# Patient Record
Sex: Female | Born: 1945 | Race: White | Hispanic: No | Marital: Married | Smoking: Former smoker
Health system: Southern US, Community
[De-identification: ages and names within clinical notes are randomized; demographics above are authoritative.]

## PROBLEM LIST (undated history)

## (undated) DIAGNOSIS — R413 Other amnesia: Secondary | ICD-10-CM

## (undated) DIAGNOSIS — F32A Depression, unspecified: Secondary | ICD-10-CM

## (undated) DIAGNOSIS — F329 Major depressive disorder, single episode, unspecified: Secondary | ICD-10-CM

## (undated) DIAGNOSIS — I10 Essential (primary) hypertension: Secondary | ICD-10-CM

## (undated) DIAGNOSIS — E785 Hyperlipidemia, unspecified: Secondary | ICD-10-CM

## (undated) DIAGNOSIS — N189 Chronic kidney disease, unspecified: Secondary | ICD-10-CM

## (undated) DIAGNOSIS — I639 Cerebral infarction, unspecified: Secondary | ICD-10-CM

## (undated) HISTORY — PX: CHOLECYSTECTOMY: SHX55

## (undated) HISTORY — PX: CATARACT EXTRACTION: SUR2

## (undated) HISTORY — PX: ANKLE ARTHODESIS W/ ARTHROSCOPY: SUR63

## (undated) HISTORY — PX: WRIST FRACTURE SURGERY: SHX121

## (undated) HISTORY — PX: ABDOMINAL HYSTERECTOMY: SHX81

---

## 1999-06-19 ENCOUNTER — Encounter: Admission: RE | Admit: 1999-06-19 | Discharge: 1999-09-17 | Payer: Self-pay | Admitting: Endocrinology

## 2000-11-17 ENCOUNTER — Other Ambulatory Visit: Admission: RE | Admit: 2000-11-17 | Discharge: 2000-11-17 | Payer: Self-pay | Admitting: Endocrinology

## 2003-06-24 ENCOUNTER — Ambulatory Visit (HOSPITAL_COMMUNITY): Admission: RE | Admit: 2003-06-24 | Discharge: 2003-06-24 | Payer: Self-pay | Admitting: Endocrinology

## 2003-09-12 ENCOUNTER — Encounter: Admission: RE | Admit: 2003-09-12 | Discharge: 2003-09-12 | Payer: Self-pay | Admitting: Endocrinology

## 2004-02-13 ENCOUNTER — Ambulatory Visit (HOSPITAL_COMMUNITY): Admission: RE | Admit: 2004-02-13 | Discharge: 2004-02-13 | Payer: Self-pay | Admitting: Orthopedic Surgery

## 2004-02-13 ENCOUNTER — Ambulatory Visit (HOSPITAL_BASED_OUTPATIENT_CLINIC_OR_DEPARTMENT_OTHER): Admission: RE | Admit: 2004-02-13 | Discharge: 2004-02-13 | Payer: Self-pay | Admitting: Orthopedic Surgery

## 2005-06-16 ENCOUNTER — Ambulatory Visit (HOSPITAL_COMMUNITY): Admission: RE | Admit: 2005-06-16 | Discharge: 2005-06-16 | Payer: Self-pay | Admitting: *Deleted

## 2005-06-16 ENCOUNTER — Encounter (INDEPENDENT_AMBULATORY_CARE_PROVIDER_SITE_OTHER): Payer: Self-pay | Admitting: Specialist

## 2005-09-22 ENCOUNTER — Encounter: Admission: RE | Admit: 2005-09-22 | Discharge: 2005-09-22 | Payer: Self-pay | Admitting: Endocrinology

## 2006-05-03 ENCOUNTER — Encounter: Admission: RE | Admit: 2006-05-03 | Discharge: 2006-08-01 | Payer: Self-pay | Admitting: Endocrinology

## 2006-09-13 ENCOUNTER — Encounter: Admission: RE | Admit: 2006-09-13 | Discharge: 2006-12-12 | Payer: Self-pay | Admitting: Endocrinology

## 2006-10-19 ENCOUNTER — Ambulatory Visit: Payer: Self-pay

## 2006-12-10 ENCOUNTER — Encounter: Admission: RE | Admit: 2006-12-10 | Discharge: 2006-12-10 | Payer: Self-pay | Admitting: Orthopedic Surgery

## 2008-05-07 ENCOUNTER — Encounter: Admission: RE | Admit: 2008-05-07 | Discharge: 2008-05-07 | Payer: Self-pay | Admitting: Endocrinology

## 2008-06-25 ENCOUNTER — Encounter (INDEPENDENT_AMBULATORY_CARE_PROVIDER_SITE_OTHER): Payer: Self-pay | Admitting: *Deleted

## 2008-06-25 ENCOUNTER — Ambulatory Visit (HOSPITAL_COMMUNITY): Admission: RE | Admit: 2008-06-25 | Discharge: 2008-06-25 | Payer: Self-pay | Admitting: *Deleted

## 2009-04-08 ENCOUNTER — Telehealth (INDEPENDENT_AMBULATORY_CARE_PROVIDER_SITE_OTHER): Payer: Self-pay | Admitting: *Deleted

## 2009-04-25 ENCOUNTER — Encounter: Admission: RE | Admit: 2009-04-25 | Discharge: 2009-04-25 | Payer: Self-pay | Admitting: Endocrinology

## 2009-05-13 ENCOUNTER — Other Ambulatory Visit: Admission: RE | Admit: 2009-05-13 | Discharge: 2009-05-13 | Payer: Self-pay | Admitting: Interventional Radiology

## 2009-05-13 ENCOUNTER — Encounter: Admission: RE | Admit: 2009-05-13 | Discharge: 2009-05-13 | Payer: Self-pay | Admitting: Endocrinology

## 2009-05-13 ENCOUNTER — Encounter (INDEPENDENT_AMBULATORY_CARE_PROVIDER_SITE_OTHER): Payer: Self-pay | Admitting: Interventional Radiology

## 2009-07-31 ENCOUNTER — Emergency Department (HOSPITAL_COMMUNITY): Admission: EM | Admit: 2009-07-31 | Discharge: 2009-07-31 | Payer: Self-pay | Admitting: Emergency Medicine

## 2009-08-06 ENCOUNTER — Ambulatory Visit (HOSPITAL_BASED_OUTPATIENT_CLINIC_OR_DEPARTMENT_OTHER): Admission: RE | Admit: 2009-08-06 | Discharge: 2009-08-06 | Payer: Self-pay | Admitting: Orthopedic Surgery

## 2009-08-14 ENCOUNTER — Inpatient Hospital Stay (HOSPITAL_COMMUNITY): Admission: EM | Admit: 2009-08-14 | Discharge: 2009-08-16 | Payer: Self-pay | Admitting: Emergency Medicine

## 2009-08-15 ENCOUNTER — Ambulatory Visit: Payer: Self-pay | Admitting: Vascular Surgery

## 2009-08-15 ENCOUNTER — Encounter (INDEPENDENT_AMBULATORY_CARE_PROVIDER_SITE_OTHER): Payer: Self-pay | Admitting: Internal Medicine

## 2010-03-24 ENCOUNTER — Encounter: Payer: Self-pay | Admitting: Cardiovascular Disease

## 2010-04-07 DIAGNOSIS — R079 Chest pain, unspecified: Secondary | ICD-10-CM

## 2010-04-07 DIAGNOSIS — E119 Type 2 diabetes mellitus without complications: Secondary | ICD-10-CM

## 2010-04-07 DIAGNOSIS — Z87898 Personal history of other specified conditions: Secondary | ICD-10-CM | POA: Insufficient documentation

## 2010-04-07 DIAGNOSIS — E785 Hyperlipidemia, unspecified: Secondary | ICD-10-CM | POA: Insufficient documentation

## 2010-04-07 DIAGNOSIS — M129 Arthropathy, unspecified: Secondary | ICD-10-CM | POA: Insufficient documentation

## 2010-04-07 DIAGNOSIS — G894 Chronic pain syndrome: Secondary | ICD-10-CM | POA: Insufficient documentation

## 2010-04-07 DIAGNOSIS — I10 Essential (primary) hypertension: Secondary | ICD-10-CM

## 2010-04-07 DIAGNOSIS — E669 Obesity, unspecified: Secondary | ICD-10-CM | POA: Insufficient documentation

## 2010-04-08 ENCOUNTER — Ambulatory Visit: Payer: Self-pay | Admitting: Cardiovascular Disease

## 2010-04-30 ENCOUNTER — Ambulatory Visit (HOSPITAL_BASED_OUTPATIENT_CLINIC_OR_DEPARTMENT_OTHER): Admission: RE | Admit: 2010-04-30 | Discharge: 2010-04-30 | Payer: Self-pay | Admitting: Orthopedic Surgery

## 2010-09-06 ENCOUNTER — Encounter: Payer: Self-pay | Admitting: Endocrinology

## 2010-09-06 ENCOUNTER — Encounter: Payer: Self-pay | Admitting: Orthopedic Surgery

## 2010-09-15 NOTE — Letter (Signed)
Summary: [facility] Progress Note  [facility] Progress Note   Imported By: Sallee Provencal 04/16/2010 14:19:55  _____________________________________________________________________  External Attachment:    Type:   Image     Comment:   External Document

## 2010-09-15 NOTE — Assessment & Plan Note (Signed)
Summary: NP6/SURGICAL CLEARANCE/JML   Visit Type:  Pre-op Evaluation/NP6 Primary Provider:  dr Wilson Singer  CC:  no cardiac complaints.  History of Present Illness: 65 yo WF with history of DM, HTN and hyperlipidemia here today for pre-operative risk assessment prior to planned left wrist surgery. She tells me that she broke her wrist in December of 2010 but had treatment with a splint. When leaving the surgeons office, she slipped on the ice and broke her left ankle. This led to an open reduction and internal fixation of the left ankle later in December. She was admitted to [facility] on August 14, 2010 with complaints of chest pain. She was seen by Dr. Doreatha Lew of [facility]. Cardiac enzymes were negative and EKG was without ischemic changes. Plan at time of discharge was for an outpatient stress test as the pain was felt to be atypical and likely non-cardiac. She cancelled the stress test appointment since she had no recurrent pain.   She has felt well since then without any chest pain, SOB, palpitations, near syncope or syncope. She has no prior cardiac problems. There is no family history of CAD. She has continued to have pain in her left wrist and has seen Dr. Percell Miller who is planning open surgery on her wrist.   Current Medications (verified): 1)  Crestor 10 Mg Tabs (Rosuvastatin Calcium) .Marland Kitchen.. 1 Tab Once Daily 2)  Diovan 40 Mg Tabs (Valsartan) .... Take One Tablet By Mouth Daily 3)  Topamax 25 Mg Tabs (Topiramate) .... 2 Tabs Qd 4)  Humulin 50/50 50-50 % Susp (Insulin Isophane & Regular) .... As Directed 5)  Aspirin 81 Mg Tbec (Aspirin) .... Take One Tablet By Mouth Daily  Allergies (verified): No Known Drug Allergies  Past History:  Past Medical History: Reviewed history from 04/07/2010 and no changes required. ARTHRITIS, CHRONIC (ICD-716.90) MIGRAINES, HX OF (ICD-V13.8) CHRONIC PAIN SYNDROME (ICD-338.4) OBESITY (ICD-278.00) HYPERLIPIDEMIA (ICD-272.4) HYPERTENSION  (ICD-401.9) DIABETES MELLITUS, TYPE II (ICD-250.00) CHEST PAIN (ICD-786.50)  Past Surgical History: Reviewed history from 04/07/2010 and no changes required. 1. Comminuted fracture of the fibula status post open reduction       internal fixation with screw.   2. Right knee surgery.   3. EGD and colonoscopy findings unknown.   4. Cholecystectomy.      Family History: Mother-deceased, lung cancer Father-deceased, ruptured esophagus NO CAD     Social History:  No tobacco No alcohol No illicit drugs Married, 2 children  Review of Systems  The patient denies fatigue, malaise, fever, weight gain/loss, vision loss, decreased hearing, hoarseness, chest pain, palpitations, shortness of breath, prolonged cough, wheezing, sleep apnea, coughing up blood, abdominal pain, blood in stool, nausea, vomiting, diarrhea, heartburn, incontinence, blood in urine, muscle weakness, joint pain, leg swelling, rash, skin lesions, headache, fainting, dizziness, depression, anxiety, enlarged lymph nodes, easy bruising or bleeding, and environmental allergies.    Vital Signs:  Patient profile:   65 year old female Height:      65 inches Weight:      213 pounds BMI:     35.57 Pulse rate:   78 / minute BP sitting:   150 / 80  (left arm) Cuff size:   large  Vitals Entered By: Lubertha Basque, CNA (April 08, 2010 11:27 AM)  Physical Exam  General:  General: Well developed, well nourished, NAD HEENT: OP clear, mucus membranes moist SKIN: warm, dry Neuro: No focal deficits Musculoskeletal: Muscle strength 5/5 all ext Psychiatric: Mood and affect normal Neck: No JVD, no  carotid bruits, no thyromegaly, no lymphadenopathy. Lungs:Clear bilaterally, no wheezes, rhonci, crackles CV: RRR no murmurs, gallops rubs Abdomen: soft, NT, ND, BS present Extremities: No edema, pulses 2+.    EKG  Procedure date:  04/08/2010  Findings:      Normal sinus rhythm, rate 78 bpm. LAFB.  Impression &  Recommendations:  Problem # 1:  PRE-OPERATIVE CARDIAC EXAM (ICD-V72.81) She has no complaints of chest pain or shortness of breath. She has several risk factors for CAD but no active symptoms. She has no prior history of CAD or tobacc abuse, no family history of CAD. Based on current guidelines, there is no indication for cardiac workup prior to the planned surgical procedure which is a low risk procedure. I would recommend proceeding with surgery without cardiac workup.   Her updated medication list for this problem includes:    Aspirin 81 Mg Tbec (Aspirin) .Marland Kitchen... Take one tablet by mouth daily  Patient Instructions: 1)  Your physician recommends that you schedule a follow-up appointment as needed 2)  Your physician recommends that you continue on your current medications as directed. Please refer to the Current Medication list given to you today.

## 2010-09-30 ENCOUNTER — Encounter: Payer: Self-pay | Admitting: Cardiovascular Disease

## 2010-10-07 NOTE — Miscellaneous (Signed)
Summary: Orders Update  Clinical Lists Changes  Orders: Added new Test order of Renal Artery Duplex (Renal Artery Duplex) - Signed 

## 2010-10-29 LAB — POCT HEMOGLOBIN-HEMACUE: Hemoglobin: 13.6 g/dL (ref 12.0–15.0)

## 2010-10-29 LAB — GLUCOSE, CAPILLARY
Glucose-Capillary: 102 mg/dL — ABNORMAL HIGH (ref 70–99)
Glucose-Capillary: 109 mg/dL — ABNORMAL HIGH (ref 70–99)

## 2010-11-01 LAB — CBC
HCT: 28.5 % — ABNORMAL LOW (ref 36.0–46.0)
Hemoglobin: 9.5 g/dL — ABNORMAL LOW (ref 12.0–15.0)
MCHC: 33.3 g/dL (ref 30.0–36.0)
MCV: 87.1 fL (ref 78.0–100.0)
Platelets: 202 10*3/uL (ref 150–400)
RBC: 3.27 MIL/uL — ABNORMAL LOW (ref 3.87–5.11)
RDW: 14.1 % (ref 11.5–15.5)
WBC: 5.6 10*3/uL (ref 4.0–10.5)

## 2010-11-01 LAB — GLUCOSE, CAPILLARY
Glucose-Capillary: 201 mg/dL — ABNORMAL HIGH (ref 70–99)
Glucose-Capillary: 253 mg/dL — ABNORMAL HIGH (ref 70–99)

## 2010-11-16 LAB — BASIC METABOLIC PANEL
BUN: 20 mg/dL (ref 6–23)
CO2: 24 mEq/L (ref 19–32)
Calcium: 8.8 mg/dL (ref 8.4–10.5)
Chloride: 96 mEq/L (ref 96–112)
Creatinine, Ser: 1.25 mg/dL — ABNORMAL HIGH (ref 0.4–1.2)
GFR calc Af Amer: 52 mL/min — ABNORMAL LOW (ref >60)
GFR calc non Af Amer: 43 mL/min — ABNORMAL LOW (ref >60)
Glucose, Bld: 519 mg/dL (ref 70–99)
Potassium: 4.6 mEq/L (ref 3.5–5.1)
Sodium: 132 mEq/L — ABNORMAL LOW (ref 135–145)

## 2010-11-16 LAB — DIFFERENTIAL
Eosinophils Absolute: 0.2 10*3/uL (ref 0.0–0.7)
Eosinophils Relative: 3 % (ref 0–5)
Lymphs Abs: 1.3 10*3/uL (ref 0.7–4.0)
Monocytes Absolute: 0.4 10*3/uL (ref 0.1–1.0)
Monocytes Relative: 6 % (ref 3–12)

## 2010-11-16 LAB — GLUCOSE, CAPILLARY
Glucose-Capillary: 181 mg/dL — ABNORMAL HIGH (ref 70–99)
Glucose-Capillary: 256 mg/dL — ABNORMAL HIGH (ref 70–99)
Glucose-Capillary: 265 mg/dL — ABNORMAL HIGH (ref 70–99)
Glucose-Capillary: 313 mg/dL — ABNORMAL HIGH (ref 70–99)
Glucose-Capillary: 331 mg/dL — ABNORMAL HIGH (ref 70–99)
Glucose-Capillary: 394 mg/dL — ABNORMAL HIGH (ref 70–99)
Glucose-Capillary: 400 mg/dL — ABNORMAL HIGH (ref 70–99)

## 2010-11-16 LAB — POCT I-STAT, CHEM 8
BUN: 28 mg/dL — ABNORMAL HIGH (ref 6–23)
Calcium, Ion: 1.15 mmol/L (ref 1.12–1.32)
Chloride: 103 mEq/L (ref 96–112)
Creatinine, Ser: 1.2 mg/dL (ref 0.4–1.2)
Glucose, Bld: 291 mg/dL — ABNORMAL HIGH (ref 70–99)
HCT: 36 % (ref 36.0–46.0)
Hemoglobin: 12.2 g/dL (ref 12.0–15.0)
Potassium: 4.6 mEq/L (ref 3.5–5.1)
Sodium: 133 mEq/L — ABNORMAL LOW (ref 135–145)
TCO2: 24 mmol/L (ref 0–100)

## 2010-11-16 LAB — URINALYSIS, ROUTINE W REFLEX MICROSCOPIC
Bilirubin Urine: NEGATIVE
Glucose, UA: >1000 mg/dL — AB
Ketones, ur: NEGATIVE mg/dL
Leukocytes, UA: NEGATIVE
Nitrite: NEGATIVE
Specific Gravity, Urine: >1.046 — ABNORMAL HIGH (ref 1.005–1.030)
pH: 5.5 (ref 5.0–8.0)

## 2010-11-16 LAB — POCT CARDIAC MARKERS
CKMB, poc: 1.2 ng/mL (ref 1.0–8.0)
CKMB, poc: 1.7 ng/mL (ref 1.0–8.0)
Myoglobin, poc: 214 ng/mL (ref 12–200)
Troponin i, poc: <0.05 ng/mL (ref 0.00–0.09)
Troponin i, poc: <0.05 ng/mL (ref 0.00–0.09)

## 2010-11-16 LAB — CBC
HCT: 32.6 % — ABNORMAL LOW (ref 36.0–46.0)
Hemoglobin: 11.1 g/dL — ABNORMAL LOW (ref 12.0–15.0)
MCHC: 34 g/dL (ref 30.0–36.0)
MCV: 86.7 fL (ref 78.0–100.0)
MCV: 86.7 fL (ref 78.0–100.0)
Platelets: 238 10*3/uL (ref 150–400)
Platelets: 256 10*3/uL (ref 150–400)
RBC: 3.61 MIL/uL — ABNORMAL LOW (ref 3.87–5.11)
RBC: 3.76 MIL/uL — ABNORMAL LOW (ref 3.87–5.11)
RDW: 14 % (ref 11.5–15.5)
WBC: 6.4 10*3/uL (ref 4.0–10.5)
WBC: 6.4 10*3/uL (ref 4.0–10.5)

## 2010-11-16 LAB — HEMOGLOBIN A1C
Hgb A1c MFr Bld: 10.8 % — ABNORMAL HIGH (ref 4.6–6.1)
Mean Plasma Glucose: 263 mg/dL

## 2010-11-16 LAB — URINE MICROSCOPIC-ADD ON

## 2010-11-16 LAB — URINE CULTURE: Colony Count: >=100000

## 2010-11-16 LAB — CARDIAC PANEL(CRET KIN+CKTOT+MB+TROPI)
CK, MB: 1.2 ng/mL (ref 0.3–4.0)
CK, MB: 1.2 ng/mL (ref 0.3–4.0)
Relative Index: INVALID (ref 0.0–2.5)
Total CK: 84 U/L (ref 7–177)
Troponin I: 0.01 ng/mL (ref 0.00–0.06)
Troponin I: 0.01 ng/mL (ref 0.00–0.06)

## 2010-12-29 NOTE — Op Note (Signed)
NAME:  Tamara Fry, POZO                    ACCOUNT NO.:  [id number]   MEDICAL RECORD NO.:  [id number]          PATIENT TYPE:  AMB   LOCATION:  ENDO                         FACILITY:  [facility]   PHYSICIAN:  Waverly Ferrari, M.D.    DATE OF BIRTH:  10-Jan-1946   DATE OF PROCEDURE:  06/25/2008  DATE OF DISCHARGE:                               OPERATIVE REPORT   PROCEDURE:  Colonoscopy.   INDICATIONS:  Hemoccult positivity.   ANESTHESIA:  Fentanyl 50 mcg, Versed 3 mg.   DESCRIPTION OF PROCEDURE:  With the patient mildly sedated in the left  lateral decubitus position the Pentax videoscopic colonoscope was  inserted into the rectum and passed under direct vision with pressure  applied to reach the cecum identified by ileocecal valve and appendiceal  orifice both of which were photographed.  From this point the  colonoscope was slowly withdrawn taking circumferential views of colonic  mucosa stopping in the rectum which appeared normal on direct and showed  hemorrhoids on retroflex view.  The endoscope was straightened and  withdrawn.  The patient's vital signs and pulse oximeter remained  stable.  The patient tolerated the procedure well without apparent  complications.   FINDINGS:  Internal hemorrhoids, otherwise unremarkable examination.   PLAN:  Consider repeat examination in 5-10 years.  See endoscopy note  for further details.           ______________________________  Waverly Ferrari, M.D.     GMO/MEDQ  D:  06/25/2008  T:  06/25/2008  Job:  [id number]

## 2010-12-29 NOTE — Op Note (Signed)
NAME:  Tamara Fry, Tamara Fry                    ACCOUNT NO.:  [id number]   MEDICAL RECORD NO.:  [id number]          PATIENT TYPE:  AMB   LOCATION:  ENDO                         FACILITY:  [facility]   PHYSICIAN:  Waverly Ferrari, M.D.    DATE OF BIRTH:  August 07, 1946   DATE OF PROCEDURE:  DATE OF DISCHARGE:                               OPERATIVE REPORT   PROCEDURE:  Upper endoscopy with biopsy.   INDICATIONS:  Hemoccult positivity.   ANESTHESIA:  Fentanyl 25 mcg, Versed 6 mg.   PROCEDURE:  With the patient mildly sedated in the left lateral  decubitus position, the Pentax videoscopic endoscope was inserted into  the mouth, passed under direct vision through the esophagus, which  appeared normal into the stomach.  The fundus appeared erythematous  diffusely, but body and antrum were spared.  Photographs were taken.  The duodenal bulb and second portion of duodenum were visualized and I  questioned scalloping of the second portion of duodenum, so I elected to  biopsy this.  From this point, the endoscope was slowly withdrawn,  taking circumferential views of duodenal mucosa until the endoscope had  been pulled back into stomach and placed in retroflexion to view the  stomach from below.  The endoscope was straightened and withdrawn,  taking biopsies of the fundus erythematous areas as we withdrew, taking  circumferential views of remaining gastric and esophageal mucosa.  The  patient's vital signs and pulse oximeter remained stable.  The patient  tolerated the procedure well without apparent complications.   FINDINGS:  Question of scalloping of the duodenum biopsied.  Erythematous changes of fundus of stomach biopsied.  Await biopsy  reports.  The patient will call me for results and follow up with me as  an outpatient.  Proceed to colonoscopy as planned.           ______________________________  Waverly Ferrari, M.D.     GMO/MEDQ  D:  06/25/2008  T:  06/25/2008  Job:  [id number]

## 2011-01-01 NOTE — Op Note (Signed)
NAME:  Tamara Fry, Tamara Fry                    ACCOUNT NO.:  [id number]   MEDICAL RECORD NO.:  [id number]          PATIENT TYPE:  AMB   LOCATION:  ENDO                         FACILITY[facility]   PHYSICIAN:  Waverly Ferrari, M.D.    DATE OF BIRTH:  12-19-1945   DATE OF PROCEDURE:  06/16/2005  DATE OF DISCHARGE:                                 OPERATIVE REPORT   PROCEDURE:  Upper endoscopy.   INDICATIONS:  Gastroesophageal reflux disease.   ANESTHESIA:  Demerol 60, Versed 6 mg.   DESCRIPTION OF PROCEDURE:  With the patient mildly sedated in the left  lateral decubitus position, the Olympus videoscopic endoscope was inserted  in the mouth and passed under direct vision through the esophagus which  appeared normal into the stomach. The fundus, body, antrum, duodenal bulb,  second portion of duodenum were visualized. From this point, the endoscope  was slowly withdrawn taking circumferential views of the duodenal mucosa  until the endoscope was then pulled back into the stomach placed in  retroflexion to view the stomach from below. The endoscope was straightened  and withdrawn taking circumferential views of the remaining gastric and  esophageal mucosa. The patient's vital signs and pulse oximeter remained  stable. The patient tolerated the procedure well without apparent  complications.   FINDINGS:  Unremarkable examination.   PLAN:  Proceed to colonoscopy.           ______________________________  Waverly Ferrari, M.D.     GMO/MEDQ  D:  06/16/2005  T:  06/16/2005  Job:  [id number]

## 2011-01-01 NOTE — Op Note (Signed)
NAME:  Tamara Fry, Tamara Fry                              ACCOUNT NO.:  [id number]   MEDICAL RECORD NO.:  [id number]                   PATIENT TYPE:  AMB   LOCATION:  DSC                                  FACILITY:  [facility]   PHYSICIAN:  Ninetta Lights, M.D.              DATE OF BIRTH:  [birth year]   DATE OF PROCEDURE:  02/13/2004  DATE OF DISCHARGE:                                 OPERATIVE REPORT   PREOPERATIVE DIAGNOSES:  Right knee lateral and medial meniscus tear with  chondromalacia of the patella.   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   PROCEDURE:  Right knee exam under anesthesia, arthroscopy, partial lateral  meniscectomy.  Removal of chondral fragments and osteochondral loose bodies.  Partial synovectomy, chondroplasty medial femoral condyle and patella.   SURGEON:  Ninetta Lights, M.D.   ASSISTANT:  Aaron Edelman D. Petrarca, P.A.-C.   ANESTHESIA:  General.   ESTIMATED BLOOD LOSS:  Minimal.   TOURNIQUET TIME:  Not employed.   SPECIMENS:  None.   CULTURES:  None.   COMPLICATIONS:  None.   DRESSING:  Sterile compressive.   DESCRIPTION OF PROCEDURE:  The patient was brought to the operating room,  placed on the operating table in supine position.  After adequate anesthesia  had been obtained, tourniquet and leg holder applied, leg prepped and draped  in the usual sterile fashion.  Three portals were created, one superolateral  and one each medial and lateral parapatellar.  Inflow catheter introduced,  knee distended, arthroscope introduced.  Marked proliferative reactive  synovitis throughout treated with synovectomy,  hemostasis cautery.  Grade 2  changes lateral patella and medial trochlea through the chondroplasty to a  stable surface. Some lateral tracking but did not tether significantly. The  cruciate ligament intact. The lateral  meniscus marked, complex displaced  tears at the anterior half. Taken out to the rim and tapered into the  remaining meniscus, hemostasis at the margin.  The lateral compartment had  minimal degenerative changes. Medially there were two osteochondral loose  bodies found posteromedially that were removed.  A focal 1 cm near full  thickness defect on the condyle very much on the margin __________ to a  stable surface.  The medial meniscus was thoroughly inspected and no tears  seen.  A little roughening on both the medial and tibial plateaus debrided.  At completion, all recess examined to be sure all loose bodies removed.  Instruments  and fluid removed.  Portals of the knee injected with Marcaine.  Portals  closed with 4-0 nylon. A sterile compressive dressing applied. Anesthesia  reversed.  Brought to the recovery room.  Tolerated surgery well with no  complications.  Ninetta Lights, M.D.    DFM/MEDQ  D:  02/13/2004  T:  02/13/2004  Job:  [id number]

## 2011-01-01 NOTE — Op Note (Signed)
NAME:  Tamara Fry, Tamara Fry                    ACCOUNT NO.:  [id number]   MEDICAL RECORD NO.:  [id number]          PATIENT TYPE:  AMB   LOCATION:  ENDO                         [facility]ACILITY:  [facility]   PHYSICIAN:  Waverly Ferrari, M.D.    DATE OF BIRTH:  [birth year]   DATE OF PROCEDURE:  06/16/2005  DATE OF DISCHARGE:                                 OPERATIVE REPORT   PROCEDURE:  Colonoscopy.   INDICATIONS:  Colon polyps.   ANESTHESIA:  Demerol 20, Versed 2 mg.   DESCRIPTION OF PROCEDURE:  With the patient mildly sedated in the left  lateral decubitus position, the Olympus videoscopic colonoscope was inserted  in the rectum and passed under direct vision to the cecum identified by the  ileocecal valve and appendiceal orifice both of which were photographed.  From this point, the colonoscope was slowly withdrawn taking circumferential  views of the colonic mucosa stopping in the rectum where a polyp was seen,  photographed and removed using hot biopsy forceps technique setting of  20/200 blended current. We next placed the endoscope in retroflexion to view  the anal canal from above. The endoscope was then straightened and  withdrawn. The patient's vital signs and pulse oximeter remained stable. The  patient tolerated the procedure well without apparent complications.   FINDINGS:  Internal hemorrhoids, polyp in the rectum. Await biopsy report.  The patient will call me for results and follow-up with me as an outpatient.           ______________________________  Waverly Ferrari, M.D.     GMO/MEDQ  D:  06/16/2005  T:  06/16/2005  Job:  [id number]

## 2011-03-15 ENCOUNTER — Emergency Department (HOSPITAL_BASED_OUTPATIENT_CLINIC_OR_DEPARTMENT_OTHER)
Admission: EM | Admit: 2011-03-15 | Discharge: 2011-03-15 | Disposition: A | Discharge status: Home / Self Care | Payer: Medicare Other | Attending: Emergency Medicine | Admitting: Emergency Medicine

## 2011-03-15 ENCOUNTER — Encounter: Payer: Self-pay | Admitting: *Deleted

## 2011-03-15 DIAGNOSIS — S90416A Abrasion, unspecified lesser toe(s), initial encounter: Secondary | ICD-10-CM

## 2011-03-15 DIAGNOSIS — E119 Type 2 diabetes mellitus without complications: Secondary | ICD-10-CM | POA: Insufficient documentation

## 2011-03-15 DIAGNOSIS — Y92009 Unspecified place in unspecified non-institutional (private) residence as the place of occurrence of the external cause: Secondary | ICD-10-CM | POA: Insufficient documentation

## 2011-03-15 DIAGNOSIS — IMO0002 Reserved for concepts with insufficient information to code with codable children: Secondary | ICD-10-CM | POA: Insufficient documentation

## 2011-03-15 DIAGNOSIS — I10 Essential (primary) hypertension: Secondary | ICD-10-CM | POA: Insufficient documentation

## 2011-03-15 DIAGNOSIS — W269XXA Contact with unspecified sharp object(s), initial encounter: Secondary | ICD-10-CM | POA: Insufficient documentation

## 2011-03-15 HISTORY — DX: Essential (primary) hypertension: I10

## 2011-03-15 MED ORDER — SULFAMETHOXAZOLE-TRIMETHOPRIM 800-160 MG PO TABS: 1.0000 | ORAL_TABLET | Freq: Two times a day (BID) | ORAL | Status: AC

## 2011-03-15 MED ORDER — HYDROCODONE-ACETAMINOPHEN 5-325 MG PO TABS: 2.0000 | ORAL_TABLET | ORAL | Status: AC | PRN

## 2011-03-15 NOTE — ED Provider Notes (Addendum)
History     Chief Complaint  Patient presents with  . Insect Bite   HPI Comments: Pt reports she was walking thru straw in a plant bed at [place] and she thinks something bite her.  Pt did not see a snake.  Pt is worried about her foot because she is diabetic,  Pt was wearing flip flops.  Patient is a 65 y.o. female presenting with toe pain. The history is provided by the patient.  Toe Pain This is a new problem. The current episode started today. The problem has been unchanged. Pertinent negatives include no joint swelling. The symptoms are aggravated by nothing. She has tried nothing for the symptoms. The treatment provided no relief.  Toe Pain This is a new problem. The current episode started today. The problem has been unchanged. The symptoms are aggravated by nothing. She has tried nothing for the symptoms. The treatment provided no relief.    Past Medical History  Diagnosis Date  . Diabetes mellitus   . Hypertension   . Migraine     Past Surgical History  Procedure Date  . Ankle arthodesis w/ arthroscopy   . Tonsillectomy   . Abdominal hysterectomy     History reviewed. No pertinent family history.  History  Substance Use Topics  . Smoking status: Never Smoker   . Smokeless tobacco: Not on file  . Alcohol Use: No    OB History    Grav Para Term Preterm Abortions TAB SAB Ect Mult Living                  Review of Systems  Musculoskeletal: Negative for joint swelling and gait problem.  Skin: Positive for wound.  All other systems reviewed and are negative.    Physical Exam  BP 156/76  Pulse 77  Temp(Src) 98.7 F (37.1 C) (Oral)  Resp 18  Wt 214 lb (97.07 kg)  SpO2 100%  Physical Exam  Constitutional: She appears well-developed and well-nourished.  HENT:  Head: Normocephalic.  Skin: Skin is warm. There is erythema.  Psychiatric: She has a normal mood and affect.    ED Course  Procedures  MDM Pt has 2 small abraided areas,  Probable superficial  abrasion,  I doubt bite,  No swelling (I doubt snake or insect)   Medical screening examination/treatment/procedure(s) were performed by non-physician practitioner and as supervising physician I was immediately available for consultation/collaboration. Katy Apo, M.D.    [place], [place] 03/15/11 [place] III, MD 03/16/11 [place], MD 04/23/11 [phone]

## 2011-03-15 NOTE — ED Notes (Signed)
Pt c/o insect bite right foot x 1 day.

## 2011-05-18 LAB — BASIC METABOLIC PANEL
BUN: 19
CO2: 22
Chloride: 100
Creatinine, Ser: 1.42 — ABNORMAL HIGH

## 2011-07-15 ENCOUNTER — Emergency Department (HOSPITAL_COMMUNITY): Payer: Medicare Other

## 2011-07-15 ENCOUNTER — Encounter (HOSPITAL_COMMUNITY): Payer: Self-pay | Admitting: Family Medicine

## 2011-07-15 ENCOUNTER — Emergency Department (HOSPITAL_COMMUNITY)
Admission: EM | Admit: 2011-07-15 | Discharge: 2011-07-15 | Disposition: A | Discharge status: Home / Self Care | Payer: Medicare Other | Attending: Emergency Medicine | Admitting: Emergency Medicine

## 2011-07-15 DIAGNOSIS — S8000XA Contusion of unspecified knee, initial encounter: Secondary | ICD-10-CM | POA: Insufficient documentation

## 2011-07-15 DIAGNOSIS — E860 Dehydration: Secondary | ICD-10-CM

## 2011-07-15 DIAGNOSIS — Z79899 Other long term (current) drug therapy: Secondary | ICD-10-CM | POA: Insufficient documentation

## 2011-07-15 DIAGNOSIS — R55 Syncope and collapse: Secondary | ICD-10-CM

## 2011-07-15 DIAGNOSIS — R5381 Other malaise: Secondary | ICD-10-CM | POA: Insufficient documentation

## 2011-07-15 DIAGNOSIS — S8001XA Contusion of right knee, initial encounter: Secondary | ICD-10-CM

## 2011-07-15 DIAGNOSIS — W19XXXA Unspecified fall, initial encounter: Secondary | ICD-10-CM | POA: Insufficient documentation

## 2011-07-15 DIAGNOSIS — R51 Headache: Secondary | ICD-10-CM | POA: Insufficient documentation

## 2011-07-15 DIAGNOSIS — E119 Type 2 diabetes mellitus without complications: Secondary | ICD-10-CM | POA: Insufficient documentation

## 2011-07-15 DIAGNOSIS — I10 Essential (primary) hypertension: Secondary | ICD-10-CM | POA: Insufficient documentation

## 2011-07-15 LAB — CBC
HCT: 38.7 % (ref 36.0–46.0)
MCH: 29.5 pg (ref 26.0–34.0)
MCHC: 32.6 g/dL (ref 30.0–36.0)
MCV: 90.6 fL (ref 78.0–100.0)
RDW: 13.8 % (ref 11.5–15.5)

## 2011-07-15 LAB — URINALYSIS, ROUTINE W REFLEX MICROSCOPIC
Glucose, UA: NEGATIVE mg/dL
Ketones, ur: NEGATIVE mg/dL
Leukocytes, UA: NEGATIVE
Protein, ur: NEGATIVE mg/dL

## 2011-07-15 LAB — DIFFERENTIAL
Basophils Absolute: 0 10*3/uL (ref 0.0–0.1)
Basophils Relative: 0 % (ref 0–1)
Eosinophils Absolute: 0 10*3/uL (ref 0.0–0.7)
Eosinophils Relative: 0 % (ref 0–5)
Monocytes Absolute: 0.6 10*3/uL (ref 0.1–1.0)

## 2011-07-15 LAB — BASIC METABOLIC PANEL
CO2: 27 mEq/L (ref 19–32)
Calcium: 9.4 mg/dL (ref 8.4–10.5)
Creatinine, Ser: 1.35 mg/dL — ABNORMAL HIGH (ref 0.50–1.10)
GFR calc non Af Amer: 40 mL/min — ABNORMAL LOW (ref >90)

## 2011-07-15 MED ORDER — SODIUM CHLORIDE 0.9 % IV SOLN
INTRAVENOUS | Status: DC
  Administered 2011-07-15: 500 mL via INTRAVENOUS

## 2011-07-15 MED ORDER — SODIUM CHLORIDE 0.9 % IV BOLUS (SEPSIS)
500.0000 mL | Freq: Once | INTRAVENOUS | Status: AC
  Administered 2011-07-15: 500 mL via INTRAVENOUS

## 2011-07-15 NOTE — ED Notes (Signed)
Pt returned from xray. NAD noted at this time.

## 2011-07-15 NOTE — ED Provider Notes (Addendum)
History     CSN: [id number] Arrival date & time: 07/15/2011  9:11 AM   First MD Initiated Contact with Patient 07/15/11 [phone]      Chief Complaint  Patient presents with  . Headache    (Consider location/radiation/quality/duration/timing/severity/associated sxs/prior treatment) Patient is a 65 y.o. female presenting with weakness.  Weakness The primary symptoms include headaches (She had a headache this morning when her blood pressure was elevated). Primary symptoms do not include loss of consciousness, altered mental status, seizures, dizziness, focal weakness, loss of sensation or fever. Episode onset: Weakness characterized as fatigue for 2 years. Episode duration: Weakness waxes and wanes. The symptoms are unchanged. The neurological symptoms are diffuse. Context: No known provocation.  The headache is associated with weakness. The headache is not associated with photophobia or loss of balance.  Additional symptoms include weakness. Additional symptoms do not include pain, lower back pain, loss of balance, photophobia, hallucinations, nystagmus, vertigo, anxiety, irritability or dysphoric mood. Medical issues do not include seizures, cerebral vascular accident or recent surgery. Associated medical issues comments: Today she felt near syncopal.. Procedure history comments:  She has been evaluated in the office by her PCP.   After the near-syncope today, she checked her blood pressure  and it was high, 200/100. Has been under stress due to to her husband's chronic illness. He is a total care patient, and lives in her home. Last week. She had syncope x2. This caused her to fall to the floor. She injured her left knee in the fall. Her doctor about the syncope, and he checked her blood sugar, but did not otherwise intervene.  Past Medical History  Diagnosis Date  . Diabetes mellitus   . Hypertension   . Migraine     Past Surgical History  Procedure Date  . Ankle arthodesis w/  arthroscopy   . Tonsillectomy   . Abdominal hysterectomy     History reviewed. No pertinent family history.  History  Substance Use Topics  . Smoking status: Never Smoker   . Smokeless tobacco: Not on file  . Alcohol Use: No    OB History    Grav Para Term Preterm Abortions TAB SAB Ect Mult Living                  Review of Systems  Constitutional: Negative for fever and irritability.  Eyes: Negative for photophobia.  Neurological: Positive for weakness and headaches (She had a headache this morning when her blood pressure was elevated). Negative for dizziness, vertigo, focal weakness, seizures, loss of consciousness and loss of balance.  Psychiatric/Behavioral: Negative for hallucinations, dysphoric mood and altered mental status.  All other systems reviewed and are negative.    Allergies  Review of patient's allergies indicates no known allergies.  Home Medications   Current Outpatient Rx  Name Route Sig Dispense Refill  . DESVENLAFAXINE SUCCINATE 50 MG PO TB24 Oral Take 50 mg by mouth every evening.      . FENOFIBRATE 48 MG PO TABS Oral Take 48 mg by mouth daily.      . TOPIRAMATE 25 MG PO TABS Oral Take 25 mg by mouth at bedtime.      . TRAMADOL HCL 50 MG PO TABS Oral Take 50 mg by mouth 4 (four) times daily as needed. For pain. Maximum dose= 8 tablets per day     . VALSARTAN 160 MG PO TABS Oral Take 160 mg by mouth daily.      . INSULIN GLARGINE 100 UNIT/ML [place]  SOLN Subcutaneous Inject 70 Units into the skin 2 (two) times daily. 70 units in the morning and 60 units at night      BP 132/57  Pulse 80  Temp(Src) 98.7 F (37.1 C) (Oral)  Resp 12  SpO2 100%  Physical Exam  Constitutional: She is oriented to person, place, and time. She appears well-developed and well-nourished.  HENT:  Head: Normocephalic and atraumatic.  Eyes: Conjunctivae are normal. Pupils are equal, round, and reactive to light.  Neck: Normal range of motion. Neck supple.  Cardiovascular:  Normal rate and regular rhythm.   Pulmonary/Chest: Effort normal and breath sounds normal.  Abdominal: Soft. Bowel sounds are normal.  Musculoskeletal: She exhibits tenderness (Left knee, tender, and swollen  with decreased motion due to pain). She exhibits no edema.  Neurological: She is alert and oriented to person, place, and time. No cranial nerve deficit. She exhibits normal muscle tone.  Skin: Skin is warm and dry.  Psychiatric: She has a normal mood and affect. Judgment and thought content normal.    ED Course  Procedures (including critical care time)  Date: 07/15/2011  Rate: 64  Rhythm: normal sinus rhythm  QRS Axis: normal  Intervals: normal  ST/T Wave abnormalities: normal  Conduction Disutrbances:none  Narrative Interpretation: LVH  Old EKG Reviewed: unchanged  Labs Reviewed  BASIC METABOLIC PANEL - Abnormal; Notable for the following:    Glucose, Bld 167 (*)    BUN 30 (*)    Creatinine, Ser 1.35 (*)    GFR calc non Af Amer 40 (*)    GFR calc Af Amer 46 (*)    All other components within normal limits  CBC  DIFFERENTIAL  URINALYSIS, ROUTINE W REFLEX MICROSCOPIC  URINE CULTURE   Dg Chest 2 View  07/15/2011  *RADIOLOGY REPORT*  Clinical Data: [place].  Weakness.  CHEST - 2 VIEW  Comparison: Chest x-Ramaswamy 07/05/2011.  Findings: The cardiac silhouette, mediastinal and hilar contours are within normal limits and stable.  The lungs are clear.  The bony thorax is intact.  IMPRESSION: No acute cardiopulmonary findings.  Original Report Authenticated By: P. Kalman Jewels, M.D.   Dg Knee Complete 4 Views Left  07/15/2011  *RADIOLOGY REPORT*  Clinical Data: [place].  Left knee pain.  LEFT KNEE - COMPLETE 4+ VIEW  Comparison: Left knee radiographs 07/05/2011.  Findings: There are mild stable degenerative changes.  No acute fracture or osteochondral abnormality.  No joint effusion.  IMPRESSION: No acute bony findings.  Original Report Authenticated By: P. Kalman Jewels, M.D.    Reevaluation: After treatment, IV fluids; patient feels better. She states she has only minimal headache at this time. Her daughter-in-law is with her. Knee sleeve ordered.   1. Syncope   2. Contusion of right knee   3. Dehydration       MDM  Reported syncope , without evident acute infection, metabolic disorder or acute coronary syndrome. She has previously had syncope, and been evaluated for it, most recently by her PCP last week.  Patient stable for discharge with outpatient management. Regarding the right knee injury in the fall several days ago, there is no fracture.    Richarda Blade, MD 07/15/11 [place], MD 07/15/11 [phone]

## 2011-07-15 NOTE — ED Notes (Signed)
Pt is A/O x4. Skin warm and dry. Respirations even and unlabored. NAD noted at this time.

## 2011-07-15 NOTE — ED Notes (Signed)
Per EMS: pt from home lives alone. Reports headache starting at 07:00 this morning and weakness for the past 6 months. No N/V or chest pain, no vision problems. NAD

## 2011-07-15 NOTE — ED Notes (Signed)
[id number] Expected date:07/15/11<BR> Expected time: 9:06 AM<BR> Means of arrival:Ambulance<BR> Comments:<BR>

## 2011-07-15 NOTE — ED Notes (Signed)
Jon, ortho tech notified of need for knee sleeve.

## 2011-07-16 LAB — URINE CULTURE
Colony Count: NO GROWTH
Culture: NO GROWTH

## 2011-07-31 ENCOUNTER — Other Ambulatory Visit: Payer: Self-pay

## 2011-07-31 ENCOUNTER — Emergency Department (HOSPITAL_COMMUNITY)
Admission: EM | Admit: 2011-07-31 | Discharge: 2011-07-31 | Disposition: A | Discharge status: Home / Self Care | Payer: Medicare Other | Attending: Emergency Medicine | Admitting: Emergency Medicine

## 2011-07-31 ENCOUNTER — Encounter (HOSPITAL_COMMUNITY): Payer: Self-pay | Admitting: Family Medicine

## 2011-07-31 ENCOUNTER — Emergency Department (HOSPITAL_COMMUNITY): Payer: Medicare Other

## 2011-07-31 DIAGNOSIS — Z794 Long term (current) use of insulin: Secondary | ICD-10-CM | POA: Insufficient documentation

## 2011-07-31 DIAGNOSIS — Z79899 Other long term (current) drug therapy: Secondary | ICD-10-CM | POA: Insufficient documentation

## 2011-07-31 DIAGNOSIS — E119 Type 2 diabetes mellitus without complications: Secondary | ICD-10-CM | POA: Insufficient documentation

## 2011-07-31 DIAGNOSIS — R079 Chest pain, unspecified: Secondary | ICD-10-CM | POA: Insufficient documentation

## 2011-07-31 DIAGNOSIS — R42 Dizziness and giddiness: Secondary | ICD-10-CM | POA: Insufficient documentation

## 2011-07-31 DIAGNOSIS — Z7982 Long term (current) use of aspirin: Secondary | ICD-10-CM | POA: Insufficient documentation

## 2011-07-31 DIAGNOSIS — I10 Essential (primary) hypertension: Secondary | ICD-10-CM | POA: Insufficient documentation

## 2011-07-31 LAB — COMPREHENSIVE METABOLIC PANEL
ALT: 15 U/L (ref 0–35)
AST: 21 U/L (ref 0–37)
CO2: 24 mEq/L (ref 19–32)
Chloride: 106 mEq/L (ref 96–112)
GFR calc non Af Amer: 44 mL/min — ABNORMAL LOW (ref >90)
Potassium: 3.8 mEq/L (ref 3.5–5.1)
Sodium: 141 mEq/L (ref 135–145)
Total Bilirubin: 0.4 mg/dL (ref 0.3–1.2)

## 2011-07-31 LAB — CBC
Hemoglobin: 12.4 g/dL (ref 12.0–15.0)
MCH: 29.7 pg (ref 26.0–34.0)
MCV: 89.9 fL (ref 78.0–100.0)
RBC: 4.17 MIL/uL (ref 3.87–5.11)

## 2011-07-31 LAB — CARDIAC PANEL(CRET KIN+CKTOT+MB+TROPI): Relative Index: 3.6 — ABNORMAL HIGH (ref 0.0–2.5)

## 2011-07-31 MED ORDER — FAMOTIDINE IN NACL 20-0.9 MG/50ML-% IV SOLN
20.0000 mg | Freq: Once | INTRAVENOUS | Status: AC
  Administered 2011-07-31: 20 mg via INTRAVENOUS
  Filled 2011-07-31: qty 50

## 2011-07-31 MED ORDER — SODIUM CHLORIDE 0.9 % IV SOLN
999.0000 mL | Freq: Once | INTRAVENOUS | Status: AC
  Administered 2011-07-31: 1000 mL via INTRAVENOUS

## 2011-07-31 MED ORDER — MORPHINE SULFATE 4 MG/ML IJ SOLN
4.0000 mg | Freq: Once | INTRAMUSCULAR | Status: AC
  Administered 2011-07-31: 4 mg via INTRAVENOUS
  Filled 2011-07-31: qty 1

## 2011-07-31 MED ORDER — ASPIRIN 81 MG PO CHEW
324.0000 mg | CHEWABLE_TABLET | Freq: Once | ORAL | Status: AC
  Administered 2011-07-31: 324 mg via ORAL
  Filled 2011-07-31: qty 4

## 2011-07-31 MED ORDER — FAMOTIDINE 20 MG PO TABS: 20.0000 mg | ORAL_TABLET | Freq: Two times a day (BID) | ORAL | Status: DC

## 2011-07-31 MED ORDER — GI COCKTAIL ~~LOC~~
30.0000 mL | Freq: Once | ORAL | Status: AC
  Administered 2011-07-31: 30 mL via ORAL
  Filled 2011-07-31: qty 30

## 2011-07-31 NOTE — ED Notes (Signed)
Pt was ordered a heart healthy/carb modified medium diet.

## 2011-07-31 NOTE — ED Notes (Signed)
Per pt sts intermittent chest pain that started last night. sts heaviness in chest. Denies SOB.

## 2011-07-31 NOTE — ED Provider Notes (Signed)
History     CSN: [id number] Arrival date & time: 07/31/2011 10:47 AM   First MD Initiated Contact with Patient 07/31/11 1052      Chief Complaint  Patient presents with  . Chest Pain    (Consider location/radiation/quality/duration/timing/severity/associated sxs/prior treatment) HPI The patient presents with chest pain, lightheadedness, generalized sense of discomfort. She notes that since a presentation 2 [facility] 1 week ago she has been generally uncomfortable. She notes no chest pain episodes until yesterday, when she suffered a half hour episode of spontaneously occurring and ceasing sternal discomfort described as pressure-like. Today these same symptoms recurred just prior to arrival, similarly without clear provoking or alleviating factors. The patient notes that over the past day or 2 she has been mildly lightheaded, no syncope, no nausea, no vomiting, no fevers, no chills, no cough. The patient does note that her pain began while she was prone, and after arising, and exerting herself the pain seemed better Past Medical History  Diagnosis Date  . Diabetes mellitus   . Hypertension   . Migraine     Past Surgical History  Procedure Date  . Ankle arthodesis w/ arthroscopy   . Tonsillectomy   . Abdominal hysterectomy     History reviewed. No pertinent family history.  History  Substance Use Topics  . Smoking status: Never Smoker   . Smokeless tobacco: Not on file  . Alcohol Use: No    OB History    Grav Para Term Preterm Abortions TAB SAB Ect Mult Living                  Review of Systems  Constitutional:       HPI  HENT:       HPI otherwise negative  Eyes: Negative.   Respiratory:       HPI, otherwise negative  Cardiovascular:       HPI, otherwise nmegative  Gastrointestinal: Negative for vomiting.  Genitourinary:       HPI, otherwise negative  Musculoskeletal:       HPI, otherwise negative  Skin: Negative.   Neurological: Negative for  syncope.    Allergies  Review of patient's allergies indicates no known allergies.  Home Medications   Current Outpatient Rx  Name Route Sig Dispense Refill  . ASPIRIN 325 MG PO TABS Oral Take 325 mg by mouth daily as needed. For heart problems     . DESVENLAFAXINE SUCCINATE ER 50 MG PO TB24 Oral Take 50 mg by mouth every evening.      . FENOFIBRATE 48 MG PO TABS Oral Take 48 mg by mouth daily.      . INSULIN GLARGINE 100 UNIT/ML [place] SOLN Subcutaneous Inject 70 Units into the skin 2 (two) times daily. 70 units in the morning and 60 units at night    . TRAMADOL HCL 50 MG PO TABS Oral Take 50 mg by mouth 4 (four) times daily as needed. For pain. Maximum dose= 8 tablets per day     . VALSARTAN 160 MG PO TABS Oral Take 160 mg by mouth daily.        BP 121/49  Pulse 89  Temp(Src) 97.9 F (36.6 C) (Oral)  Resp 20  SpO2 97%  Physical Exam  Nursing note and vitals reviewed. Constitutional: She is oriented to person, place, and time. She appears well-developed and well-nourished. No distress.  HENT:  Head: Normocephalic and atraumatic.  Eyes: Conjunctivae and EOM are normal.  Cardiovascular: Normal rate and regular rhythm.  Pulmonary/Chest: Effort normal. No stridor. She has no wheezes.  Abdominal: Soft. She exhibits no distension. There is no tenderness.  Musculoskeletal: She exhibits no edema.  Neurological: She is alert and oriented to person, place, and time. No cranial nerve deficit.  Skin: Skin is warm.  Psychiatric: She has a normal mood and affect.    ED Course  Procedures (including critical care time)  Labs Reviewed  CBC - Abnormal; Notable for the following:    Platelets 137 (*)    All other components within normal limits  CARDIAC PANEL(CRET KIN+CKTOT+MB+TROPI)  COMPREHENSIVE METABOLIC PANEL  MAGNESIUM   No results found.   No diagnosis found.  Pulse ox 99% on room air, normal Cardiac monitor 80s, sinus rhythm, normal  Date: 07/31/2011  Rate: 89  Rhythm:  normal sinus rhythm  QRS Axis: left  Intervals: normal  ST/T Wave abnormalities: normal  Conduction Disutrbances:left bundle branch block  Narrative Interpretation:   Old EKG Reviewed: unchanged   12:34 PM patient notes near-total resolution of her pain, and requests food. MDM  This obese 51 roll female now presents with episodic sternal pain. The patient's description of pain intermittently is suggestive of spasm versus esophageal versus GI etiology. The improvement of chest pain with exertion his reassuring, the absence of any respiratory complaints or other chest pain, is a similarly reassuring. On initial exam patient is in no distress unremarkable vital signs, and no notable physical exam findings beyond obesity.  The patient's x-Baxley and ECG were unremarkable, and the patient's labs were consistent with her normal values, most recently studied one week ago. The patient will receive magnesium supplement, and be discharged with ongoing Pepcid to follow up with her primary care physician.        Carmin Muskrat, MD 07/31/11 [phone]

## 2012-02-09 ENCOUNTER — Ambulatory Visit: Payer: Medicare Other | Admitting: Physician Assistant

## 2012-04-21 ENCOUNTER — Encounter (HOSPITAL_COMMUNITY): Payer: Self-pay

## 2012-04-21 ENCOUNTER — Emergency Department (INDEPENDENT_AMBULATORY_CARE_PROVIDER_SITE_OTHER)
Admission: EM | Admit: 2012-04-21 | Discharge: 2012-04-21 | Disposition: A | Discharge status: Home / Self Care | Payer: Medicare Other | Source: Home / Self Care | Attending: Emergency Medicine | Admitting: Emergency Medicine

## 2012-04-21 DIAGNOSIS — E119 Type 2 diabetes mellitus without complications: Secondary | ICD-10-CM

## 2012-04-21 DIAGNOSIS — R3989 Other symptoms and signs involving the genitourinary system: Secondary | ICD-10-CM

## 2012-04-21 DIAGNOSIS — R399 Unspecified symptoms and signs involving the genitourinary system: Secondary | ICD-10-CM

## 2012-04-21 LAB — POCT URINALYSIS DIP (DEVICE)
Hgb urine dipstick: NEGATIVE
Nitrite: NEGATIVE
Urobilinogen, UA: 0.2 mg/dL (ref 0.0–1.0)
pH: 5.5 (ref 5.0–8.0)

## 2012-04-21 MED ORDER — CEPHALEXIN 500 MG PO CAPS: 500.0000 mg | ORAL_CAPSULE | Freq: Three times a day (TID) | ORAL | Status: AC

## 2012-04-21 NOTE — ED Notes (Signed)
History of prior kidney infections, "feels as if I have another one"  C/o pain lower abdominal area, low back

## 2012-04-21 NOTE — ED Provider Notes (Addendum)
History     CSN: [id number]  Arrival date & time 04/21/12  1142   First MD Initiated Contact with Patient 04/21/12 1147      Chief Complaint  Patient presents with  . Urinary Tract Infection    (Consider location/radiation/quality/duration/timing/severity/associated sxs/prior treatment) HPI Comments: Patient presents urgent care complaining of lower abdominal pressure and discomfort and urinary frequently with some degree of pressure. Denies pain or burning. Patient denies any fever flank pain or vomiting. She thinks that perhaps yesterday she had a temperature but not certain. No vomiting, no vaginal discharge. She suspected that she might have a urinary tract infection as she has had many and this is usually how they start. Goes into describing how she takes care of her husband it is quadriplegic.  Patient is a 66 y.o. female presenting with urinary tract infection. The history is provided by the patient.  Urinary Tract Infection This is a new problem. The current episode started 2 days ago. The problem occurs constantly. Pertinent negatives include no chest pain, no abdominal pain, no headaches and no shortness of breath. Exacerbated by: urination. Nothing relieves the symptoms. She has tried nothing for the symptoms. The treatment provided no relief.    Past Medical History  Diagnosis Date  . Diabetes mellitus   . Hypertension   . Migraine     Past Surgical History  Procedure Date  . Ankle arthodesis w/ arthroscopy   . Tonsillectomy   . Abdominal hysterectomy     History reviewed. No pertinent family history.  History  Substance Use Topics  . Smoking status: Never Smoker   . Smokeless tobacco: Not on file  . Alcohol Use: No    OB History    Grav Para Term Preterm Abortions TAB SAB Ect Mult Living                  Review of Systems  Constitutional: Negative for fever, chills, activity change and appetite change.  Respiratory: Negative for shortness of breath.     Cardiovascular: Negative for chest pain.  Gastrointestinal: Negative for abdominal pain.  Genitourinary: Positive for dysuria. Negative for urgency, flank pain, vaginal discharge, vaginal pain and pelvic pain.  Skin: Negative for rash.  Neurological: Negative for headaches.    Allergies  Review of patient's allergies indicates no known allergies.  Home Medications   Current Outpatient Rx  Name Route Sig Dispense Refill  . ASPIRIN 325 MG PO TABS Oral Take 325 mg by mouth daily as needed. For heart problems     . TRAMADOL HCL 50 MG PO TABS Oral Take 50 mg by mouth 4 (four) times daily as needed. For pain. Maximum dose= 8 tablets per day     . VALSARTAN 160 MG PO TABS Oral Take 160 mg by mouth daily.      . CEPHALEXIN 500 MG PO CAPS Oral Take 1 capsule (500 mg total) by mouth 3 (three) times daily. 21 capsule 0  . DESVENLAFAXINE SUCCINATE ER 50 MG PO TB24 Oral Take 50 mg by mouth every evening.      Marland Kitchen FAMOTIDINE 20 MG PO TABS Oral Take 1 tablet (20 mg total) by mouth 2 (two) times daily. 30 tablet 0  . FENOFIBRATE 48 MG PO TABS Oral Take 48 mg by mouth daily.      . INSULIN GLARGINE 100 UNIT/ML [place] SOLN Subcutaneous Inject 70 Units into the skin 2 (two) times daily. 70 units in the morning and 60 units at night  BP 141/83  Pulse 82  Temp 98.7 F (37.1 C) (Oral)  Resp 22  SpO2 99%  Physical Exam  Nursing note and vitals reviewed. Constitutional: She appears well-developed and well-nourished.  Abdominal: Soft. She exhibits no distension and no mass. There is no hepatomegaly. There is tenderness. There is no rigidity, no rebound, no guarding, no tenderness at McBurney's point and negative Murphy's sign.  Musculoskeletal: She exhibits tenderness.  Skin: Skin is warm.    ED Course  Procedures (including critical care time)  Labs Reviewed  POCT URINALYSIS DIP (DEVICE) - Abnormal; Notable for the following:    Glucose, UA 250 (*)     All other components within normal limits   GLUCOSE, CAPILLARY - Abnormal; Notable for the following:    Glucose-Capillary 269 (*)     All other components within normal limits  URINE CULTURE   No results found.   1. Lower urinary tract symptoms       MDM  Patient with a history of recurrent urinary tract infections presented to urgent care today with lower abdominal discomfort and reproducible lower lumbar pain most consistent with muscular skeletal strain. Urinalysis was inconclusive so I have ordered for a urine culture. Have started patient on Keflex based on her symptoms and history.Rosana Hoes, MD 04/21/12 1353  Rosana Hoes, MD 04/21/12 [phone]

## 2012-04-22 LAB — URINE CULTURE
Colony Count: NO GROWTH
Culture: NO GROWTH
Special Requests: NORMAL

## 2012-05-05 ENCOUNTER — Encounter (HOSPITAL_COMMUNITY): Payer: Self-pay | Admitting: Emergency Medicine

## 2012-05-05 ENCOUNTER — Emergency Department (HOSPITAL_COMMUNITY)
Admission: EM | Admit: 2012-05-05 | Discharge: 2012-05-05 | Disposition: A | Discharge status: Home / Self Care | Payer: Medicare Other | Attending: Emergency Medicine | Admitting: Emergency Medicine

## 2012-05-05 DIAGNOSIS — N390 Urinary tract infection, site not specified: Secondary | ICD-10-CM | POA: Insufficient documentation

## 2012-05-05 DIAGNOSIS — I10 Essential (primary) hypertension: Secondary | ICD-10-CM | POA: Insufficient documentation

## 2012-05-05 DIAGNOSIS — E119 Type 2 diabetes mellitus without complications: Secondary | ICD-10-CM | POA: Insufficient documentation

## 2012-05-05 LAB — URINALYSIS, ROUTINE W REFLEX MICROSCOPIC
Bilirubin Urine: NEGATIVE
Nitrite: NEGATIVE
Protein, ur: NEGATIVE mg/dL
Specific Gravity, Urine: 1.023 (ref 1.005–1.030)
Urobilinogen, UA: 1 mg/dL (ref 0.0–1.0)

## 2012-05-05 LAB — URINE MICROSCOPIC-ADD ON

## 2012-05-05 MED ORDER — CIPROFLOXACIN HCL 500 MG PO TABS: 500.0000 mg | ORAL_TABLET | Freq: Two times a day (BID) | ORAL | Status: DC

## 2012-05-05 NOTE — ED Notes (Signed)
Pt presenting to ed with c/o burning with urination. Pt states she was seen at [facility] and placed on antibiotics pt states she's still having the same symptoms. Pt states she is also having back pain pt denies fever, pt denies hematuria pt states some nausea no vomiting

## 2012-05-05 NOTE — ED Provider Notes (Signed)
History     CSN: [id number]  Arrival date & time 05/05/12  51   First MD Initiated Contact with Patient 05/05/12 1647      Chief Complaint  Patient presents with  . Dysuria  . Back Pain    (Consider location/radiation/quality/duration/timing/severity/associated sxs/prior treatment) HPI.... dysuria, frequency, suprapubic discomfort for 2 days. Similar symptoms recently c Rx Keflex for urinary tract infection. No Fever, sweats, chills.  Nothing makes symptoms better or worse. Severity is mild.  Past Medical History  Diagnosis Date  . Diabetes mellitus   . Hypertension   . Migraine     Past Surgical History  Procedure Date  . Ankle arthodesis w/ arthroscopy   . Tonsillectomy   . Abdominal hysterectomy     No family history on file.  History  Substance Use Topics  . Smoking status: Never Smoker   . Smokeless tobacco: Not on file  . Alcohol Use: No    OB History    Grav Para Term Preterm Abortions TAB SAB Ect Mult Living                  Review of Systems  All other systems reviewed and are negative.    Allergies  Review of patient's allergies indicates no known allergies.  Home Medications   Current Outpatient Rx  Name Route Sig Dispense Refill  . ASPIRIN 325 MG PO TABS Oral Take 325 mg by mouth daily.     Marland Kitchen CALCIUM CARBONATE-VITAMIN D 500-200 MG-UNIT PO TABS Oral Take 1 tablet by mouth daily.    . DESVENLAFAXINE SUCCINATE ER 50 MG PO TB24 Oral Take 50 mg by mouth every evening.      Marland Kitchen FAMOTIDINE 20 MG PO TABS Oral Take 1 tablet (20 mg total) by mouth 2 (two) times daily. 30 tablet 0  . OMEGA-3 FATTY ACIDS 1000 MG PO CAPS Oral Take 1 g by mouth daily.    . INSULIN GLARGINE 100 UNIT/ML [place] SOLN Subcutaneous Inject 60-70 Units into the skin 2 (two) times daily. 70 units in the morning and 60 units at night    . TRAMADOL HCL 50 MG PO TABS Oral Take 50 mg by mouth 4 (four) times daily as needed. For pain. Maximum dose= 8 tablets per day     . VALSARTAN 160  MG PO TABS Oral Take 160 mg by mouth daily.      Marland Kitchen CIPROFLOXACIN HCL 500 MG PO TABS Oral Take 1 tablet (500 mg total) by mouth 2 (two) times daily. 20 tablet 0    BP 146/95  Pulse 68  Temp 98.8 F (37.1 C) (Oral)  Resp 20  SpO2 98%  Physical Exam  Nursing note and vitals reviewed. Constitutional: She is oriented to person, place, and time. She appears well-developed and well-nourished.  HENT:  Head: Normocephalic and atraumatic.  Eyes: Conjunctivae normal and EOM are normal. Pupils are equal, round, and reactive to light.  Neck: Normal range of motion. Neck supple.  Cardiovascular: Normal rate, regular rhythm and normal heart sounds.   Pulmonary/Chest: Effort normal and breath sounds normal.  Abdominal:       Slight suprapubic tenderness  Genitourinary:       Slight bilateral CVA tenderness  Musculoskeletal: Normal range of motion.  Neurological: She is alert and oriented to person, place, and time.  Skin: Skin is warm and dry.  Psychiatric: She has a normal mood and affect.    ED Course  Procedures (including critical care time)  Labs Reviewed  URINALYSIS, ROUTINE W REFLEX MICROSCOPIC - Abnormal; Notable for the following:    APPearance CLOUDY (*)     Leukocytes, UA MODERATE (*)     All other components within normal limits  URINE MICROSCOPIC-ADD ON - Abnormal; Notable for the following:    Squamous Epithelial / LPF FEW (*)     Bacteria, UA FEW (*)     All other components within normal limits  URINE CULTURE   No results found.   1. Urinary tract infection       MDM  No clinical evidence of pyelonephritis. Urinalysis shows moderate leukocytes. Rx Cipro for cystitis        Nat Christen, MD 05/05/12 1929

## 2012-05-07 LAB — URINE CULTURE: Colony Count: 40000

## 2013-07-25 ENCOUNTER — Emergency Department (HOSPITAL_COMMUNITY): Payer: Medicare Other

## 2013-07-25 ENCOUNTER — Encounter (HOSPITAL_COMMUNITY): Payer: Self-pay | Admitting: Emergency Medicine

## 2013-07-25 ENCOUNTER — Observation Stay (HOSPITAL_COMMUNITY)
Admission: EM | Admit: 2013-07-25 | Discharge: 2013-07-27 | Disposition: A | Discharge status: Home / Self Care | Payer: Medicare Other | Attending: Internal Medicine | Admitting: Internal Medicine

## 2013-07-25 DIAGNOSIS — Z79899 Other long term (current) drug therapy: Secondary | ICD-10-CM | POA: Insufficient documentation

## 2013-07-25 DIAGNOSIS — R0789 Other chest pain: Principal | ICD-10-CM | POA: Insufficient documentation

## 2013-07-25 DIAGNOSIS — R079 Chest pain, unspecified: Secondary | ICD-10-CM

## 2013-07-25 DIAGNOSIS — I10 Essential (primary) hypertension: Secondary | ICD-10-CM | POA: Insufficient documentation

## 2013-07-25 DIAGNOSIS — Z7982 Long term (current) use of aspirin: Secondary | ICD-10-CM | POA: Insufficient documentation

## 2013-07-25 DIAGNOSIS — E669 Obesity, unspecified: Secondary | ICD-10-CM

## 2013-07-25 DIAGNOSIS — Z794 Long term (current) use of insulin: Secondary | ICD-10-CM | POA: Insufficient documentation

## 2013-07-25 DIAGNOSIS — E785 Hyperlipidemia, unspecified: Secondary | ICD-10-CM

## 2013-07-25 DIAGNOSIS — G43909 Migraine, unspecified, not intractable, without status migrainosus: Secondary | ICD-10-CM | POA: Insufficient documentation

## 2013-07-25 DIAGNOSIS — I639 Cerebral infarction, unspecified: Secondary | ICD-10-CM

## 2013-07-25 DIAGNOSIS — G459 Transient cerebral ischemic attack, unspecified: Secondary | ICD-10-CM | POA: Insufficient documentation

## 2013-07-25 DIAGNOSIS — R0602 Shortness of breath: Secondary | ICD-10-CM | POA: Insufficient documentation

## 2013-07-25 DIAGNOSIS — E119 Type 2 diabetes mellitus without complications: Secondary | ICD-10-CM | POA: Insufficient documentation

## 2013-07-25 LAB — BASIC METABOLIC PANEL
BUN: 26 mg/dL — ABNORMAL HIGH (ref 6–23)
CO2: 27 mEq/L (ref 19–32)
Calcium: 8.9 mg/dL (ref 8.4–10.5)
Chloride: 98 mEq/L (ref 96–112)
Creatinine, Ser: 1.33 mg/dL — ABNORMAL HIGH (ref 0.50–1.10)
GFR calc Af Amer: 46 mL/min — ABNORMAL LOW (ref >90)
GFR calc non Af Amer: 40 mL/min — ABNORMAL LOW (ref >90)
Glucose, Bld: 341 mg/dL — ABNORMAL HIGH (ref 70–99)
Potassium: 4.8 mEq/L (ref 3.5–5.1)
Sodium: 134 mEq/L — ABNORMAL LOW (ref 135–145)

## 2013-07-25 LAB — CBC WITH DIFFERENTIAL/PLATELET
Basophils Absolute: 0 10*3/uL (ref 0.0–0.1)
Basophils Relative: 0 % (ref 0–1)
Eosinophils Absolute: 0.1 10*3/uL (ref 0.0–0.7)
Eosinophils Relative: 2 % (ref 0–5)
HCT: 38.5 % (ref 36.0–46.0)
Hemoglobin: 12.7 g/dL (ref 12.0–15.0)
Lymphocytes Relative: 26 % (ref 12–46)
Lymphs Abs: 1.6 10*3/uL (ref 0.7–4.0)
MCH: 29.3 pg (ref 26.0–34.0)
MCHC: 33 g/dL (ref 30.0–36.0)
MCV: 88.9 fL (ref 78.0–100.0)
Monocytes Absolute: 0.4 10*3/uL (ref 0.1–1.0)
Monocytes Relative: 6 % (ref 3–12)
Neutro Abs: 4.2 10*3/uL (ref 1.7–7.7)
Neutrophils Relative %: 66 % (ref 43–77)
Platelets: 170 10*3/uL (ref 150–400)
RBC: 4.33 MIL/uL (ref 3.87–5.11)
RDW: 13.4 % (ref 11.5–15.5)
WBC: 6.3 10*3/uL (ref 4.0–10.5)

## 2013-07-25 LAB — POCT I-STAT TROPONIN I: Troponin i, poc: 0 ng/mL (ref 0.00–0.08)

## 2013-07-25 MED ORDER — ALBUTEROL SULFATE (5 MG/ML) 0.5% IN NEBU
5.0000 mg | INHALATION_SOLUTION | Freq: Once | RESPIRATORY_TRACT | Status: AC
  Administered 2013-07-25: 5 mg via RESPIRATORY_TRACT
  Filled 2013-07-25: qty 1

## 2013-07-25 MED ORDER — IPRATROPIUM BROMIDE 0.02 % IN SOLN
0.5000 mg | Freq: Once | RESPIRATORY_TRACT | Status: AC
  Administered 2013-07-25: 0.5 mg via RESPIRATORY_TRACT
  Filled 2013-07-25: qty 2.5

## 2013-07-25 NOTE — ED Provider Notes (Signed)
CSN: [id number]     Arrival date & time 07/25/13  1806 History   None    Chief Complaint  Patient presents with  . Chest Pain  . Shortness of Breath    HPI  Tamara Fry is a 67 y.o. female with a PMH of DM, HTN, and migraine who presents to the ED for evaluation of chest pain and headache.  History was provided by the patient.  Patient states that she had a sudden onset of headache on Monday 07/23/13, which lasted 10 minutes. She states that her headache was generally located throughout without radiation. She states it was "the worst headache of my life". Patient states that she has a history of migraine headaches and it "was nothing like my migraines". Patient states that she "just didn't feel right yesterday". Patient states that she developed another sudden onset headache today which is similar to a headache 2 days ago. Patient states that she is developing blurry vision and dizziness during these headaches. She also has been having episodes of chest pain starting today at 5:00 PM. Her chest pain is located in the midsternal region with radiation to the left side of her chest. Her chest pain comes and goes and she's not currently having chest pain. Her chest lasts minutes to an hour and resolves.  She denies any associated symptoms with her chest pain including shortness of breath, diaphoresis, or lightheadedness. She states that she had some shortness of breath with exertion, which is baseline for her. She denies any history of clotting/DVT/PE. No history of cancer or recent travel. She otherwise has been well with no fevers, change in appetite or activity, rhinorrhea, congestion, cough, abdominal pain, nausea, vomiting, diarrhea, constipation, dysuria, or leg edema. Patient does not believe she has had a recent stress test or cardiac evaluation by her primary care provider. She is a previous tobacco smoker. No cocaine use. Patient states she is "mostly worried about my headache".   Past Medical  History  Diagnosis Date  . Diabetes mellitus   . Hypertension   . Migraine    Past Surgical History  Procedure Laterality Date  . Ankle arthodesis w/ arthroscopy    . Tonsillectomy    . Abdominal hysterectomy     No family history on file. History  Substance Use Topics  . Smoking status: Never Smoker   . Smokeless tobacco: Not on file  . Alcohol Use: No   OB History   Grav Para Term Preterm Abortions TAB SAB Ect Mult Living                 Review of Systems  Constitutional: Negative for fever, chills, diaphoresis, activity change, appetite change and fatigue.  HENT: Negative for congestion, rhinorrhea and sore throat.   Eyes: Positive for visual disturbance.  Respiratory: Negative for cough, shortness of breath and wheezing.   Cardiovascular: Positive for chest pain. Negative for palpitations and leg swelling.  Gastrointestinal: Negative for nausea, vomiting, abdominal pain, diarrhea and constipation.  Genitourinary: Negative for dysuria.  Musculoskeletal: Negative for back pain, gait problem, myalgias and neck pain.  Skin: Negative for rash and wound.  Neurological: Positive for dizziness and headaches. Negative for syncope, facial asymmetry, weakness and numbness.  Psychiatric/Behavioral: Negative for confusion.    Allergies  Review of patient's allergies indicates no known allergies.  Home Medications   Current Outpatient Rx  Name  Route  Sig  Dispense  Refill  . aspirin 325 MG tablet   Oral  Take 325 mg by mouth daily.          Marland Kitchen desvenlafaxine (PRISTIQ) 50 MG 24 hr tablet   Oral   Take 50 mg by mouth every evening.           . fish oil-omega-3 fatty acids 1000 MG capsule   Oral   Take 1 g by mouth daily.         . insulin glargine (LANTUS) 100 UNIT/ML injection   Subcutaneous   Inject 60-70 Units into the skin 2 (two) times daily. 70 units in the morning and 60 units at night         . traMADol (ULTRAM) 50 MG tablet   Oral   Take 50 mg by  mouth 4 (four) times daily as needed. For pain. Maximum dose= 8 tablets per day          . valsartan (DIOVAN) 160 MG tablet   Oral   Take 160 mg by mouth daily.           Marland Kitchen EXPIRED: famotidine (PEPCID) 20 MG tablet   Oral   Take 1 tablet (20 mg total) by mouth 2 (two) times daily.   30 tablet   0    BP 106/20  Pulse 66  Temp(Src) 98.3 F (36.8 C) (Oral)  Resp 16  SpO2 98%  Filed Vitals:   07/25/13 1931 07/25/13 2000 07/25/13 2030 07/25/13 2100  BP: 106/20 115/57 115/54 124/66  Pulse: 66 64 71 64  Temp:      TempSrc:      Resp: 16 14 15 13   SpO2: 98% 97% 100% 96%    Physical Exam  Nursing note and vitals reviewed. Constitutional: She is oriented to person, place, and time. She appears well-developed and well-nourished. No distress.  HENT:  Head: Normocephalic and atraumatic.  Right Ear: External ear normal.  Left Ear: External ear normal.  Nose: Nose normal.  Mouth/Throat: Oropharynx is clear and moist. No oropharyngeal exudate.  Eyes: Conjunctivae are normal. Pupils are equal, round, and reactive to light. Right eye exhibits no discharge. Left eye exhibits no discharge.  Neck: Normal range of motion. Neck supple.  Cardiovascular: Normal rate, regular rhythm, normal heart sounds and intact distal pulses.  Exam reveals no gallop and no friction rub.   No murmur heard. Dorsalis pedis pulses present and equal bilaterally  Pulmonary/Chest: Effort normal and breath sounds normal. No respiratory distress. She has no wheezes. She has no rales. She exhibits no tenderness.  Abdominal: Soft. Bowel sounds are normal. She exhibits no distension and no mass. There is no tenderness. There is no rebound and no guarding.  Musculoskeletal: Normal range of motion. She exhibits no edema and no tenderness.  Strength 5/5 in the upper and lower extremities bilaterally.  Patient able to ambulate without difficulty or ataxia. No pedal edema or calf tenderness bilaterally.    Neurological:  She is alert and oriented to person, place, and time.  GCS 15.  No focal neurological deficits.  CN 2-12 intact.  No pronator drift.  Finger to nose intact.  Heel to shin intact.    Skin: Skin is warm and dry. She is not diaphoretic.    ED Course  Procedures (including critical care time) Labs Review Labs Reviewed  BASIC METABOLIC PANEL - Abnormal; Notable for the following:    Sodium 134 (*)    Glucose, Bld 341 (*)    BUN 26 (*)    Creatinine, Ser 1.33 (*)  GFR calc non Af Amer 40 (*)    GFR calc Af Amer 46 (*)    All other components within normal limits  CBC WITH DIFFERENTIAL  POCT I-STAT TROPONIN I   Imaging Review Dg Chest 2 View  07/25/2013   CLINICAL DATA:  Mid chest pain for 3 days  EXAM: CHEST  2 VIEW  COMPARISON:  07/31/2011; 07/15/2011; 06/11/2010; chest CT -08/14/2009  FINDINGS: Grossly unchanged borderline enlarged cardiac silhouette and mediastinal contours. Evaluation of the retrosternal clear space obscured secondary to overlying soft tissue structures. There is mild diffuse slightly nodular thickening of the pulmonary air Station. No focal airspace opacities. No pleural effusion or pneumothorax. No definite evidence of edema. Unchanged bones.  IMPRESSION: Findings suggestive of airways disease / bronchitis. No focal airspace opacities to suggest pneumonia.   Electronically Signed   By: Sandi Mariscal M.D.   On: 07/25/2013 19:15    EKG Interpretation    Date/Time:  Wednesday July 25 2013 18:12:04 EST Ventricular Rate:  78 PR Interval:  152 QRS Duration: 102 QT Interval:  382 QTC Calculation: 435 R Axis:   -34 Text Interpretation:  Normal sinus rhythm Left axis deviation Minimal voltage criteria for LVH, may be normal variant Possible Anterior infarct , age undetermined Abnormal ECG No significant change since last tracing Confirmed by YAO  MD, DAVID [phone]) on 07/25/2013 7:21:45 PM            Results for orders placed during the hospital encounter of  07/25/13  CBC WITH DIFFERENTIAL      Result Value Range   WBC 6.3  4.0 - 10.5 K/uL   RBC 4.33  3.87 - 5.11 MIL/uL   Hemoglobin 12.7  12.0 - 15.0 g/dL   HCT 38.5  36.0 - 46.0 %   MCV 88.9  78.0 - 100.0 fL   MCH 29.3  26.0 - 34.0 pg   MCHC 33.0  30.0 - 36.0 g/dL   RDW 13.4  11.5 - 15.5 %   Platelets 170  150 - 400 K/uL   Neutrophils Relative % 66  43 - 77 %   Neutro Abs 4.2  1.7 - 7.7 K/uL   Lymphocytes Relative 26  12 - 46 %   Lymphs Abs 1.6  0.7 - 4.0 K/uL   Monocytes Relative 6  3 - 12 %   Monocytes Absolute 0.4  0.1 - 1.0 K/uL   Eosinophils Relative 2  0 - 5 %   Eosinophils Absolute 0.1  0.0 - 0.7 K/uL   Basophils Relative 0  0 - 1 %   Basophils Absolute 0.0  0.0 - 0.1 K/uL  BASIC METABOLIC PANEL      Result Value Range   Sodium 134 (*) 135 - 145 mEq/L   Potassium 4.8  3.5 - 5.1 mEq/L   Chloride 98  96 - 112 mEq/L   CO2 27  19 - 32 mEq/L   Glucose, Bld 341 (*) 70 - 99 mg/dL   BUN 26 (*) 6 - 23 mg/dL   Creatinine, Ser 1.33 (*) 0.50 - 1.10 mg/dL   Calcium 8.9  8.4 - 10.5 mg/dL   GFR calc non Af Amer 40 (*) >90 mL/min   GFR calc Af Amer 46 (*) >90 mL/min  SEDIMENTATION RATE      Result Value Range   Sed Rate 27 (*) 0 - 22 mm/hr  TROPONIN I      Result Value Range   Troponin I <0.30  <0.30 ng/mL  TROPONIN I      Result Value Range   Troponin I <0.30  <0.30 ng/mL  TROPONIN I      Result Value Range   Troponin I <0.30  <0.30 ng/mL  BASIC METABOLIC PANEL      Result Value Range   Sodium 136  135 - 145 mEq/L   Potassium 4.4  3.5 - 5.1 mEq/L   Chloride 101  96 - 112 mEq/L   CO2 26  19 - 32 mEq/L   Glucose, Bld 294 (*) 70 - 99 mg/dL   BUN 23  6 - 23 mg/dL   Creatinine, Ser 1.21 (*) 0.50 - 1.10 mg/dL   Calcium 8.6  8.4 - 10.5 mg/dL   GFR calc non Af Amer 45 (*) >90 mL/min   GFR calc Af Amer 52 (*) >90 mL/min  CBC      Result Value Range   WBC 6.0  4.0 - 10.5 K/uL   RBC 4.13  3.87 - 5.11 MIL/uL   Hemoglobin 11.9 (*) 12.0 - 15.0 g/dL   HCT 36.7  36.0 - 46.0 %    MCV 88.9  78.0 - 100.0 fL   MCH 28.8  26.0 - 34.0 pg   MCHC 32.4  30.0 - 36.0 g/dL   RDW 13.4  11.5 - 15.5 %   Platelets 150  150 - 400 K/uL  CBC      Result Value Range   WBC 5.9  4.0 - 10.5 K/uL   RBC 4.10  3.87 - 5.11 MIL/uL   Hemoglobin 12.1  12.0 - 15.0 g/dL   HCT 36.4  36.0 - 46.0 %   MCV 88.8  78.0 - 100.0 fL   MCH 29.5  26.0 - 34.0 pg   MCHC 33.2  30.0 - 36.0 g/dL   RDW 13.5  11.5 - 15.5 %   Platelets 140 (*) 150 - 400 K/uL  CREATININE, SERUM      Result Value Range   Creatinine, Ser 1.29 (*) 0.50 - 1.10 mg/dL   GFR calc non Af Amer 42 (*) >90 mL/min   GFR calc Af Amer 48 (*) >90 mL/min  GLUCOSE, CAPILLARY      Result Value Range   Glucose-Capillary 314 (*) 70 - 99 mg/dL   Comment 1 Notify RN    GLUCOSE, CAPILLARY      Result Value Range   Glucose-Capillary 191 (*) 70 - 99 mg/dL   Comment 1 Documented in Chart     Comment 2 Notify RN    GLUCOSE, CAPILLARY      Result Value Range   Glucose-Capillary 122 (*) 70 - 99 mg/dL  POCT I-STAT TROPONIN I      Result Value Range   Troponin i, poc 0.00  0.00 - 0.08 ng/mL   Comment 3           POCT I-STAT TROPONIN I      Result Value Range   Troponin i, poc 0.00  0.00 - 0.08 ng/mL   Comment 3             CT Head Wo Contrast (Final result)  Result time: 07/25/13 21:32:29    Final result by Rad Results In Interface (07/25/13 21:32:29)    Narrative:   CLINICAL DATA: Headache and blurred vision  EXAM: CT HEAD WITHOUT CONTRAST  TECHNIQUE: Contiguous axial images were obtained from the base of the skull through the vertex without intravenous contrast. Study was performed within 24 hr of patient's arrival  at the emergency department.  COMPARISON: July 31, 2009  FINDINGS: There is mild diffuse atrophy, stable. There is moderate frontal atrophy bilaterally which is stable. There is no mass, hemorrhage, extra-axial fluid collection, or midline shift. There is mild small vessel disease in the centra semiovale  bilaterally which is stable. There is no new gray-white compartment lesion. No demonstrable acute infarct. Bony calvarium appears intact. The mastoid air cells are clear.  IMPRESSION: Atrophy with mild small vessel disease, stable. No intracranial mass, hemorrhage, or acute appearing infarct.   Electronically Signed By: Lowella Grip M.D. On: 07/25/2013 21:32             DG Chest 2 View (Final result)  Result time: 07/25/13 19:15:24    Final result by Rad Results In Interface (07/25/13 19:15:24)    Narrative:   CLINICAL DATA: Mid chest pain for 3 days  EXAM: CHEST 2 VIEW  COMPARISON: 07/31/2011; 07/15/2011; 06/11/2010; chest CT -08/14/2009  FINDINGS: Grossly unchanged borderline enlarged cardiac silhouette and mediastinal contours. Evaluation of the retrosternal clear space obscured secondary to overlying soft tissue structures. There is mild diffuse slightly nodular thickening of the pulmonary air Station. No focal airspace opacities. No pleural effusion or pneumothorax. No definite evidence of edema. Unchanged bones.  IMPRESSION: Findings suggestive of airways disease / bronchitis. No focal airspace opacities to suggest pneumonia.   Electronically Signed By: Sandi Mariscal M.D. On: 07/25/2013 19:15         MDM   Tamara Fry is a 67 y.o. female with a PMH of DM, HTN, and migraine who presents to the ED for evaluation of chest pain and headache.     Rechecks  11:30 PM = Spoke with patient about admission for chest pain and TIA work-up.  She is in agreement with admission and plan.    Consults  11:45 PM = Spoke with Dr. Hal Hope.  Patient will be admitted to inpatient for further evaluation and management.      Patient seen in the ED for chest pain and headache.  Etiology of headache possibly due to a migraine (atypical for patient with hx of migraines) vs TIA.  Patient has been having sudden onset severe global headaches associated with dizziness and  vision changes.  She also has been having dizziness off and on for the past two weeks.  Her head CT was negative for an acute intracranial process and she has no focal neurological deficits.  Patient also complained of chest pain, which started today.  Chest pain atypical with no associated symptoms.  Chest x-Martelli consistent with bronchitis however patient has no respiratory complaints.  EKG negative for any acute ischemic changes.  Patient had two negative troponin's.  No chest pain throughout ED visit.  Patient has risk factors for cardiac disease including HTN, DM, and previous hx of tobacco use.  Patient denies any cardiac evaluation as an OP including stress test in the past.  Patient evaluated for further evaluation and management.  Patient in agreement with admission and plan.     Final impressions: 1. TIA (transient ischemic attack)   2. Chest pain   3. Diabetes mellitus   4. Headache   5. Unspecified essential hypertension       Mercy Moore PA-C   This patient was discussed with Dr. Ivor Messier, PA-C 07/26/13 1415

## 2013-07-25 NOTE — ED Notes (Signed)
Patient transported to CT 

## 2013-07-25 NOTE — ED Notes (Signed)
Pt returned from CT, pt placed back on cardiac monitor

## 2013-07-25 NOTE — ED Notes (Signed)
Pt presents to department for evaluation of midsternal chest pain radiating to L side of chest. Also states SOB, headache and blurred vision. Onset Monday. 7/10 pain at the time. Pt is alert and oriented x4. NAD.

## 2013-07-26 ENCOUNTER — Observation Stay (HOSPITAL_COMMUNITY): Payer: Medicare Other

## 2013-07-26 ENCOUNTER — Encounter (HOSPITAL_COMMUNITY): Payer: Self-pay | Admitting: Internal Medicine

## 2013-07-26 DIAGNOSIS — I639 Cerebral infarction, unspecified: Secondary | ICD-10-CM

## 2013-07-26 DIAGNOSIS — E119 Type 2 diabetes mellitus without complications: Secondary | ICD-10-CM

## 2013-07-26 DIAGNOSIS — G459 Transient cerebral ischemic attack, unspecified: Secondary | ICD-10-CM

## 2013-07-26 DIAGNOSIS — I10 Essential (primary) hypertension: Secondary | ICD-10-CM

## 2013-07-26 DIAGNOSIS — R51 Headache: Secondary | ICD-10-CM

## 2013-07-26 DIAGNOSIS — R079 Chest pain, unspecified: Secondary | ICD-10-CM

## 2013-07-26 LAB — CBC
HCT: 36.7 % (ref 36.0–46.0)
Hemoglobin: 12.1 g/dL (ref 12.0–15.0)
MCH: 29.5 pg (ref 26.0–34.0)
MCH: 28.8 pg (ref 26.0–34.0)
MCHC: 33.2 g/dL (ref 30.0–36.0)
MCV: 88.9 fL (ref 78.0–100.0)
Platelets: 140 10*3/uL — ABNORMAL LOW (ref 150–400)
Platelets: 150 10*3/uL (ref 150–400)
RBC: 4.13 MIL/uL (ref 3.87–5.11)
RDW: 13.5 % (ref 11.5–15.5)
RDW: 13.4 % (ref 11.5–15.5)
WBC: 5.9 10*3/uL (ref 4.0–10.5)
WBC: 6 10*3/uL (ref 4.0–10.5)

## 2013-07-26 LAB — SEDIMENTATION RATE: Sed Rate: 27 mm/hr — ABNORMAL HIGH (ref 0–22)

## 2013-07-26 LAB — CREATININE, SERUM
Creatinine, Ser: 1.29 mg/dL — ABNORMAL HIGH (ref 0.50–1.10)
GFR calc non Af Amer: 42 mL/min — ABNORMAL LOW (ref >90)

## 2013-07-26 LAB — BASIC METABOLIC PANEL
BUN: 23 mg/dL (ref 6–23)
CO2: 26 mEq/L (ref 19–32)
Calcium: 8.6 mg/dL (ref 8.4–10.5)
Chloride: 101 mEq/L (ref 96–112)
Creatinine, Ser: 1.21 mg/dL — ABNORMAL HIGH (ref 0.50–1.10)
GFR calc non Af Amer: 45 mL/min — ABNORMAL LOW (ref >90)
Glucose, Bld: 294 mg/dL — ABNORMAL HIGH (ref 70–99)

## 2013-07-26 LAB — TROPONIN I
Troponin I: <0.3 ng/mL (ref <0.30)
Troponin I: <0.3 ng/mL (ref <0.30)

## 2013-07-26 LAB — GLUCOSE, CAPILLARY
Glucose-Capillary: 122 mg/dL — ABNORMAL HIGH (ref 70–99)
Glucose-Capillary: 148 mg/dL — ABNORMAL HIGH (ref 70–99)

## 2013-07-26 LAB — HEMOGLOBIN A1C: Mean Plasma Glucose: 186 mg/dL — ABNORMAL HIGH (ref <117)

## 2013-07-26 MED ORDER — ONDANSETRON HCL 4 MG PO TABS: 4.0000 mg | ORAL_TABLET | Freq: Four times a day (QID) | ORAL | Status: DC | PRN

## 2013-07-26 MED ORDER — ACETAMINOPHEN 325 MG PO TABS: 650.0000 mg | ORAL_TABLET | Freq: Four times a day (QID) | ORAL | Status: DC | PRN

## 2013-07-26 MED ORDER — IRBESARTAN 150 MG PO TABS
150.0000 mg | ORAL_TABLET | Freq: Every day | ORAL | Status: DC
  Administered 2013-07-26 – 2013-07-27 (×2): 150 mg via ORAL
  Filled 2013-07-26 (×2): qty 1

## 2013-07-26 MED ORDER — ONDANSETRON HCL 4 MG/2ML IJ SOLN: 4.0000 mg | Freq: Four times a day (QID) | INTRAMUSCULAR | Status: DC | PRN

## 2013-07-26 MED ORDER — ACETAMINOPHEN 650 MG RE SUPP: 650.0000 mg | Freq: Four times a day (QID) | RECTAL | Status: DC | PRN

## 2013-07-26 MED ORDER — INSULIN GLARGINE 100 UNIT/ML ~~LOC~~ SOLN
60.0000 [IU] | Freq: Every day | SUBCUTANEOUS | Status: DC
  Administered 2013-07-26 (×2): 60 [IU] via SUBCUTANEOUS
  Filled 2013-07-26 (×3): qty 0.6

## 2013-07-26 MED ORDER — INSULIN GLARGINE 100 UNIT/ML ~~LOC~~ SOLN
70.0000 [IU] | Freq: Every day | SUBCUTANEOUS | Status: DC
  Administered 2013-07-26 – 2013-07-27 (×2): 70 [IU] via SUBCUTANEOUS
  Filled 2013-07-26 (×2): qty 0.7

## 2013-07-26 MED ORDER — SODIUM CHLORIDE 0.9 % IV SOLN
INTRAVENOUS | Status: AC
  Administered 2013-07-26: 02:00:00 via INTRAVENOUS

## 2013-07-26 MED ORDER — VENLAFAXINE HCL ER 75 MG PO CP24
75.0000 mg | ORAL_CAPSULE | Freq: Every day | ORAL | Status: DC
  Administered 2013-07-26 – 2013-07-27 (×2): 75 mg via ORAL
  Filled 2013-07-26 (×3): qty 1

## 2013-07-26 MED ORDER — TRAMADOL HCL 50 MG PO TABS
50.0000 mg | ORAL_TABLET | Freq: Four times a day (QID) | ORAL | Status: DC | PRN
  Administered 2013-07-26 (×2): 50 mg via ORAL
  Filled 2013-07-26 (×2): qty 1

## 2013-07-26 MED ORDER — ASPIRIN 325 MG PO TABS: 325.0000 mg | ORAL_TABLET | Freq: Every day | ORAL | Status: DC

## 2013-07-26 MED ORDER — ASPIRIN EC 325 MG PO TBEC
325.0000 mg | DELAYED_RELEASE_TABLET | Freq: Every day | ORAL | Status: DC
  Administered 2013-07-26: 325 mg via ORAL
  Filled 2013-07-26: qty 1

## 2013-07-26 MED ORDER — SODIUM CHLORIDE 0.9 % IJ SOLN
3.0000 mL | Freq: Two times a day (BID) | INTRAMUSCULAR | Status: DC
  Administered 2013-07-26 – 2013-07-27 (×3): 3 mL via INTRAVENOUS

## 2013-07-26 MED ORDER — FAMOTIDINE 20 MG PO TABS
20.0000 mg | ORAL_TABLET | Freq: Two times a day (BID) | ORAL | Status: DC
  Administered 2013-07-26 – 2013-07-27 (×3): 20 mg via ORAL
  Filled 2013-07-26 (×4): qty 1

## 2013-07-26 MED ORDER — ENOXAPARIN SODIUM 40 MG/0.4ML ~~LOC~~ SOLN
40.0000 mg | SUBCUTANEOUS | Status: DC
  Administered 2013-07-26: 40 mg via SUBCUTANEOUS
  Filled 2013-07-26 (×2): qty 0.4

## 2013-07-26 MED ORDER — OMEGA-3-ACID ETHYL ESTERS 1 G PO CAPS
1.0000 g | ORAL_CAPSULE | Freq: Every day | ORAL | Status: DC
  Administered 2013-07-26 – 2013-07-27 (×2): 1 g via ORAL
  Filled 2013-07-26 (×2): qty 1

## 2013-07-26 MED ORDER — INSULIN ASPART 100 UNIT/ML ~~LOC~~ SOLN
0.0000 [IU] | Freq: Three times a day (TID) | SUBCUTANEOUS | Status: DC
  Administered 2013-07-26: 2 [IU] via SUBCUTANEOUS
  Administered 2013-07-26: 1 [IU] via SUBCUTANEOUS

## 2013-07-26 NOTE — Progress Notes (Signed)
UR completed 

## 2013-07-26 NOTE — Discharge Summary (Addendum)
Physician Discharge Summary  Tamara Fry [id number] DOB: 10-06-1945 DOA: 07/25/2013  PCP: Dwan Bolt, MD  Admit date: 07/25/2013 Discharge date: 07/26/2013  Time spent: 35 minutes  Recommendations for Outpatient Follow-up:  1. Need follow up with PCP if headache reoccur consider repeat ESR.  2. Need better diabetes control.   Discharge Diagnoses:    Migraine   Chest pain, resolved   DIABETES MELLITUS, TYPE II   HYPERTENSION   Headache   Diabetes mellitus   Discharge Condition: Stable.   Diet recommendation: Carb Modified.   Filed Weights   07/26/13 0054  Weight: 95.482 kg (210 lb 8 oz)    History of present illness:  Tamara Fry is a 67 y.o. female history of diabetes mellitus type 2 and hypertension, migraine presented to the ER because of headache and chest pain. Patient has been experiencing headache, globally since last afternoon 1 PM. The pain is quite intense around 1 PM with some blurred vision. Patient took tramadol after which it subsided. Patient also had the same time had some chest pressure. The chest pressure lasted for around 15 minutes. Subsided by itself. It was nonradiating and no associated diaphoresis shortness of breath nausea vomiting abdominal pain. In the ER EKG and cardiac markers were negative. CT head did not show anything acute. On exam patient was nonfocal. Patient states that she usually gets headaches once or twice a week and tramadol helps it usually by this time she felt was more intense than usual and was associated with dizziness. Patient has been admitted for further management. Patient had a similar episode last week.    Hospital Course:  1. Chest pain this has resolved. ECHO pending. Troponin times 3 negative. Continue with aspirin.  2. Headaches with dizziness and transient blurred vision - may be related to her migraine. Patient denies headache in the temporal area, no jaw pain. Blurry vision has resolved. ESR just 5 point above  normal. If MRI negative patient will need close follow up with PCP. If pain reoccurs or blurry vision she will need further evaluation. Her symptoms has resolved.  3. Diabetes mellitus type 2 -HB A 1 c elevated at 8. Need diet modification.She is on very high dose of insulin.   4. Hypertension - continue home medications. 5. Renal failure probably chronic - closely follow intake output and metabolic panel. 6. History of migraine. 7.   Procedures:  ECHO: pending.   Carotid doppler:   Consultations:  none  Discharge Exam: Filed Vitals:   07/26/13 1642  BP: 142/57  Pulse: 68  Temp: 98.1 F (36.7 C)  Resp: 18    General: No acute distress.  Cardiovascular: S 1, S 2 RRR Respiratory: CTA  Discharge Instructions     Medication List         aspirin 325 MG tablet  Take 325 mg by mouth daily.     desvenlafaxine 50 MG 24 hr tablet  Commonly known as:  PRISTIQ  Take 50 mg by mouth every evening.     famotidine 20 MG tablet  Commonly known as:  PEPCID  Take 1 tablet (20 mg total) by mouth 2 (two) times daily.     fish oil-omega-3 fatty acids 1000 MG capsule  Take 1 g by mouth daily.     insulin glargine 100 UNIT/ML injection  Commonly known as:  LANTUS  Inject 60-70 Units into the skin 2 (two) times daily. 70 units in the morning and 60 units at night  traMADol 50 MG tablet  Commonly known as:  ULTRAM  Take 50 mg by mouth 4 (four) times daily as needed. For pain. Maximum dose= 8 tablets per day     valsartan 160 MG tablet  Commonly known as:  DIOVAN  Take 160 mg by mouth daily.       No Known Allergies     Follow-up Information   Follow up with Dwan Bolt, MD In 3 days.   Specialty:  Endocrinology   Contact information:   [address] [place] [address] [phone]        The results of significant diagnostics from this hospitalization (including imaging, microbiology, ancillary and laboratory) are listed below for  reference.    Significant Diagnostic Studies: Dg Chest 2 View  07/25/2013   CLINICAL DATA:  Mid chest pain for 3 days  EXAM: CHEST  2 VIEW  COMPARISON:  07/31/2011; 07/15/2011; 06/11/2010; chest CT -08/14/2009  FINDINGS: Grossly unchanged borderline enlarged cardiac silhouette and mediastinal contours. Evaluation of the retrosternal clear space obscured secondary to overlying soft tissue structures. There is mild diffuse slightly nodular thickening of the pulmonary air Station. No focal airspace opacities. No pleural effusion or pneumothorax. No definite evidence of edema. Unchanged bones.  IMPRESSION: Findings suggestive of airways disease / bronchitis. No focal airspace opacities to suggest pneumonia.   Electronically Signed   By: Sandi Mariscal M.D.   On: 07/25/2013 19:15   Ct Head Wo Contrast  07/25/2013   CLINICAL DATA:  Headache and blurred vision  EXAM: CT HEAD WITHOUT CONTRAST  TECHNIQUE: Contiguous axial images were obtained from the base of the skull through the vertex without intravenous contrast. Study was performed within 24 hr of patient's arrival at the emergency department.  COMPARISON:  July 31, 2009  FINDINGS: There is mild diffuse atrophy, stable. There is moderate frontal atrophy bilaterally which is stable. There is no mass, hemorrhage, extra-axial fluid collection, or midline shift. There is mild small vessel disease in the centra semiovale bilaterally which is stable. There is no new gray-white compartment lesion. No demonstrable acute infarct. Bony calvarium appears intact. The mastoid air cells are clear.  IMPRESSION: Atrophy with mild small vessel disease, stable. No intracranial mass, hemorrhage, or acute appearing infarct.   Electronically Signed   By: Lowella Grip M.D.   On: 07/25/2013 21:32    Microbiology: No results found for this or any previous visit (from the past 240 hour(s)).   Labs: Basic Metabolic Panel:  Recent Labs Lab 07/25/13 1832 07/26/13 0230  07/26/13 0520  NA 134*  --  136  K 4.8  --  4.4  CL 98  --  101  CO2 27  --  26  GLUCOSE 341*  --  294*  BUN 26*  --  23  CREATININE 1.33* 1.29* 1.21*  CALCIUM 8.9  --  8.6   Liver Function Tests: No results found for this basename: AST, ALT, ALKPHOS, BILITOT, PROT, ALBUMIN,  in the last 168 hours No results found for this basename: LIPASE, AMYLASE,  in the last 168 hours No results found for this basename: AMMONIA,  in the last 168 hours CBC:  Recent Labs Lab 07/25/13 1832 07/26/13 0230 07/26/13 0520  WBC 6.3 5.9 6.0  NEUTROABS 4.2  --   --   HGB 12.7 12.1 11.9*  HCT 38.5 36.4 36.7  MCV 88.9 88.8 88.9  PLT 170 140* 150   Cardiac Enzymes:  Recent Labs Lab 07/26/13 0230 07/26/13 0520 07/26/13  [place] <0.30 <0.30 <0.30   BNP: BNP (last 3 results) No results found for this basename: PROBNP,  in the last 8760 hours CBG:  Recent Labs Lab 07/26/13 0107 07/26/13 0809 07/26/13 1205 07/26/13 1640  GLUCAP 314* 191* 122* 148*       Signed:  Kindall Swaby  [facility] 07/26/2013, 5:05 PM

## 2013-07-26 NOTE — Progress Notes (Signed)
Nutrition Brief Note  Patient identified on the Malnutrition Screening Tool (MST) Report for unintentional weight loss. Insignificant weight loss of a few pounds is not concerning.   Wt Readings from Last 15 Encounters:  07/26/13 210 lb 8 oz (95.482 kg)  03/15/11 214 lb (97.07 kg)  04/08/10 213 lb (96.616 kg)    Body mass index is 36.11 kg/(m^2). Patient meets criteria for obesity, class 2 based on current BMI.   Current diet order is CHO-modified, patient is consuming approximately 50% of meals at this time. Labs and medications reviewed.   No nutrition interventions warranted at this time. If nutrition issues arise, please consult RD.   Molli Barrows, RD, LDN, [place] Pager [phone] After Hours Pager [phone]

## 2013-07-26 NOTE — Progress Notes (Signed)
Bilateral carotid artery duplex:  1-39% ICA stenosis.  Vertebral artery flow is antegrade.     

## 2013-07-26 NOTE — Consult Note (Signed)
Neurology Consultation Reason for Consult: Stroke Referring Physician: Tyrell Antonio, B  CC: Stroke  History is obtained from: Patient  HPI: Tamara Fry is a 67 y.o. female who presented yesterday after a 10 minute long headache accompanied by blurred vision. This resolved and the patient had no lasting side effects. Of note, 10 days ago she had a similar episode that also lasted approximately 10 minutes and resolved completely. She does have a history of migraines, but these episodes have been different than her typical migraine. As part of her workup, she received an MRI of her brain which shows a small subacute infarction with hemorrhage in the right putamen.  She denies any left-sided symptoms.  LKW: 10 days ago tpa given?: no, outside of window    ROS: A 14 point ROS was performed and is negative except as noted in the HPI.  Past Medical History  Diagnosis Date  . Diabetes mellitus   . Hypertension   . Migraine     Family History: Grandfather-stroke  Social History: Tob: Denies, quit 30 years ago  Exam: Current vital signs: BP 151/60  Pulse 68  Temp(Src) 98.1 F (36.7 C) (Oral)  Resp 18  Ht 5\' 4"  (1.626 m)  Wt 95.482 kg (210 lb 8 oz)  BMI 36.11 kg/m2  SpO2 97% Vital signs in last 24 hours: Temp:  [97.9 F (36.6 C)-98.7 F (37.1 C)] 98.1 F (36.7 C) (12/11 2010) Pulse Rate:  [64-75] 68 (12/11 2010) Resp:  [13-23] 18 (12/11 1642) BP: (124-187)/(57-93) 151/60 mmHg (12/11 2010) SpO2:  [93 %-99 %] 97 % (12/11 2010) Weight:  [95.482 kg (210 lb 8 oz)] 95.482 kg (210 lb 8 oz) (12/11 0054)  General: In bed, NAD CV: Regular rate and rhythm Mental Status: Patient is awake, alert, oriented to person, place, month, year, and situation. Immediate and remote memory are intact. Patient is able to give a clear and coherent history. No signs of aphasia or neglect Cranial Nerves: II: Visual Fields are full. Pupils are equal, round, and reactive to light.  Discs are difficult  to visualize. III,IV, VI: EOMI without ptosis or diploplia.  V: Facial sensation is symmetric to temperature VII: Facial movement is symmetric.  VIII: hearing is intact to voice X: Uvula elevates symmetrically XI: Shoulder shrug is symmetric. XII: tongue is midline without atrophy or fasciculations.  Motor: Tone is normal. Bulk is normal. 5/5 strength was present in all four extremities.  Sensory: Sensation is symmetric to light touch and temperature in the arms and legs. Deep Tendon Reflexes: 2+ and symmetric in the biceps and patellae.  Plantars: Toes are downgoing bilaterally.  Cerebellar: FNF and HKS are intact bilaterally Gait: Patient has a stable casual gait.     I have reviewed labs in epic and the results pertinent to this consultation are: A1c-8.1 Dopplers - negative  I have reviewed the images obtained: MRI brain-small area of hemorrhage corresponding to a lacunar infarct on CT  Impression: 67 yo F with subacute infarct with small amount of hemorrhage. Though I am not certain that the hemorrhage directly caused her headache, there does seem to be a temporal correlation. She is undergoing stroke workup.  Recommendations: 1) Lipid panel 2) Echo report pending 3) No PT/OT needed as pt is asymptomatic and therefore does not need rehab 4) Holding ASA for now, though I feel that she could restart it soon.     Roland Rack, MD [facility] [phone]  If 7pm- 7am, please page neurology on call at  [phone].   

## 2013-07-26 NOTE — H&P (Addendum)
[facility] History and Physical  Tamara Fry [id number] DOB: [birth year] DOA: 07/25/2013  Referring physician: ER physician. PCP: Dwan Bolt, MD   Chief Complaint: Chest pain and headache with dizziness and blurred vision.  HPI: Tamara Fry is a 67 y.o. female history of diabetes mellitus type 2 and hypertension, migraine presented to the ER because of headache and chest pain. Patient has been experiencing headache, globally since last afternoon 1 PM. The pain is quite intense around 1 PM with some blurred vision. Patient took tramadol after which it subsided. Patient also had the same time had some chest pressure. The chest pressure lasted for around 15 minutes. Subsided by itself. It was nonradiating and no associated diaphoresis shortness of breath nausea vomiting abdominal pain. In the ER EKG and cardiac markers were negative. CT head did not show anything acute. On exam patient was nonfocal. Patient states that she usually gets headaches once or twice a week and tramadol helps it usually by this time she felt was more intense than usual and was associated with dizziness. Patient has been admitted for further management. Patient had a similar episode last week.   Review of Systems: As presented in the history of presenting illness, rest negative.  Past Medical History  Diagnosis Date  . Diabetes mellitus   . Hypertension   . Migraine    Past Surgical History  Procedure Laterality Date  . Ankle arthodesis w/ arthroscopy    . Abdominal hysterectomy    . Cholecystectomy    . Appendectomy     Social History:  reports that she has never smoked. She does not have any smokeless tobacco history on file. She reports that she does not drink alcohol or use illicit drugs. Where does patient live home. Can patient participate in ADLs? Yes.  No Known Allergies  Family History:  Family History  Problem Relation Age of Onset  . Hypertension Mother   . Diabetes Mellitus II  Father   . Prostate cancer Father       Prior to Admission medications   Medication Sig Start Date End Date Taking? Authorizing Provider  aspirin 325 MG tablet Take 325 mg by mouth daily.    Yes Historical Provider, MD  desvenlafaxine (PRISTIQ) 50 MG 24 hr tablet Take 50 mg by mouth every evening.     Yes Historical Provider, MD  fish oil-omega-3 fatty acids 1000 MG capsule Take 1 g by mouth daily.   Yes Historical Provider, MD  insulin glargine (LANTUS) 100 UNIT/ML injection Inject 60-70 Units into the skin 2 (two) times daily. 70 units in the morning and 60 units at night   Yes Historical Provider, MD  traMADol (ULTRAM) 50 MG tablet Take 50 mg by mouth 4 (four) times daily as needed. For pain. Maximum dose= 8 tablets per day    Yes Historical Provider, MD  valsartan (DIOVAN) 160 MG tablet Take 160 mg by mouth daily.     Yes Historical Provider, MD  famotidine (PEPCID) 20 MG tablet Take 1 tablet (20 mg total) by mouth 2 (two) times daily. 07/31/11 07/30/12  Carmin Muskrat, MD    Physical Exam: Filed Vitals:   07/25/13 2200 07/25/13 2253 07/25/13 2352 07/26/13 0054  BP: 144/93 143/73  187/76  Pulse: 75 74  70  Temp:  98.7 F (37.1 C) 98.4 F (36.9 C) 98.1 F (36.7 C)  TempSrc:  Oral  Oral  Resp: 23 15    Height:    5\' 4"  (1.626 m)  Weight:    95.482 kg (210 lb 8 oz)  SpO2: 96% 99%  98%     General:  Well-developed and nourished.  Eyes: Anicteric no pallor.  ENT: No discharge from ears eyes nose mouth.  Neck: No mass felt.  Cardiovascular: S1-S2 heard.  Respiratory: No rhonchi or crepitations.  Abdomen: Soft nontender bowel sounds present.  Skin: No rash.  Musculoskeletal: No edema.  Psychiatric: Appears normal.  Neurologic: Alert awake oriented to time place and person. Moves all extremities 5 x 5. No facial asymmetry. Tongue is midline. PERRLA positive.  Labs on Admission:  Basic Metabolic Panel:  Recent Labs Lab 07/25/13 1832  NA 134*  K 4.8  CL 98   CO2 27  GLUCOSE 341*  BUN 26*  CREATININE 1.33*  CALCIUM 8.9   Liver Function Tests: No results found for this basename: AST, ALT, ALKPHOS, BILITOT, PROT, ALBUMIN,  in the last 168 hours No results found for this basename: LIPASE, AMYLASE,  in the last 168 hours No results found for this basename: AMMONIA,  in the last 168 hours CBC:  Recent Labs Lab 07/25/13 1832  WBC 6.3  NEUTROABS 4.2  HGB 12.7  HCT 38.5  MCV 88.9  PLT 170   Cardiac Enzymes: No results found for this basename: CKTOTAL, CKMB, CKMBINDEX, TROPONINI,  in the last 168 hours  BNP (last 3 results) No results found for this basename: PROBNP,  in the last 8760 hours CBG: No results found for this basename: GLUCAP,  in the last 168 hours  Radiological Exams on Admission: Dg Chest 2 View  07/25/2013   CLINICAL DATA:  Mid chest pain for 3 days  EXAM: CHEST  2 VIEW  COMPARISON:  07/31/2011; 07/15/2011; 06/11/2010; chest CT -08/14/2009  FINDINGS: Grossly unchanged borderline enlarged cardiac silhouette and mediastinal contours. Evaluation of the retrosternal clear space obscured secondary to overlying soft tissue structures. There is mild diffuse slightly nodular thickening of the pulmonary air Station. No focal airspace opacities. No pleural effusion or pneumothorax. No definite evidence of edema. Unchanged bones.  IMPRESSION: Findings suggestive of airways disease / bronchitis. No focal airspace opacities to suggest pneumonia.   Electronically Signed   By: Sandi Mariscal M.D.   On: 07/25/2013 19:15   Ct Head Wo Contrast  07/25/2013   CLINICAL DATA:  Headache and blurred vision  EXAM: CT HEAD WITHOUT CONTRAST  TECHNIQUE: Contiguous axial images were obtained from the base of the skull through the vertex without intravenous contrast. Study was performed within 24 hr of patient's arrival at the emergency department.  COMPARISON:  July 31, 2009  FINDINGS: There is mild diffuse atrophy, stable. There is moderate frontal  atrophy bilaterally which is stable. There is no mass, hemorrhage, extra-axial fluid collection, or midline shift. There is mild small vessel disease in the centra semiovale bilaterally which is stable. There is no new gray-white compartment lesion. No demonstrable acute infarct. Bony calvarium appears intact. The mastoid air cells are clear.  IMPRESSION: Atrophy with mild small vessel disease, stable. No intracranial mass, hemorrhage, or acute appearing infarct.   Electronically Signed   By: Lowella Grip M.D.   On: 07/25/2013 21:32    EKG: Independently reviewed. Normal sinus rhythm with poor R-wave progression.  Assessment/Plan Principal Problem:   Chest pain Active Problems:   DIABETES MELLITUS, TYPE II   HYPERTENSION   Headache   Diabetes mellitus   1. Chest pain - presently chest pain-free. Given the history of diabetes hypertension we will cycle cardiac  markers. Check 2-D echo. Aspirin. Chest Xray shows features of bronchitis but patient has no symptoms. 2. Headaches with dizziness and transient blurred vision - may be related to her migraine. But since patient also felt dizzy check MRI brain and orthostatics. Check sedimentation rate as patient had headache and blurred vision to check for temporal arteritis. 3. Diabetes mellitus type 2 - patient's blood sugar is slightly high but not in DKA. Patient's evening dose of Lantus is to be given. Check hemoglobin A1c and closely follow CBGs with sliding-scale coverage. 4. Hypertension - continue home medications. 5. Renal failure probably chronic - closely follow intake output and metabolic panel. 6. History of migraine.    Code Status: Full code.  Family Communication: None.  Disposition Plan: Admit for observation.    Cage Gupton N. [facility] Pager [phone].  If 7PM-7AM, please contact night-coverage [email or web address] Password [email or web address] 07/26/2013, 1:02 AM

## 2013-07-26 NOTE — Progress Notes (Signed)
  Echocardiogram 2D Echocardiogram has been performed.  Diamond Nickel 07/26/2013, 12:16 PM

## 2013-07-27 DIAGNOSIS — I6350 Cerebral infarction due to unspecified occlusion or stenosis of unspecified cerebral artery: Secondary | ICD-10-CM

## 2013-07-27 LAB — GLUCOSE, CAPILLARY
Glucose-Capillary: 39 mg/dL — CL (ref 70–99)
Glucose-Capillary: 42 mg/dL — CL (ref 70–99)
Glucose-Capillary: 131 mg/dL — ABNORMAL HIGH (ref 70–99)
Glucose-Capillary: 85 mg/dL (ref 70–99)

## 2013-07-27 MED ORDER — ASPIRIN 325 MG PO TABS
325.0000 mg | ORAL_TABLET | Freq: Every day | ORAL | Status: DC
  Filled 2013-07-27: qty 1

## 2013-07-27 MED ORDER — STROKE: EARLY STAGES OF RECOVERY BOOK
Freq: Once | Status: DC
  Filled 2013-07-27: qty 1

## 2013-07-27 MED ORDER — SIMVASTATIN 20 MG PO TABS: 20.0000 mg | ORAL_TABLET | Freq: Every day | ORAL | Status: DC

## 2013-07-27 MED ORDER — INSULIN GLARGINE 100 UNIT/ML ~~LOC~~ SOLN
20.0000 [IU] | Freq: Every day | SUBCUTANEOUS | Status: DC
  Filled 2013-07-27: qty 0.2

## 2013-07-27 MED ORDER — INSULIN GLARGINE 100 UNIT/ML ~~LOC~~ SOLN: SUBCUTANEOUS | Status: DC

## 2013-07-27 MED ORDER — SIMVASTATIN 20 MG PO TABS
20.0000 mg | ORAL_TABLET | Freq: Every day | ORAL | Status: DC
  Filled 2013-07-27: qty 1

## 2013-07-27 NOTE — Discharge Summary (Signed)
Physician Discharge Summary  Tamara Fry [id number] DOB: [birth year] DOA: 07/25/2013  PCP: Dwan Bolt, MD  Admit date: 07/25/2013 Discharge date: 07/27/2013  Time spent: 35 minutes  Recommendations for Outpatient Follow-up:  1. Need better diabetes control.  2. Need to follow up with cardiology for further evaluation of cardiomyopathy.   Discharge Diagnoses:    Sub acute lacunar infarct.    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   Diet recommendation: Carb Modified.   Filed Weights   07/26/13 0054 07/27/13 0400  Weight: 95.482 kg (210 lb 8 oz) 94.802 kg (209 lb)    History of present illness:  Tamara Fry is a 67 y.o. female history of diabetes mellitus type 2 and hypertension, migraine presented to the ER because of headache and chest pain. Patient has been experiencing headache, globally since last afternoon 1 PM. The pain is quite intense around 1 PM with some blurred vision. Patient took tramadol after which it subsided. Patient also had the same time had some chest pressure. The chest pressure lasted for around 15 minutes. Subsided by itself. It was nonradiating and no associated diaphoresis shortness of breath nausea vomiting abdominal pain. In the ER EKG and cardiac markers were negative. CT head did not show anything acute. On exam patient was nonfocal. Patient states that she usually gets headaches once or twice a week and tramadol helps it usually by this time she felt was more intense than usual and was associated with dizziness. Patient has been admitted for further management. Patient had a similar episode last week.    Hospital Course:  1. Chest pain this has resolved. ECHO show cardiomyopathy EF 40 %. She denies chest pain on exertion.  Troponin times 3 negative. Continue with aspirin. She wants to follow up with cardiology outpatient. She  decline inpatient evaluation. She wants, need to go home.  2. Subacute hemorrhage within a right putaminal lacunar type infarct; Headaches with dizziness and transient blurred vision - MRI show subacute lacunar stroke with hemorrhage. Neurology consulted. Risks factor modifications. Started on statin. Ok to continue with aspirin.  3. Diabetes mellitus type 2 -HB A 1 c elevated at 8. Need diet modification. BS low this morning. Will adjust home regimen. Reduce lantus to once a day.  4. Hypertension - continue home medications. 5. Renal failure probably chronic - closely follow intake output and metabolic panel. 6. History of migraine. 7.   Procedures:  ECHO: pending.   Carotid doppler:   Consultations:  none  Discharge Exam: Filed Vitals:   07/27/13 0900  BP: 136/58  Pulse: 65  Temp: 98.1 F (36.7 C)  Resp:     General: No acute distress.  Cardiovascular: S 1, S 2 RRR Respiratory: CTA  Discharge Instructions      Discharge Orders   Future Orders Complete By Expires   Diet - low sodium heart healthy  As directed    Increase activity slowly  As directed        Medication List         aspirin 325 MG tablet  Take 325 mg by mouth daily.     desvenlafaxine 50 MG 24 hr tablet  Commonly known as:  PRISTIQ  Take 50 mg by mouth every evening.     famotidine 20 MG tablet  Commonly known as:  PEPCID  Take 1 tablet (20  mg total) by mouth 2 (two) times daily.     fish oil-omega-3 fatty acids 1000 MG capsule  Take 1 g by mouth daily.     insulin glargine 100 UNIT/ML injection  Commonly known as:  LANTUS  Inject 40 units insulin daily starting 12-13     simvastatin 20 MG tablet  Commonly known as:  ZOCOR  Take 1 tablet (20 mg total) by mouth daily at 6 PM.     traMADol 50 MG tablet  Commonly known as:  ULTRAM  Take 50 mg by mouth 4 (four) times daily as needed. For pain. Maximum dose= 8 tablets per day     valsartan 160 MG tablet  Commonly known as:  DIOVAN   Take 160 mg by mouth daily.       No Known Allergies Follow-up Information   Follow up with Dwan Bolt, MD In 2 days.   Specialty:  Endocrinology   Contact information:   [place] [address] [phone]       Follow up with Forbes Cellar, MD In 3 weeks.   Specialties:  Neurology, Radiology   Contact information:   [address] [place] [address] [phone]        The results of significant diagnostics from this hospitalization (including imaging, microbiology, ancillary and laboratory) are listed below for reference.    Significant Diagnostic Studies: Dg Chest 2 View  07/25/2013   CLINICAL DATA:  Mid chest pain for 3 days  EXAM: CHEST  2 VIEW  COMPARISON:  07/31/2011; 07/15/2011; 06/11/2010; chest CT -08/14/2009  FINDINGS: Grossly unchanged borderline enlarged cardiac silhouette and mediastinal contours. Evaluation of the retrosternal clear space obscured secondary to overlying soft tissue structures. There is mild diffuse slightly nodular thickening of the pulmonary air Station. No focal airspace opacities. No pleural effusion or pneumothorax. No definite evidence of edema. Unchanged bones.  IMPRESSION: Findings suggestive of airways disease / bronchitis. No focal airspace opacities to suggest pneumonia.   Electronically Signed   By: Sandi Mariscal M.D.   On: 07/25/2013 19:15   Ct Head Wo Contrast  07/25/2013   CLINICAL DATA:  Headache and blurred vision  EXAM: CT HEAD WITHOUT CONTRAST  TECHNIQUE: Contiguous axial images were obtained from the base of the skull through the vertex without intravenous contrast. Study was performed within 24 hr of patient's arrival at the emergency department.  COMPARISON:  July 31, 2009  FINDINGS: There is mild diffuse atrophy, stable. There is moderate frontal atrophy bilaterally which is stable. There is no mass, hemorrhage, extra-axial fluid collection, or midline shift. There  is mild small vessel disease in the centra semiovale bilaterally which is stable. There is no new gray-white compartment lesion. No demonstrable acute infarct. Bony calvarium appears intact. The mastoid air cells are clear.  IMPRESSION: Atrophy with mild small vessel disease, stable. No intracranial mass, hemorrhage, or acute appearing infarct.   Electronically Signed   By: Lowella Grip M.D.   On: 07/25/2013 21:32    Microbiology: No results found for this or any previous visit (from the past 240 hour(s)).   Labs: Basic Metabolic Panel:  Recent Labs Lab 07/25/13 1832 07/26/13 0230 07/26/13 0520  NA 134*  --  136  K 4.8  --  4.4  CL 98  --  101  CO2 27  --  26  GLUCOSE 341*  --  294*  BUN 26*  --  23  CREATININE 1.33* 1.29* 1.21*  CALCIUM 8.9  --  8.6   Liver Function Tests: No results found for this basename: AST, ALT, ALKPHOS, BILITOT, PROT, ALBUMIN,  in the last 168 hours No results found for this basename: LIPASE, AMYLASE,  in the last 168 hours No results found for this basename: AMMONIA,  in the last 168 hours CBC:  Recent Labs Lab 07/25/13 1832 07/26/13 0230 07/26/13 0520  WBC 6.3 5.9 6.0  NEUTROABS 4.2  --   --   HGB 12.7 12.1 11.9*  HCT 38.5 36.4 36.7  MCV 88.9 88.8 88.9  PLT 170 140* 150   Cardiac Enzymes:  Recent Labs Lab 07/26/13 0230 07/26/13 0520 07/26/13 1230  TROPONINI <0.30 <0.30 <0.30   BNP: BNP (last 3 results) No results found for this basename: PROBNP,  in the last 8760 hours CBG:  Recent Labs Lab 07/26/13 2004 07/27/13 0728 07/27/13 0730 07/27/13 0749 07/27/13 0830  GLUCAP 288* 39* 42* 56* 131*       Signed:  Eliany Mccarter  [facility] 07/27/2013, 11:28 AM

## 2013-07-27 NOTE — Progress Notes (Signed)
Stroke Team Progress Note  HISTORY  Tamara Fry is a 67 y.o. female with history significant for hypertension and diabetes who presented 07/25/2013 after a 10 minute long headache accompanied by blurred vision, dizziness, and feeling off balance. This resolved and the patient had no lasting side effects. Of note, 10 days ago she had a similar episode that also lasted approximately 10 minutes and resolved completely. She does have a history of migraines, but these episodes have been different than her typical migraine. As part of her workup, she received an MRI of her brain which shows a small subacute infarction with hemorrhage in the right putamen.  HgbA1c ~8 with LDL 155 on admission. She is not on statin therapy but takes aspirin 325 mg daily.  SUBJECTIVE  Overall she feels her condition is completely resolved. She states that she has not been completely compliant with high blood pressure and diabetes medicine because she is under a lot of stress from taking care of her husband who is in a wheelchair after a ruptured aneurysm.  OBJECTIVE Most recent Vital Signs: Filed Vitals:   07/26/13 1451 07/26/13 1642 07/26/13 2010 07/27/13 0400  BP: 129/65 142/57 151/60 143/73  Pulse: 74 68 68 73  Temp: 98 F (36.7 C) 98.1 F (36.7 C) 98.1 F (36.7 C) 97.8 F (36.6 C)  TempSrc: Oral Oral Oral Oral  Resp: 18 18    Height:      Weight:    209 lb (94.802 kg)  SpO2: 97% 93% 97% 98%   CBG (last 3)   Recent Labs  07/27/13 0730 07/27/13 0749 07/27/13 0830  GLUCAP 42* 56* 131*       MEDICATIONS  . enoxaparin (LOVENOX) injection  40 mg Subcutaneous Q24H  . famotidine  20 mg Oral BID  . insulin aspart  0-9 Units Subcutaneous TID WC  . insulin glargine  60 Units Subcutaneous QHS  . insulin glargine  70 Units Subcutaneous Daily  . irbesartan  150 mg Oral Daily  . omega-3 acid ethyl esters  1 g Oral Daily  . sodium chloride  3 mL Intravenous Q12H  . venlafaxine XR  75 mg Oral Q breakfast    PRN:  acetaminophen, acetaminophen, ondansetron (ZOFRAN) IV, ondansetron, traMADol  Diet:  Carb Control Thin liquids Activity:  Up with assistance DVT Prophylaxis: SCDs  CLINICALLY SIGNIFICANT STUDIES Basic Metabolic Panel:  Recent Labs Lab 07/25/13 1832 07/26/13 0230 07/26/13 0520  NA 134*  --  136  K 4.8  --  4.4  CL 98  --  101  CO2 27  --  26  GLUCOSE 341*  --  294*  BUN 26*  --  23  CREATININE 1.33* 1.29* 1.21*  CALCIUM 8.9  --  8.6   CBC:  Recent Labs Lab 07/25/13 1832 07/26/13 0230 07/26/13 0520  WBC 6.3 5.9 6.0  NEUTROABS 4.2  --   --   HGB 12.7 12.1 11.9*  HCT 38.5 36.4 36.7  MCV 88.9 88.8 88.9  PLT 170 140* 150   Cardiac Enzymes:  Recent Labs Lab 07/26/13 0230 07/26/13 0520 07/26/13 1230  TROPONINI <0.30 <0.30 <0.30   Lipid Panel    Component Value Date/Time   CHOL 237* 07/27/2013 0553   TRIG 191* 07/27/2013 0553   HDL 44 07/27/2013 0553   CHOLHDL 5.4 07/27/2013 0553   VLDL 38 07/27/2013 0553   LDLCALC 155* 07/27/2013 0553   HgbA1C  Lab Results  Component Value Date   HGBA1C 8.1* 07/26/2013  Dg Chest 2 View  07/25/2013   CLINICAL DATA:  Mid chest pain for 3 days  EXAM: CHEST  2 VIEW  COMPARISON:  07/31/2011; 07/15/2011; 06/11/2010; chest CT -08/14/2009  FINDINGS: Grossly unchanged borderline enlarged cardiac silhouette and mediastinal contours. Evaluation of the retrosternal clear space obscured secondary to overlying soft tissue structures. There is mild diffuse slightly nodular thickening of the pulmonary air Station. No focal airspace opacities. No pleural effusion or pneumothorax. No definite evidence of edema. Unchanged bones.  IMPRESSION: Findings suggestive of airways disease / bronchitis. No focal airspace opacities to suggest pneumonia.   Electronically Signed   By: Sandi Mariscal M.D.   On: 07/25/2013 19:15   Ct Head Wo Contrast  07/25/2013   CLINICAL DATA:  Headache and blurred vision  EXAM: CT HEAD WITHOUT CONTRAST  TECHNIQUE:  Contiguous axial images were obtained from the base of the skull through the vertex without intravenous contrast. Study was performed within 24 hr of patient's arrival at the emergency department.  COMPARISON:  July 31, 2009  FINDINGS: There is mild diffuse atrophy, stable. There is moderate frontal atrophy bilaterally which is stable. There is no mass, hemorrhage, extra-axial fluid collection, or midline shift. There is mild small vessel disease in the centra semiovale bilaterally which is stable. There is no new gray-white compartment lesion. No demonstrable acute infarct. Bony calvarium appears intact. The mastoid air cells are clear.  IMPRESSION: Atrophy with mild small vessel disease, stable. No intracranial mass, hemorrhage, or acute appearing infarct.   Electronically Signed   By: Lowella Grip M.D.   On: 07/25/2013 21:32   Mr Brain Wo Contrast  07/26/2013   CLINICAL DATA:  Headache and chest pain. Blurred vision. Stroke risk factors include diabetes mellitus and hypertension.  EXAM: MRI HEAD WITHOUT CONTRAST  TECHNIQUE: Multiplanar, multiecho pulse sequences of the brain and surrounding structures were obtained without intravenous contrast.  COMPARISON:  CT head 07/25/2013.  FINDINGS: No acute stroke. No mass lesion or hydrocephalus. No extra-axial fluid. Moderate cerebral and cerebellar atrophy. Chronic microvascular ischemic change affects the periventricular and subcortical white matter.  There is a subcentimeter lacunar type infarction affecting the right putamen There is no restricted diffusion to suggest it is acute. T1 and T2 shortening are observed suggesting it is days to weeks old. If correlates with an area of hypoattenuation on previous CT. No other areas of subacute or chronic hemorrhage are seen.  Flow voids are maintained in the carotid, basilar, and vertebral arteries. There is a moderate empty sella. Calvarium intact. No sinus or mastoid disease. Bilateral cataract extraction.   IMPRESSION: Subacute hemorrhage within a right putaminal lacunar type infarct. Findings discussed with ordering provider.  Atrophy and chronic microvascular ischemic change.   Electronically Signed   By: Rolla Flatten M.D.   On: 07/26/2013 19:25    CT of the brain  : No acute findings  MRI of the brain  : Subacute hemorrhage within right putaminal lacunar type infarct.  Appears likely several weeks old.  MRA of the brain  : NONE  2D Echocardiogram  : Left ventricle: The cavity size was normal. There was mild concentric hypertrophy. Systolic function was moderately reduced. The estimated ejection fraction was in the range of 35% to 40%. Diffuse hypokinesis.    Carotid Doppler  : Bilateral: 1-39% ICA stenosis. Vertebral artery flow is antegrade. Right: ICA/CCA ratio is 0.93. Left: ICA/CCA ratio is 1.13.   CXR  : Findings suggestive of airways disease / bronchitis. No focal  airspace opacities to suggest pneumonia.   EKG  normal EKG, normal sinus rhythm, unchanged from previous tracings, normal sinus rhythm, left axis deviation, posterior infarct age undetermined.   Therapy Recommendations None, returned to baseline  Physical Exam    General: Well-developed, well-nourished, in no acute distress; Head: Normocephalic, atraumatic. Eyes: PERRLA, EOMI Lungs: Normal respiratory effort. Heart: RRR  Abdomen: obese Extremities: No pretibial edema, distal pulses intact Neurologic:  Mental Status: Alert, oriented, thought content appropriate.  Speech fluent without evidence of aphasia.  Able to follow 3 step commands without difficulty. Cranial Nerves: II: Visual fields grossly normal, pupils equal, round, reactive to light and accommodation III,IV, VI: ptosis not present, extra-ocular motions intact bilaterally V,VII: smile symmetric VIII: hearing grossly normal bilaterally XII: midline tongue extension without atrophy or fasciculations Motor: Right : Upper extremity   5/5    Left:      Upper extremity   5/5  Lower extremity   5/5     Lower extremity   5/5 Tone and bulk:normal tone throughout; no atrophy noted Sensory: light touch intact grossly throughout, bilaterally Cerebellar: normal finger-to-nose   ASSESSMENT Tamara Fry is a 67 y.o. female presenting with headache and dizziness.  Imaging confirms a right late subacute infarct with hemorrhage in right putamen. Infarct felt to be thrombotic secondary to small vessel disease.  On aspirin 325 mg orally every day prior to admission. Continue aspirin 325 mg orally every day for secondary stroke prevention.    Hospital day # 2  TREATMENT/PLAN  Continue aspirin 325 mg orally every day for secondary stroke prevention.  Needs to improve diabteic control as HgbA1c goal <7  Takes fish-oil as outpt, Needs to be on statin therapy in setting of diabetes with goal LDL <70  Stable for discharge with f/u 2 months Dr. Leonie Man   07/27/2013 10:11 AM  I have personally obtained a history, examined the patient, evaluated imaging results, and formulated the assessment and plan of care. I agree with the above. Antony Contras, MD

## 2013-07-27 NOTE — ED Provider Notes (Signed)
Medical screening examination/treatment/procedure(s) were performed by non-physician practitioner and as supervising physician I was immediately available for consultation/collaboration.  EKG Interpretation    Date/Time:  Wednesday July 25 2013 18:12:04 EST Ventricular Rate:  78 PR Interval:  152 QRS Duration: 102 QT Interval:  382 QTC Calculation: 435 R Axis:   -34 Text Interpretation:  Normal sinus rhythm Left axis deviation Minimal voltage criteria for LVH, may be normal variant Possible Anterior infarct , age undetermined Abnormal ECG No significant change since last tracing Confirmed by YAO  MD, DAVID [phone]) on 07/25/2013 7:21:45 PM             Tamara Fry Alfonso Patten, MD 07/27/13 2339

## 2013-09-24 ENCOUNTER — Telehealth: Payer: Self-pay | Admitting: Neurology

## 2013-09-24 NOTE — Telephone Encounter (Signed)
Called patient to inform her that Dr. Leonie Man is booked and the only appt is in May that she is already set up for. I advised the patient that if she has any other problems, questions or concerns to call the office. Patient verbalized understanding.

## 2014-01-03 ENCOUNTER — Ambulatory Visit (INDEPENDENT_AMBULATORY_CARE_PROVIDER_SITE_OTHER): Payer: Medicare Other | Admitting: Neurology

## 2014-01-03 ENCOUNTER — Encounter (INDEPENDENT_AMBULATORY_CARE_PROVIDER_SITE_OTHER): Payer: Self-pay

## 2014-01-03 ENCOUNTER — Encounter: Payer: Self-pay | Admitting: Neurology

## 2014-01-03 VITALS — BP 130/64 | HR 82 | Ht 65.5 in | Wt 205.0 lb

## 2014-01-03 DIAGNOSIS — I6381 Other cerebral infarction due to occlusion or stenosis of small artery: Secondary | ICD-10-CM | POA: Insufficient documentation

## 2014-01-03 DIAGNOSIS — I6350 Cerebral infarction due to unspecified occlusion or stenosis of unspecified cerebral artery: Secondary | ICD-10-CM

## 2014-01-03 NOTE — Progress Notes (Signed)
[facility] [address] [place]. [place] [address] [phone]       OFFICE FOLLOW-UP NOTE  [name] Date of Birth:  [birth year] Medical Record Number:  [id number]   HPI: 45 year lady seen for first office followup visit for hospital admission for stroke on 07/25/13. She presented with headache and took some Ultram which subsided the headache. CT head showed no acute abnormality MRI scan showed subacute right basal ganglia infarct with small area of hemorrhage. She remained stable during the hospital stay without increase in hemorrhage size. Blood pressure slightly controlled. She was found to have hypertension and diabetes as risk factors. She stages done well since discharge however she states that she noted increasing migraine frequency to 3-4 times per week. She takes Ultram which seems to work quite well. She states her blood pressure control has been good. She recently had a physical by her primary physician Dr. Wilson Fry and states that her cholesterol and sugars were okay. Her fasting sugars usually 1 in the 90-110 range. She complains of mild transient dizziness particularly when she gets up and makes a sudden movement. She is usually would hold off avoid falling down. She does complain of mild numbness in her feet but she has never been diagnosed with a neuropathy.  ROS:   14 system review of systems is positive for headache, depression, joint pain, aching muscles, neck pain, moles,  PMH:  Past Medical History  Diagnosis Date  . Diabetes mellitus   . Hypertension   . Migraine     Social History:  History   Social History  . Marital Status: Married    Spouse Name: N/A    Number of Children: N/A  . Years of Education: N/A   Occupational History  . Not on file.   Social History Main Topics  . Smoking status: Never Smoker   . Smokeless tobacco: Not on file  . Alcohol Use: No  . Drug Use: No  . Sexual Activity: Not on file   Other Topics Concern    . Not on file   Social History Narrative  . No narrative on file    Medications:   Current Outpatient Prescriptions on File Prior to Visit  Medication Sig Dispense Refill  . aspirin 325 MG tablet Take 325 mg by mouth daily.       Marland Kitchen desvenlafaxine (PRISTIQ) 50 MG 24 hr tablet Take 50 mg by mouth every evening.        . fish oil-omega-3 fatty acids 1000 MG capsule Take 1 g by mouth daily.      . insulin glargine (LANTUS) 100 UNIT/ML injection Inject 40 units insulin daily starting 12-13  10 mL  12  . simvastatin (ZOCOR) 20 MG tablet Take 1 tablet (20 mg total) by mouth daily at 6 PM.  30 tablet  0  . traMADol (ULTRAM) 50 MG tablet Take 50 mg by mouth 4 (four) times daily as needed. For pain. Maximum dose= 8 tablets per day       . valsartan (DIOVAN) 160 MG tablet Take 160 mg by mouth daily.        . famotidine (PEPCID) 20 MG tablet Take 1 tablet (20 mg total) by mouth 2 (two) times daily.  30 tablet  0   No current facility-administered medications on file prior to visit.    Allergies:  No Known Allergies  Physical Exam General: well developed, well nourished middle-aged lady, seated, in no evident distress Head: head  normocephalic and atraumatic. Orohparynx benign Neck: supple with no carotid or supraclavicular bruits Cardiovascular: regular rate and rhythm, no murmurs Musculoskeletal: no deformity Skin:  no rash/petichiae Vascular:  Normal pulses all extremities Filed Vitals:   01/03/14 1356  BP: 130/64  Pulse: 82   Neurologic Exam Mental Status: Awake and fully alert. Oriented to place and time. Recent and remote memory intact. Attention span, concentration and fund of knowledge appropriate. Mood and affect appropriate.  Cranial Nerves: Fundoscopic exam reveals sharp disc margins. Pupils equal, briskly reactive to light. Extraocular movements full without nystagmus. Visual fields full to confrontation. Hearing intact. Facial sensation intact. Face, tongue, palate moves  normally and symmetrically.  Motor: Normal bulk and tone. Normal strength in all tested extremity muscles. Sensory.: intact to touch and pinprick and vibratory sensation. Diminished vibration sensation from ankle down. Positive Romberg sign Coordination: Rapid alternating movements normal in all extremities. Finger-to-nose and heel-to-shin performed accurately bilaterally. Gait and Station: Arises from chair without difficulty. Stance is slightly broad. Gait demonstrates normal stride length and mild imbalance . Unable to heel, toe and tandem walk without difficulty.  Reflexes: 1+ and symmetric except ankle jerks are. Toes downgoing.   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 Mild diabetic peripheral sensory neuropathy    PLAN: I had a long discussion with the patient with regards to her recent stroke, discussed the risk factors and secondary prevention strategies and answered questions. Continue Plavix for secondary stroke prevention and strict control of diabetes with hemoglobin A1c goes below 6.5%, lipids with HDL cholesterol goal below 70 mg percent and hypertension with blood pressure goal below 130/90. I also advised her to orthostatic tolerance exercises for her dizziness  . Return for followup in 3 months with Jeani Hawking. Lam NP or call earlier  if necessary    Note: This document was prepared with digital dictation and possible smart phrase technology. Any transcriptional errors that result from this process are unintentional

## 2014-01-03 NOTE — Patient Instructions (Addendum)
I had a long discussion with the patient with regards to her recent stroke, discussed the risk factors and secondary prevention strategies and answered questions. Continue Plavix for secondary stroke prevention and strict control of diabetes with hemoglobin A1c goes below 6.5%, lipids with HDL cholesterol goal below 70 mg percent and hypertension with blood pressure goal below 130/90. I also advised her to orthostatic tolerance exercises for her dizziness. Return for followup in 3 months with Jeani Hawking. Lam NP or call earlier  if necessary  Stroke Prevention Some medical conditions and behaviors are associated with an increased chance of having a stroke. You may prevent a stroke by making healthy choices and managing medical conditions. HOW CAN I REDUCE MY RISK OF HAVING A STROKE?   Stay physically active. Get at least 30 minutes of activity on most or all days.  Do not smoke. It may also be helpful to avoid exposure to secondhand smoke.  Limit alcohol use. Moderate alcohol use is considered to be:  No more than 2 drinks per day for men.  No more than 1 drink per day for nonpregnant women.  Eat healthy foods. This involves  Eating 5 or more servings of fruits and vegetables a day.  Following a diet that addresses high blood pressure (hypertension), high cholesterol, diabetes, or obesity.  Manage your cholesterol levels.  A diet low in saturated fat, trans fat, and cholesterol and high in fiber may control cholesterol levels.  Take any prescribed medicines to control cholesterol as directed by your health care provider.  Manage your diabetes.  A controlled-carbohydrate, controlled-sugar diet is recommended to manage diabetes.  Take any prescribed medicines to control diabetes as directed by your health care provider.  Control your hypertension.  A low-salt (sodium), low-saturated fat, low-trans fat, and low-cholesterol diet is recommended to manage hypertension.  Take any prescribed  medicines to control hypertension as directed by your health care provider.  Maintain a healthy weight.  A reduced-calorie, low-sodium, low-saturated fat, low-trans fat, low-cholesterol diet is recommended to manage weight.  Stop drug abuse.  Avoid taking birth control pills.  Talk to your health care provider about the risks of taking birth control pills if you are over 60 years old, smoke, get migraines, or have ever had a blood clot.  Get evaluated for sleep disorders (sleep apnea).  Talk to your health care provider about getting a sleep evaluation if you snore a lot or have excessive sleepiness.  Take medicines as directed by your health care provider.  For some people, aspirin or blood thinners (anticoagulants) are helpful in reducing the risk of forming abnormal blood clots that can lead to stroke. If you have the irregular heart rhythm of atrial fibrillation, you should be on a blood thinner unless there is a good reason you cannot take them.  Understand all your medicine instructions.  Make sure that other other conditions (such as anemia or atherosclerosis) are addressed. SEEK IMMEDIATE MEDICAL CARE IF:   You have sudden weakness or numbness of the face, arm, or leg, especially on one side of the body.  Your face or eyelid droops to one side.  You have sudden confusion.  You have trouble speaking (aphasia) or understanding.  You have sudden trouble seeing in one or both eyes.  You have sudden trouble walking.  You have dizziness.  You have a loss of balance or coordination.  You have a sudden, severe headache with no known cause.  You have new chest pain or an irregular heartbeat.  Any of these symptoms may represent a serious problem that is an emergency. Do not wait to see if the symptoms will go away. Get medical help at once. Call your local emergency services  (911 in [place] Do not drive yourself to the hospital. Document Released: 09/09/2004 Document  Revised: 05/23/2013 Document Reviewed: 02/02/2013 [facility] Patient Information 2014 [employer].

## 2014-03-18 ENCOUNTER — Emergency Department (HOSPITAL_COMMUNITY): Payer: Medicare Other

## 2014-03-18 ENCOUNTER — Encounter (HOSPITAL_COMMUNITY): Payer: Self-pay | Admitting: Emergency Medicine

## 2014-03-18 ENCOUNTER — Emergency Department (HOSPITAL_COMMUNITY)
Admission: EM | Admit: 2014-03-18 | Discharge: 2014-03-19 | Disposition: A | Discharge status: Home / Self Care | Payer: Medicare Other | Attending: Emergency Medicine | Admitting: Emergency Medicine

## 2014-03-18 DIAGNOSIS — M62838 Other muscle spasm: Secondary | ICD-10-CM | POA: Insufficient documentation

## 2014-03-18 DIAGNOSIS — E119 Type 2 diabetes mellitus without complications: Secondary | ICD-10-CM | POA: Diagnosis not present

## 2014-03-18 DIAGNOSIS — R443 Hallucinations, unspecified: Secondary | ICD-10-CM | POA: Insufficient documentation

## 2014-03-18 DIAGNOSIS — M542 Cervicalgia: Secondary | ICD-10-CM | POA: Insufficient documentation

## 2014-03-18 DIAGNOSIS — G43909 Migraine, unspecified, not intractable, without status migrainosus: Secondary | ICD-10-CM | POA: Diagnosis not present

## 2014-03-18 DIAGNOSIS — R51 Headache: Secondary | ICD-10-CM | POA: Diagnosis present

## 2014-03-18 DIAGNOSIS — Z79899 Other long term (current) drug therapy: Secondary | ICD-10-CM | POA: Insufficient documentation

## 2014-03-18 DIAGNOSIS — I10 Essential (primary) hypertension: Secondary | ICD-10-CM | POA: Diagnosis not present

## 2014-03-18 DIAGNOSIS — R410 Disorientation, unspecified: Secondary | ICD-10-CM

## 2014-03-18 DIAGNOSIS — Z7982 Long term (current) use of aspirin: Secondary | ICD-10-CM | POA: Diagnosis not present

## 2014-03-18 DIAGNOSIS — R739 Hyperglycemia, unspecified: Secondary | ICD-10-CM

## 2014-03-18 DIAGNOSIS — Z86711 Personal history of pulmonary embolism: Secondary | ICD-10-CM | POA: Diagnosis not present

## 2014-03-18 DIAGNOSIS — Z794 Long term (current) use of insulin: Secondary | ICD-10-CM | POA: Insufficient documentation

## 2014-03-18 DIAGNOSIS — R4182 Altered mental status, unspecified: Secondary | ICD-10-CM

## 2014-03-18 LAB — COMPREHENSIVE METABOLIC PANEL
ALBUMIN: 3.7 g/dL (ref 3.5–5.2)
ALT: 11 U/L (ref 0–35)
AST: 16 U/L (ref 0–37)
Alkaline Phosphatase: 85 U/L (ref 39–117)
Anion gap: 17 — ABNORMAL HIGH (ref 5–15)
BUN: 30 mg/dL — ABNORMAL HIGH (ref 6–23)
CALCIUM: 9.3 mg/dL (ref 8.4–10.5)
CO2: 21 mEq/L (ref 19–32)
CREATININE: 1.84 mg/dL — AB (ref 0.50–1.10)
Chloride: 96 mEq/L (ref 96–112)
GFR calc Af Amer: 31 mL/min — ABNORMAL LOW (ref >90)
GFR calc non Af Amer: 27 mL/min — ABNORMAL LOW (ref >90)
Glucose, Bld: 511 mg/dL — ABNORMAL HIGH (ref 70–99)
Potassium: 4.7 mEq/L (ref 3.7–5.3)
Sodium: 134 mEq/L — ABNORMAL LOW (ref 137–147)
Total Bilirubin: 0.5 mg/dL (ref 0.3–1.2)
Total Protein: 6.9 g/dL (ref 6.0–8.3)

## 2014-03-18 LAB — DIFFERENTIAL
BASOS ABS: 0 10*3/uL (ref 0.0–0.1)
BASOS PCT: 0 % (ref 0–1)
EOS PCT: 3 % (ref 0–5)
Eosinophils Absolute: 0.2 10*3/uL (ref 0.0–0.7)
Lymphocytes Relative: 23 % (ref 12–46)
Lymphs Abs: 1.6 10*3/uL (ref 0.7–4.0)
MONO ABS: 0.5 10*3/uL (ref 0.1–1.0)
Monocytes Relative: 8 % (ref 3–12)
NEUTROS ABS: 4.6 10*3/uL (ref 1.7–7.7)
Neutrophils Relative %: 66 % (ref 43–77)

## 2014-03-18 LAB — APTT: aPTT: 29 seconds (ref 24–37)

## 2014-03-18 LAB — URINE MICROSCOPIC-ADD ON

## 2014-03-18 LAB — URINALYSIS, ROUTINE W REFLEX MICROSCOPIC
BILIRUBIN URINE: NEGATIVE
Hgb urine dipstick: NEGATIVE
Ketones, ur: NEGATIVE mg/dL
LEUKOCYTES UA: NEGATIVE
Nitrite: POSITIVE — AB
PH: 5 (ref 5.0–8.0)
Protein, ur: NEGATIVE mg/dL
Specific Gravity, Urine: 1.026 (ref 1.005–1.030)
Urobilinogen, UA: 0.2 mg/dL (ref 0.0–1.0)

## 2014-03-18 LAB — RAPID URINE DRUG SCREEN, HOSP PERFORMED
Amphetamines: NOT DETECTED
Barbiturates: NOT DETECTED
Benzodiazepines: NOT DETECTED
COCAINE: NOT DETECTED
Opiates: NOT DETECTED
Tetrahydrocannabinol: NOT DETECTED

## 2014-03-18 LAB — CBC
HEMATOCRIT: 37.5 % (ref 36.0–46.0)
Hemoglobin: 12.5 g/dL (ref 12.0–15.0)
MCH: 29.4 pg (ref 26.0–34.0)
MCHC: 33.3 g/dL (ref 30.0–36.0)
MCV: 88.2 fL (ref 78.0–100.0)
Platelets: 161 10*3/uL (ref 150–400)
RBC: 4.25 MIL/uL (ref 3.87–5.11)
RDW: 12.5 % (ref 11.5–15.5)
WBC: 6.9 10*3/uL (ref 4.0–10.5)

## 2014-03-18 LAB — I-STAT TROPONIN, ED: TROPONIN I, POC: 0 ng/mL (ref 0.00–0.08)

## 2014-03-18 LAB — CBG MONITORING, ED
GLUCOSE-CAPILLARY: 355 mg/dL — AB (ref 70–99)
GLUCOSE-CAPILLARY: 455 mg/dL — AB (ref 70–99)

## 2014-03-18 LAB — PROTIME-INR
INR: 0.96 (ref 0.00–1.49)
Prothrombin Time: 12.8 seconds (ref 11.6–15.2)

## 2014-03-18 MED ORDER — SODIUM CHLORIDE 0.9 % IV BOLUS (SEPSIS)
1000.0000 mL | Freq: Once | INTRAVENOUS | Status: AC
  Administered 2014-03-18: 1000 mL via INTRAVENOUS

## 2014-03-18 MED ORDER — KETOROLAC TROMETHAMINE 15 MG/ML IJ SOLN
15.0000 mg | Freq: Once | INTRAMUSCULAR | Status: AC
  Administered 2014-03-18: 15 mg via INTRAVENOUS
  Filled 2014-03-18: qty 1

## 2014-03-18 MED ORDER — ACETAMINOPHEN 500 MG PO TABS
1000.0000 mg | ORAL_TABLET | Freq: Once | ORAL | Status: AC
  Administered 2014-03-18: 1000 mg via ORAL
  Filled 2014-03-18: qty 2

## 2014-03-18 MED ORDER — BUPIVACAINE HCL 0.5 % IJ SOLN
50.0000 mL | Freq: Once | INTRAMUSCULAR | Status: AC
  Administered 2014-03-18: 50 mL
  Filled 2014-03-18: qty 50

## 2014-03-18 NOTE — ED Provider Notes (Signed)
CSN: [id number]     Arrival date & time 03/18/14  1537 History   First MD Initiated Contact with Patient 03/18/14 1734     Chief Complaint  Patient presents with  . Headache     (Consider location/radiation/quality/duration/timing/severity/associated sxs/prior Treatment) HPI  Tamara Fry is a 68 y.o. female past medical history of migraines, diabetes, hypertension, and TIA, presenting for confusion and headache. Patient endorses intermittent headaches prior to arrival states the headache is now resolved but she was concerned because she had a headache the last time she was diagnosed with a TIA. Patient's son is at bedside was also concerned about his mother's health because over the past several days she's had intermittent periods of time where her he states that she has been "talking out of her head." Patient's symptoms possibly have coincided with medication u usage, but family is not sure. Patient denies recent signs of infection, such as fever, nausea, vomiting, abdominal pain, chills, dysuria, or other associated symptoms. Patient denies chest pain or shortness of breath at this time. She also denies focal sensory changes, or weakness. Patient states that her headache is spontaneously improved it is approximately a 2/10 this time but waxes and wanes over minutes to hours. Headache is similar to prior migraine headaches the patient was concerned due to her other recent associated symptoms. Patient is unable to give further specifics regarding headache. She also has difficulty recalling specific aspects of the contusion that she's had recently. Patient's son states that her speech remains clear during these episodes but that she seems confused and disoriented, and at times referring to things which are not there.     Past Medical History  Diagnosis Date  . Diabetes mellitus   . Hypertension   . Migraine    Past Surgical History  Procedure Laterality Date  . Ankle arthodesis w/ arthroscopy     . Abdominal hysterectomy    . Cholecystectomy    . Appendectomy     Family History  Problem Relation Age of Onset  . Hypertension Mother   . Diabetes Mellitus II Father   . Prostate cancer Father    History  Substance Use Topics  . Smoking status: Never Smoker   . Smokeless tobacco: Not on file  . Alcohol Use: No   OB History   Grav Para Term Preterm Abortions TAB SAB Ect Mult Living                 Review of Systems  Constitutional: Negative.   Eyes: Negative.   Respiratory: Negative.   Cardiovascular: Negative.   Gastrointestinal: Negative.   Endocrine: Negative.   Genitourinary: Negative.   Musculoskeletal: Negative.   Skin: Negative.   Allergic/Immunologic: Negative.   Neurological: Positive for headaches.  Hematological: Negative.   Psychiatric/Behavioral: Positive for hallucinations and confusion.      Allergies  Review of patient's allergies indicates no known allergies.  Home Medications   Prior to Admission medications   Medication Sig Start Date End Date Taking? Authorizing Provider  aspirin 325 MG tablet Take 325 mg by mouth daily.    Yes Historical Provider, MD  desvenlafaxine (PRISTIQ) 50 MG 24 hr tablet Take 50 mg by mouth every evening.     Yes Historical Provider, MD  fish oil-omega-3 fatty acids 1000 MG capsule Take 1 g by mouth daily.   Yes Historical Provider, MD  insulin glargine (LANTUS) 100 UNIT/ML injection Inject 60-70 Units into the skin 2 (two) times daily. 70 units every AM,  60 units every PM   Yes Historical Provider, MD  simvastatin (ZOCOR) 20 MG tablet Take 1 tablet (20 mg total) by mouth daily at 6 PM. 07/27/13  Yes Belkys A Regalado, MD  traMADol (ULTRAM) 50 MG tablet Take 50 mg by mouth 4 (four) times daily as needed. For pain. Maximum dose= 8 tablets per day    Yes Historical Provider, MD  valsartan (DIOVAN) 160 MG tablet Take 160 mg by mouth daily.     Yes Historical Provider, MD   BP 144/78  Pulse 57  Temp(Src) 97.6 F  (36.4 C) (Oral)  Resp 18  Ht 5\' 5"  (1.651 m)  Wt 205 lb (92.987 kg)  BMI 34.11 kg/m2  SpO2 99% Physical Exam  Nursing note and vitals reviewed. Constitutional: She is oriented to person, place, and time. She appears well-developed and well-nourished. No distress.  HENT:  Head: Normocephalic and atraumatic.  Right Ear: External ear normal.  Left Ear: External ear normal.  Nose: Nose normal.  Mouth/Throat: Oropharynx is clear and moist. No oropharyngeal exudate.  Eyes: Conjunctivae and EOM are normal. Pupils are equal, round, and reactive to light. Right eye exhibits no discharge. Left eye exhibits no discharge. No scleral icterus.  Neck: Normal range of motion. Neck supple. No JVD present. No tracheal deviation present. No thyromegaly present.  Cardiovascular: Normal rate, regular rhythm, normal heart sounds and intact distal pulses.  Exam reveals no gallop and no friction rub.   No murmur heard. Pulmonary/Chest: Effort normal and breath sounds normal. No stridor. No respiratory distress. She has no wheezes. She has no rales. She exhibits no tenderness.  Abdominal: Soft. She exhibits no distension. There is no tenderness. There is no rebound and no guarding.  Musculoskeletal: Normal range of motion. She exhibits tenderness. She exhibits no edema.       Cervical back: She exhibits pain and spasm.       Back:  TTP of neck and shoulders, atraumatic.  Pt endorses frequent muscle tightness associated with her HAs.  Lymphadenopathy:    She has no cervical adenopathy.  Neurological: She is alert and oriented to person, place, and time. She has normal reflexes. She is not disoriented. She displays no atrophy, no tremor and normal reflexes. No cranial nerve deficit or sensory deficit. She exhibits normal muscle tone. She displays no seizure activity. Coordination and gait normal. GCS eye subscore is 4. GCS verbal subscore is 5. GCS motor subscore is 6.  Skin: Skin is warm and dry. No rash noted.  She is not diaphoretic. No erythema. No pallor.  Psychiatric: She has a normal mood and affect. Her behavior is normal. Judgment and thought content normal.    ED Course  NERVE BLOCK Date/Time: 03/18/2014 11:45 PM Performed by: Hoyle Sauer Authorized by: Sherwood Gambler T Consent: Verbal consent obtained. Risks and benefits: risks, benefits and alternatives were discussed Consent given by: patient Patient identity confirmed: verbally with patient Indications: pain relief Body area: head Nerve: occipital Laterality: Bilateral. Patient position: sitting Needle gauge: 20 G Location technique: anatomical landmarks Local anesthetic: lidocaine 2% without epinephrine and bupivacaine 0.5% without epinephrine Anesthetic total: 12 ml Outcome: pain improved Patient tolerance: Patient tolerated the procedure well with no immediate complications.   (including critical care time) Labs Review Labs Reviewed  COMPREHENSIVE METABOLIC PANEL - Abnormal; Notable for the following:    Sodium 134 (*)    Glucose, Bld 511 (*)    BUN 30 (*)    Creatinine, Ser 1.84 (*)  GFR calc non Af Amer 27 (*)    GFR calc Af Amer 31 (*)    Anion gap 17 (*)    All other components within normal limits  URINALYSIS, ROUTINE W REFLEX MICROSCOPIC - Abnormal; Notable for the following:    Glucose, UA >1000 (*)    Nitrite POSITIVE (*)    All other components within normal limits  URINE MICROSCOPIC-ADD ON - Abnormal; Notable for the following:    Squamous Epithelial / LPF FEW (*)    Bacteria, UA FEW (*)    All other components within normal limits  CBG MONITORING, ED - Abnormal; Notable for the following:    Glucose-Capillary 455 (*)    All other components within normal limits  CBG MONITORING, ED - Abnormal; Notable for the following:    Glucose-Capillary 355 (*)    All other components within normal limits  PROTIME-INR  APTT  CBC  DIFFERENTIAL  URINE RAPID DRUG SCREEN (HOSP PERFORMED)  I-STAT  TROPOININ, ED    Imaging Review Ct Head Wo Contrast  03/18/2014   CLINICAL DATA:  Headache  EXAM: CT HEAD WITHOUT CONTRAST  TECHNIQUE: Contiguous axial images were obtained from the base of the skull through the vertex without intravenous contrast.  COMPARISON:  07/25/2013  FINDINGS: Right basal ganglial lacunar infarct is stable. Stable mild degree of cortical volume loss with proportional ventricular prominence. No midline shift. No skull fracture. Orbits and paranasal sinuses are intact. No acute hemorrhage, infarct, or mass lesion is identified.  IMPRESSION: No acute intracranial finding.   Electronically Signed   By: Conchita Paris M.D.   On: 03/18/2014 18:23   Mr Brain Wo Contrast  03/18/2014   CLINICAL DATA:  Headache  EXAM: MRI HEAD WITHOUT CONTRAST  TECHNIQUE: Multiplanar, multiecho pulse sequences of the brain and surrounding structures were obtained without intravenous contrast.  COMPARISON:  Prior CT performed earlier on the same day.  FINDINGS: Diffuse prominence of the CSF containing spaces is compatible with generalized cerebral atrophy. Patchy supratentorial white matter changes are present, nonspecific, but likely related to mild chronic microvascular ischemic disease.  No mass lesion, midline shift, or extra-axial fluid collection. Ventricles are normal in size without evidence of hydrocephalus.  No diffusion-weighted signal abnormality is identified to suggest acute intracranial infarct. Gray-white matter differentiation is maintained. Normal flow voids are seen within the intracranial vasculature. No intracranial hemorrhage identified. Remote hemorrhagic lacunar infarct noted within the right putamen.  The cervicomedullary junction is normal. Pituitary gland is within normal limits. Pituitary stalk is midline. The globes and optic nerves demonstrate a normal appearance with normal signal intensity. The  The bone marrow signal intensity is normal. Calvarium is intact. Hyperostosis frontalis  interna noted. Visualized upper cervical spine is within normal limits.  Scalp soft tissues are unremarkable.  Paranasal sinuses are clear.  No mastoid effusion.  IMPRESSION: 1. No acute intracranial infarct or other abnormality. 2. Remote lacunar infarct within the right putamen. 3. Generalized atrophy with mild chronic small vessel ischemic disease.   Electronically Signed   By: Jeannine Boga M.D.   On: 03/18/2014 23:48     EKG Interpretation   Date/Time:  Monday March 18 2014 16:18:33 EDT Ventricular Rate:  63 PR Interval:  172 QRS Duration: 114 QT Interval:  420 QTC Calculation: 429 R Axis:   -33 Text Interpretation:  Normal sinus rhythm Left axis deviation Minimal  voltage criteria for LVH, may be normal variant Septal infarct , age  undetermined Abnormal ECG No significant change  since last tracing  Confirmed by GOLDSTON  MD, Dows [phone]) on 03/18/2014 6:50:35 PM      MDM   Final diagnoses:  Migraine without status migrainosus, not intractable, unspecified migraine type  Confusion  Hyperglycemia   Patient states she-containing tramadol for headaches for the past few weeks , this is possibly contribute factor to her suspected hallucinations or other changes in mental status. Patient appears at her baseline at this time per son. Patient is alert and oriented, no signs of active visual or auditory hallucinations on exam. Neurologic exam is nonfocal. We will screen for infectious or metabolic etiologies for patient's changes in mental status, however in the absence of any specific findings we'll need to obtain imaging studies due to concern for possible recurrence of her TIAs. Patient is currently on appropriate TIA prevention outpatient medications.  CT of the head does not show any acute intracranial abnormalities. Patient's laboratory workup was significant for markedly elevated glucose at greater than 500. We will give IV fluids to address this. Patient initially endorsed  that her headache had improved however upon reassessment states the headache is severe again, low concern for new intracranial process but will treat patient symptomatically. Patient given lidocaine and bupivacaine for shoulder and cervical anesthesia. Patient has significant neck and shoulder muscle tightness which is likely contributing to her headaches. We'll do this in lieu of possibly sedating medications due to concern for patient having altered mental status, in addition patient has requested nonsedating treatment methods for her headache.  Offered toradol and occipital nerve block.  Pt endorsed resolution of HA following Toradol, and b/l occipital block and b/l trapezius injection.  Neurology evaluation requested due to otherwise negative workup. No specific indications of etiology for patient's changes in mental status based upon laboratory workup. While her hyperglycemia is possible exacerbation of mental status changes, is not a likely explanation for all of her symptoms. Neurology is recommended MRI for further evaluation.  MRI is negative for acute intercranial process. Patient has been advised to followup with neurology as an outpatient in one to 2 weeks. Patient also is endorsing resolution of her head at this time following a dose of Toradol and cervical paraspinal and trapezius muscle injections, for pain.  Patient was noted to have elevated creatinine on laboratory workup, likely secondary to dehydration and prerenal causes. Patient would like to go home and does not want to undergo admission or further testing in the hospital. Have recommended to the patient that she receive repeat laboratory testing as an outpatient and followup with her primary care physician in the next one to 2 days.  Patient and family given return precautions for headache, hyperglycemia, and altered mental status.  Advised to return for worsening symptoms including chest pain, shortness of breath, severe headache,  intractable nausea or vomiting, fever, or chills, inability to take medications, or other acute concerns.  Advised to follow up with PCP in 1-2 days.  Patient and family in agreement with and expressed understanding of follow plan, plan of care, and return precautions.  All questions answered prior to discharge.  Patient was discharged in stable condition with family, ambulating without difficulty.  Patient care was discussed with my attending, Dr. Regenia Skeeter.      Hoyle Sauer, MD 03/19/14 [phone]

## 2014-03-18 NOTE — Consult Note (Signed)
NEURO HOSPITALIST CONSULT NOTE    Reason for Consult: HA, confusion  HPI:                                                                                                                                          Tamara Fry is an 68 y.o. 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 Tamara Fry indicated that since yesterday she has been having a mild HA that is different from her typical migraine. She also reports that yesterday evening she taking to her son over the phone and he realized that he was confused " because I mentioned that for a couple of minutes I saw people coming to the house that was not really there". She said that yesterday she was a little bit disoriented but today everything has been back to normal. Takes tramadol for pain but denies taking extra dose yesterday or using a new medication. No vertigo, double vision, focal weakness or numbness,slurred speech, imbalance, language or visual impairment. Stated that she forgets names but otherwise no concerns with her short term memory capabilities. CT brain today showed no acute abnormality. Serologies revealed slight hyponatremia, glucose 511, Cr 1.84, normal white count, probable mild UTI.  At this moment, she feels back to her baseline. Past Medical History  Diagnosis Date  . Diabetes mellitus   . Hypertension   . Migraine     Past Surgical History  Procedure Laterality Date  . Ankle arthodesis w/ arthroscopy    . Abdominal hysterectomy    . Cholecystectomy    . Appendectomy      Family History  Problem Relation Age of Onset  . Hypertension Mother   . Diabetes Mellitus II Father   . Prostate cancer Father     Family History:  Social History:  reports that she has never smoked. She does not have any smokeless tobacco history on file. She reports that she does not drink  alcohol or use illicit drugs.  No Known Allergies  MEDICATIONS:  I have reviewed the patient's current medications.   ROS:                                                                                                                                       History obtained from the patient and chart review.  General ROS: negative for - chills, fatigue, fever, night sweats, weight gain or weight loss Psychological ROS: negative for - behavioral disorder, mood swings or suicidal ideation Ophthalmic ROS: negative for - blurry vision, double vision, eye pain or loss of vision ENT ROS: negative for - epistaxis, nasal discharge, oral lesions, sore throat, tinnitus or vertigo Allergy and Immunology ROS: negative for - hives or itchy/watery eyes Hematological and Lymphatic ROS: negative for - bleeding problems, bruising or swollen lymph nodes Endocrine ROS: negative for - galactorrhea, hair pattern changes, polydipsia/polyuria or temperature intolerance Respiratory ROS: negative for - cough, hemoptysis, shortness of breath or wheezing Cardiovascular ROS: negative for - chest pain, dyspnea on exertion, edema or irregular heartbeat Gastrointestinal ROS: negative for - abdominal pain, diarrhea, hematemesis, nausea/vomiting or stool incontinence Genito-Urinary ROS: negative for - dysuria, hematuria, incontinence or urinary frequency/urgency Musculoskeletal ROS: negative for - joint swelling or muscular weakness Neurological ROS: as noted in HPI Dermatological ROS: negative for rash and skin lesion changes  Physical exam: pleasant female in no apparent distress. Blood pressure 135/62, pulse 56, temperature 97.6 F (36.4 C), temperature source Oral, resp. rate 18, height 5' 5"  (1.651 m), weight 92.987 kg (205 lb), SpO2 100.00%. Head: normocephalic. Neck: supple, no bruits, no  JVD. Cardiac: no murmurs. Lungs: clear. Abdomen: soft, no tender, no mass. Extremities: no edema. Neurologic Examination:                                                                                                      General: Mental Status: Alert, oriented x 4, thought content appropriate.  Speech fluent without evidence of aphasia.  Able to follow 3 step commands without difficulty. Can not perform serial 7's. Recalls 3/3 objects at 5 minutes. Cranial Nerves: II: Discs flat bilaterally; Visual fields grossly normal, pupils equal, round, reactive to light and accommodation III,IV, VI: ptosis not present, extra-ocular motions intact bilaterally V,VII: smile symmetric, facial light touch sensation normal bilaterally VIII: hearing normal bilaterally IX,X: gag reflex present XI: bilateral shoulder shrug XII: midline tongue extension without atrophy or fasciculations  Motor: Right : Upper extremity   5/5    Left:     Upper extremity  5/5  Lower extremity   5/5     Lower extremity   5/5 Tone and bulk:normal tone throughout; no atrophy noted Sensory: Pinprick and light touch intact throughout, bilaterally Deep Tendon Reflexes:  Right: Upper Extremity   Left: Upper extremity   biceps (C-5 to C-6) 2/4   biceps (C-5 to C-6) 2/4 tricep (C7) 2/4    triceps (C7) 2/4 Brachioradialis (C6) 2/4  Brachioradialis (C6) 2/4  Lower Extremity Lower Extremity  quadriceps (L-2 to L-4) 2/4   quadriceps (L-2 to L-4) 2/4 Achilles (S1) 2/4   Achilles (S1) 2/4  Plantars: Right: downgoing   Left: downgoing Cerebellar: normal finger-to-nose,  normal heel-to-shin test Gait:  No tested    Lab Results  Component Value Date/Time   CHOL 237* 07/27/2013  5:53 AM    Results for orders placed during the hospital encounter of 03/18/14 (from the past 48 hour(s))  PROTIME-INR     Status: None   Collection Time    03/18/14  4:10 PM      Result Value Ref Range   Prothrombin Time 12.8  11.6 - 15.2  seconds   INR 0.96  0.00 - 1.49  APTT     Status: None   Collection Time    03/18/14  4:10 PM      Result Value Ref Range   aPTT 29  24 - 37 seconds  CBC     Status: None   Collection Time    03/18/14  4:10 PM      Result Value Ref Range   WBC 6.9  4.0 - 10.5 K/uL   RBC 4.25  3.87 - 5.11 MIL/uL   Hemoglobin 12.5  12.0 - 15.0 g/dL   HCT 37.5  36.0 - 46.0 %   MCV 88.2  78.0 - 100.0 fL   MCH 29.4  26.0 - 34.0 pg   MCHC 33.3  30.0 - 36.0 g/dL   RDW 12.5  11.5 - 15.5 %   Platelets 161  150 - 400 K/uL  DIFFERENTIAL     Status: None   Collection Time    03/18/14  4:10 PM      Result Value Ref Range   Neutrophils Relative % 66  43 - 77 %   Neutro Abs 4.6  1.7 - 7.7 K/uL   Lymphocytes Relative 23  12 - 46 %   Lymphs Abs 1.6  0.7 - 4.0 K/uL   Monocytes Relative 8  3 - 12 %   Monocytes Absolute 0.5  0.1 - 1.0 K/uL   Eosinophils Relative 3  0 - 5 %   Eosinophils Absolute 0.2  0.0 - 0.7 K/uL   Basophils Relative 0  0 - 1 %   Basophils Absolute 0.0  0.0 - 0.1 K/uL  COMPREHENSIVE METABOLIC PANEL     Status: Abnormal   Collection Time    03/18/14  4:10 PM      Result Value Ref Range   Sodium 134 (*) 137 - 147 mEq/L   Potassium 4.7  3.7 - 5.3 mEq/L   Chloride 96  96 - 112 mEq/L   CO2 21  19 - 32 mEq/L   Glucose, Bld 511 (*) 70 - 99 mg/dL   BUN 30 (*) 6 - 23 mg/dL   Creatinine, Ser 1.84 (*) 0.50 - 1.10 mg/dL   Calcium 9.3  8.4 - 10.5 mg/dL   Total Protein 6.9  6.0 - 8.3 g/dL   Albumin 3.7  3.5 - 5.2 g/dL  AST 16  0 - 37 U/L   ALT 11  0 - 35 U/L   Alkaline Phosphatase 85  39 - 117 U/L   Total Bilirubin 0.5  0.3 - 1.2 mg/dL   GFR calc non Af Amer 27 (*) >90 mL/min   GFR calc Af Amer 31 (*) >90 mL/min   Comment: (NOTE)     The eGFR has been calculated using the CKD EPI equation.     This calculation has not been validated in all clinical situations.     eGFR's persistently <90 mL/min signify possible Chronic Kidney     Disease.   Anion gap 17 (*) 5 - 15  I-STAT TROPOININ, ED      Status: None   Collection Time    03/18/14  4:43 PM      Result Value Ref Range   Troponin i, poc 0.00  0.00 - 0.08 ng/mL   Comment 3            Comment: Due to the release kinetics of cTnI,     a negative result within the first hours     of the onset of symptoms does not rule out     myocardial infarction with certainty.     If myocardial infarction is still suspected,     repeat the test at appropriate intervals.  CBG MONITORING, ED     Status: Abnormal   Collection Time    03/18/14  5:40 PM      Result Value Ref Range   Glucose-Capillary 455 (*) 70 - 99 mg/dL  URINE RAPID DRUG SCREEN (HOSP PERFORMED)     Status: None   Collection Time    03/18/14  7:28 PM      Result Value Ref Range   Opiates NONE DETECTED  NONE DETECTED   Cocaine NONE DETECTED  NONE DETECTED   Benzodiazepines NONE DETECTED  NONE DETECTED   Amphetamines NONE DETECTED  NONE DETECTED   Tetrahydrocannabinol NONE DETECTED  NONE DETECTED   Barbiturates NONE DETECTED  NONE DETECTED   Comment:            DRUG SCREEN FOR MEDICAL PURPOSES     ONLY.  IF CONFIRMATION IS NEEDED     FOR ANY PURPOSE, NOTIFY LAB     WITHIN 5 DAYS.                LOWEST DETECTABLE LIMITS     FOR URINE DRUG SCREEN     Drug Class       Cutoff (ng/mL)     Amphetamine      1000     Barbiturate      200     Benzodiazepine   478     Tricyclics       295     Opiates          300     Cocaine          300     THC              50  URINALYSIS, ROUTINE W REFLEX MICROSCOPIC     Status: Abnormal   Collection Time    03/18/14  7:28 PM      Result Value Ref Range   Color, Urine YELLOW  YELLOW   APPearance CLEAR  CLEAR   Specific Gravity, Urine 1.026  1.005 - 1.030   pH 5.0  5.0 - 8.0   Glucose, UA >1000 (*) NEGATIVE mg/dL  Hgb urine dipstick NEGATIVE  NEGATIVE   Bilirubin Urine NEGATIVE  NEGATIVE   Ketones, ur NEGATIVE  NEGATIVE mg/dL   Protein, ur NEGATIVE  NEGATIVE mg/dL   Urobilinogen, UA 0.2  0.0 - 1.0 mg/dL   Nitrite POSITIVE (*)  NEGATIVE   Leukocytes, UA NEGATIVE  NEGATIVE  URINE MICROSCOPIC-ADD ON     Status: Abnormal   Collection Time    03/18/14  7:28 PM      Result Value Ref Range   Squamous Epithelial / LPF FEW (*) RARE   WBC, UA 3-6  <3 WBC/hpf   RBC / HPF 0-2  <3 RBC/hpf   Bacteria, UA FEW (*) RARE    Ct Head Wo Contrast  03/18/2014   CLINICAL DATA:  Headache  EXAM: CT HEAD WITHOUT CONTRAST  TECHNIQUE: Contiguous axial images were obtained from the base of the skull through the vertex without intravenous contrast.  COMPARISON:  07/25/2013  FINDINGS: Right basal ganglial lacunar infarct is stable. Stable mild degree of cortical volume loss with proportional ventricular prominence. No midline shift. No skull fracture. Orbits and paranasal sinuses are intact. No acute hemorrhage, infarct, or mass lesion is identified.  IMPRESSION: No acute intracranial finding.   Electronically Signed   By: Conchita Paris M.D.   On: 03/18/2014 18:23   Assessment/Plan: 68 y/o with confusion/disorientation and transient episode of visual hallucinations yesterday. Mental status seems to be intact at this moment, and the is not lateralizing findings on exam. Some metabolic derangements and ? mild UTI. Suspect this could be multifactorial, but agree with MRI brain given risk factors for stroke. Will follow up after MRI.  Dorian Pod, MD 03/18/2014, 8:29 PM [facility]

## 2014-03-18 NOTE — ED Notes (Signed)
Pt presents with a headache that started last night, denies changes in vision.  Pt states she took a "strong pill" last night that she doesn't know the name of that didn't help.  Neuro exam negative.  Pt alert and oriented X 4.

## 2014-03-18 NOTE — ED Notes (Signed)
PT reassessed because family said she was getting worse.  Went out to get patient and there was no family found.  I reassessed patient and she thinks it is 1915, denies headache, reports last time she had a headache it was a mini stroke.  No facial or extremity deficits.  Checking sugar now.  Notified CT to get CT scan done ASAP.

## 2014-03-18 NOTE — ED Notes (Signed)
After triage completed pt states she feels confused - second neuro exam completed and pt reports she cannot recall the year- pt moves all extremities without difficutly and no weakness noted.  Pt now reports pain in head as 2/10.  Pt states last time she was here with a headache she was told she had a mini stroke.

## 2014-03-19 NOTE — Discharge Instructions (Signed)

## 2014-03-19 NOTE — ED Notes (Signed)
EDP and resident aware of MRI results being back and CBG.

## 2014-03-19 NOTE — ED Provider Notes (Signed)
I saw and evaluated the patient, reviewed the resident's note and I agree with the findings and plan.   EKG Interpretation   Date/Time:  Monday March 18 2014 16:18:33 EDT Ventricular Rate:  63 PR Interval:  172 QRS Duration: 114 QT Interval:  420 QTC Calculation: 429 R Axis:   -33 Text Interpretation:  Normal sinus rhythm Left axis deviation Minimal  voltage criteria for LVH, may be normal variant Septal infarct , age  undetermined Abnormal ECG No significant change since last tracing  Confirmed by [place] ([id number]) on 03/18/2014 6:50:35 PM       Patient with intermittent headaches as well as intermittent confusion. Seemed to be more hallucinations. Workup is negative here, neurology consulted. They recommend MRI, which is negative for any acute process. Patient feels comfortable with discharge although her lab work suggests some dehydration with mildly elevated creatinine. Given fluids, and will encourage her to followup closely with her PCP.  Ephraim Hamburger, MD 03/19/14 [phone]

## 2014-04-05 ENCOUNTER — Ambulatory Visit: Payer: Medicare Other | Admitting: Nurse Practitioner

## 2014-04-19 ENCOUNTER — Encounter: Payer: Self-pay | Admitting: Neurology

## 2014-04-19 ENCOUNTER — Ambulatory Visit (INDEPENDENT_AMBULATORY_CARE_PROVIDER_SITE_OTHER): Payer: Medicare Other | Admitting: Neurology

## 2014-04-19 VITALS — BP 118/80 | HR 59 | Temp 98.4°F | Ht 64.0 in | Wt 201.0 lb

## 2014-04-19 DIAGNOSIS — I6350 Cerebral infarction due to unspecified occlusion or stenosis of unspecified cerebral artery: Secondary | ICD-10-CM

## 2014-04-19 DIAGNOSIS — I6381 Other cerebral infarction due to occlusion or stenosis of small artery: Secondary | ICD-10-CM

## 2014-04-19 DIAGNOSIS — G43909 Migraine, unspecified, not intractable, without status migrainosus: Secondary | ICD-10-CM

## 2014-04-19 MED ORDER — NORTRIPTYLINE HCL 10 MG PO CAPS: 10.0000 mg | ORAL_CAPSULE | Freq: Every day | ORAL | Status: DC

## 2014-04-19 NOTE — Patient Instructions (Addendum)
1. Start nortriptyline 10mg : Take 1 cap every other night for 1 week, then increase to 1 cap at bedtime 2. Keep a headache calendar 3. Continue to monitor BP and sugar at home 4. Follow-up in 3 months

## 2014-04-19 NOTE — Progress Notes (Signed)
NEUROLOGY CONSULTATION NOTE  [name] MRN: [id number] DOB: [birth year]  Referring provider: Dr. Anda Kraft Primary care provider: Dr. Anda Kraft  Reason for consult:  Headaches, confusion  Dear Dr Wilson Singer:  Thank you for your kind referral of Tamara Fry for consultation of the above symptoms. Although her history is well known to you, please allow me to reiterate it for the purpose of our medical record. The patient was accompanied to the clinic by her son who is also a patient of mine, who also provides collateral information. Records and images were personally reviewed where available.  HISTORY OF PRESENT ILLNESS: This is a 68 year old right-handed woman with a history of migraines since age 68, hypertension, dyslipidemia, diabetes, right putamen lacunar infarct in 07/2013 with no residual deficits, in her usual state of health until 03/17/14 while talking to her son on the phone, her son noted that she was "talking out of her head," telling him that there were people in the house and she didn't know what they were doing there. She does not recall telling him this.  Her son also reports that this has happened several times with his brother on the phone as well, but cannot given specific instances.  She presented to the ER the next day due to a different type of headache, which she reports was the same as her stroke in 07/2013.  She reports her migraines are usually over the right side, with throbbing pain lasting several hours, photophobia, occasional nausea and vomiting. She had some visual auras in the past but none recently.  She usually takes Tramadol twice a week with good effect.  She reports the different type of headache was on the left side, but stated it resolved by the time she got to the ER.  She denies any dizziness, diplopia, dysarthria, dysphagia, back pain, bowel/bladder dysfunction. She has chronic neck pain. She had an MRI brain in the ER which I personally reviewed, no acute  infarct seen.  Remote lacunar infarct in putamen noted, there is generalized cerebral atrophy and mild chronic microvascular changes. Her son reports that she has not had any more hallucinations, but her memory loss has been more noticeable.  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 A CNA comes twice a week to help her bathe him.  She reports that she does get confused sometimes at home, she can't find things, but denies missing any bill payments or getting lost driving. Family has instructed her to stop driving. She occasionally forgets to take her medications, then does remember to take it at bedtime.They deny any family history of memory problems. She denies any other visual hallucinations.  She reports sleep is poor, she usually goes to bed between 1-3am, because she helps her husband.  She had seen neurologist Dr. Leonie Man for the stroke, most recently 4 months ago. With her initial stroke, she had presented with headaches, blurred vision, and feeling off balance. Her MRI showed a small subacute infarct in the right putamen, felt to be due to small vessel disease. She continues on daily aspirin.   She recall trying Topamax and Darvocet for her headaches in the past, which worsened the pain.  Laboratory Data: Lab Results  Component Value Date   WBC 6.9 03/18/2014   HGB 12.5 03/18/2014   HCT 37.5 03/18/2014   MCV 88.2 03/18/2014   PLT 161 03/18/2014     Chemistry  Component Value Date/Time   NA 134* 03/18/2014 1610   K 4.7 03/18/2014 1610   CL 96 03/18/2014 1610   CO2 21 03/18/2014 1610   BUN 30* 03/18/2014 1610   CREATININE 1.84* 03/18/2014 1610      Component Value Date/Time   CALCIUM 9.3 03/18/2014 1610   ALKPHOS 85 03/18/2014 1610   AST 16 03/18/2014 1610   ALT 11 03/18/2014 1610   BILITOT 0.5 03/18/2014 1610     03/21/14: vitamin B12 475  PAST MEDICAL HISTORY: Past Medical History  Diagnosis Date  . Diabetes mellitus   . Hypertension   . Migraine     PAST  SURGICAL HISTORY: Past Surgical History  Procedure Laterality Date  . Ankle arthodesis w/ arthroscopy    . Abdominal hysterectomy    . Cholecystectomy    . Appendectomy      MEDICATIONS: Current Outpatient Prescriptions on File Prior to Visit  Medication Sig Dispense Refill  . aspirin 325 MG tablet Take 325 mg by mouth daily.       . insulin glargine (LANTUS) 100 UNIT/ML injection Inject 60-70 Units into the skin 2 (two) times daily. 70 units every AM, 60 units every PM      . simvastatin (ZOCOR) 20 MG tablet Take 1 tablet (20 mg total) by mouth daily at 6 PM.  30 tablet  0  . traMADol (ULTRAM) 50 MG tablet Take 50 mg by mouth 4 (four) times daily as needed. For pain. Maximum dose= 8 tablets per day       . valsartan (DIOVAN) 160 MG tablet Take 160 mg by mouth daily.         No current facility-administered medications on file prior to visit.    ALLERGIES: No Known Allergies  FAMILY HISTORY: Family History  Problem Relation Age of Onset  . Hypertension Mother   . Diabetes Mellitus II Father   . Prostate cancer Father     SOCIAL HISTORY: History   Social History  . Marital Status: Married    Spouse Name: N/A    Number of Children: N/A  . Years of Education: N/A   Occupational History  . Not on file.   Social History Main Topics  . Smoking status: Former Smoker    Types: Cigarettes  . Smokeless tobacco: Not on file  . Alcohol Use: No  . Drug Use: No  . Sexual Activity: Not on file   Other Topics Concern  . Not on file   Social History Narrative  . No narrative on file    REVIEW OF SYSTEMS: Constitutional: No fevers, chills, or sweats, no generalized fatigue, change in appetite Eyes: No visual changes, double vision, eye pain Ear, nose and throat: No hearing loss, ear pain, nasal congestion, sore throat Cardiovascular: No chest pain, palpitations Respiratory:  No shortness of breath at rest or with exertion, wheezes GastrointestinaI: No nausea, vomiting,  diarrhea, abdominal pain, fecal incontinence Genitourinary:  No dysuria, urinary retention or frequency Musculoskeletal: + chronic neck pain, no back pain Integumentary: No rash, pruritus, skin lesions Neurological: as above Psychiatric: No depression, insomnia, anxiety Endocrine: No palpitations, fatigue, diaphoresis, mood swings, change in appetite, change in weight, increased thirst Hematologic/Lymphatic:  No anemia, purpura, petechiae. Allergic/Immunologic: no itchy/runny eyes, nasal congestion, recent allergic reactions, rashes  PHYSICAL EXAM: Filed Vitals:   04/19/14 0944  BP: 118/80  Pulse: 59  Temp: 98.4 F (36.9 C)   General: No acute distress Head:  Normocephalic/atraumatic Eyes: Fundoscopic exam shows bilateral sharp discs,  no vessel changes, exudates, or hemorrhages Neck: supple, no paraspinal tenderness, full range of motion Back: No paraspinal tenderness Heart: regular rate and rhythm Lungs: Clear to auscultation bilaterally. Vascular: No carotid bruits. Skin/Extremities: No rash, no edema Neurological Exam: Mental status: alert and oriented to person, place, and time, no dysarthria or aphasia, Fund of knowledge is appropriate.  Remote memory intact.  Attention and concentration are normal.    Able to name objects and repeat phrases.  MMSE - Mini Mental State Exam 04/20/2014  Orientation to time 4  Orientation to Place 5  Registration 3  Attention/ Calculation 5  Recall 0  Language- name 2 objects 2  Language- repeat 1  Language- follow 3 step command 3  Language- read & follow direction 1  Write a sentence 1  Copy design 0  Total score 25   Had difficulty with clock drawing, see attached. Cranial nerves: CN I: not tested CN II: pupils equal, round and reactive to light, visual fields intact, fundi unremarkable. CN III, IV, VI:  full range of motion, no nystagmus, no ptosis CN V: facial sensation intact CN VII: upper and lower face symmetric CN VIII:  hearing intact to finger rub CN IX, X: gag intact, uvula midline CN XI: sternocleidomastoid and trapezius muscles intact CN XII: tongue midline Bulk & Tone: normal, no cogwheeling, no fasciculations. Motor: 5/5 throughout with no pronator drift. Sensation: decreased vibration sense below the ankles bilaterally, otherwise intact to light touch, cold, pin, joint position sense.  No extinction to double simultaneous stimulation.  Romberg test negative Deep Tendon Reflexes: +1 throughout except for absent ankle jerks bilaterally, no ankle clonus Plantar responses: downgoing bilaterally Cerebellar: no incoordination on finger to nose, heel to shin. No dysdiadochokinesia Gait: narrow-based and steady, able to tandem walk adequately. Tremor: none  IMPRESSION: This is a 68 year old right-handed woman with hypertension, hyperlipidemia, diabetes, lacunar infarct in right putamen 07/2013 with no residual deficits, episodic migraines, who presented with a different type of headache and episodes of confusion/visual hallucinations per son.  MRI brain did not show any acute changes. Headache has resolved. We discussed the possibility of complicated migraines, her son also reports that she has had worsening short-term memory, concerning for mild cognitive impairment. MMSE today 25/30.  We discussed the option for starting headache prophylactic medication, different options were discussed. She endorses poor sleep and will start low dose nortriptyline 10mg  qhs. We discussed side effects, family will monitor for any worsening cognition and drowsiness.  We discussed getting more help at home, which she refuses at this time. Her son was advised to continue to monitor home situation. Continue control of vascular risk factors for secondary stroke prevention, continue daily aspirin.  If symptoms recur, she knows to go to ER immediately.  She will keep a diary of headaches and follow-up in 3 months.  Thank you for allowing  me to participate in the care of this patient. Please do not hesitate to call for any questions or concerns.   Ellouise Newer, M.D.  CC: Dr. Wilson Singer

## 2014-04-20 ENCOUNTER — Encounter: Payer: Self-pay | Admitting: Neurology

## 2014-04-20 DIAGNOSIS — G43909 Migraine, unspecified, not intractable, without status migrainosus: Secondary | ICD-10-CM | POA: Insufficient documentation

## 2014-04-23 ENCOUNTER — Telehealth: Payer: Self-pay | Admitting: Neurology

## 2014-04-23 NOTE — Telephone Encounter (Signed)
Call about med conflict w/ new meds.CB# [id number] / Sherri S.

## 2014-04-25 NOTE — Telephone Encounter (Signed)
Pt called again f/u on the message from 04/23/14. C/B [phone]

## 2014-04-25 NOTE — Telephone Encounter (Signed)
Returned call. Did speak with patient's son/Paul. Told him that Dr. Delice Lesch is still working on med interaction message from pharmacy about amitriptyline Rx regards to med interaction with medication that the patient is already on.  I will call him back & also call the pharmacy once Dr. Delice Lesch has come up with a solution.

## 2014-04-26 MED ORDER — VENLAFAXINE HCL 37.5 MG PO TABS: 37.5000 mg | ORAL_TABLET | Freq: Every day | ORAL | Status: DC

## 2014-04-26 NOTE — Telephone Encounter (Signed)
I did call patient to see if she was actually taking Myrbetriq, she states that she is still taking medication. Advised that since she is still taking the medication and the Nortriptyline would interact with that. Dr. Delice Lesch is going to prescribe new Rx for Effexor 37.5 mg to take 1 po qhs. I will call patient's pharmacy([facility]) to cancel Nortriptyline Rx and call in new Rx for Effexor. Patient verbalized good understanding.

## 2014-04-30 ENCOUNTER — Other Ambulatory Visit: Payer: Self-pay | Admitting: Family Medicine

## 2014-04-30 ENCOUNTER — Telehealth: Payer: Self-pay | Admitting: Neurology

## 2014-04-30 DIAGNOSIS — R404 Transient alteration of awareness: Secondary | ICD-10-CM

## 2014-04-30 NOTE — Telephone Encounter (Signed)
Son has also left a Dance movement psychotherapist. Says he is still waiting to hear back regarding meds. Says Knowledge is getting worse. He would like a call back ASAP. CB# [phone] / Sherri S.

## 2014-04-30 NOTE — Telephone Encounter (Signed)
Pt's son called and requested to speak with Tiffany. Please call him back #[phone]

## 2014-04-30 NOTE — Telephone Encounter (Signed)
Schedule routine EEG tomorrow morning. We will check for infection potentially causing confusion, check CBC, CMP, TSH, vitamin B12, urinalysis. Continue Effexor. Thanks

## 2014-04-30 NOTE — Telephone Encounter (Signed)
Returned call to Lindsay. He wanted to know if patient's eeg appt for tomorrow morining could be switched to Thursday due to a scheduling conflict. Appt was changed to Thursday (9/17) @ 7:30 am.

## 2014-04-30 NOTE — Telephone Encounter (Signed)
Pt son Eddie Dibbles is calling and needs to talk to someone about mother medication [phone]

## 2014-04-30 NOTE — Telephone Encounter (Signed)
Patient is scheduled for routine eeg tomorrow @ 9:00 am. Lab orders have been placed. I spoke with pts son/Paul & he is going to bring patient in for eeg. After eeg patient will go downstairs to have labs done. Lab order left up front for p/u.

## 2014-04-30 NOTE — Telephone Encounter (Signed)
Spoke with pts son/Paul. He states patient is still "talking out of her head" & showing signs of confusion. He wants to know what is next step. He states that he doesn't want to take her to the ER because "they aren't going to do anything". Please advise.

## 2014-05-01 ENCOUNTER — Other Ambulatory Visit: Payer: Medicare Other

## 2014-05-02 ENCOUNTER — Ambulatory Visit (INDEPENDENT_AMBULATORY_CARE_PROVIDER_SITE_OTHER): Payer: Medicare Other | Admitting: Neurology

## 2014-05-02 DIAGNOSIS — R404 Transient alteration of awareness: Secondary | ICD-10-CM

## 2014-05-04 LAB — URINALYSIS
BILIRUBIN URINE: NEGATIVE
Glucose, UA: >1000 mg/dL — AB
Hgb urine dipstick: NEGATIVE
Ketones, ur: NEGATIVE mg/dL
Nitrite: NEGATIVE
Protein, ur: NEGATIVE mg/dL
SPECIFIC GRAVITY, URINE: 1.027 (ref 1.005–1.030)
Urobilinogen, UA: 1 mg/dL (ref 0.0–1.0)
pH: 5 (ref 5.0–8.0)

## 2014-05-04 LAB — CBC
HCT: 38.7 % (ref 36.0–46.0)
Hemoglobin: 12.6 g/dL (ref 12.0–15.0)
MCH: 29 pg (ref 26.0–34.0)
MCHC: 32.6 g/dL (ref 30.0–36.0)
MCV: 89.2 fL (ref 78.0–100.0)
PLATELETS: 187 10*3/uL (ref 150–400)
RBC: 4.34 MIL/uL (ref 3.87–5.11)
RDW: 13 % (ref 11.5–15.5)
WBC: 7.8 10*3/uL (ref 4.0–10.5)

## 2014-05-05 LAB — COMPREHENSIVE METABOLIC PANEL
ALT: 12 U/L (ref 0–35)
AST: 17 U/L (ref 0–37)
Albumin: 3.8 g/dL (ref 3.5–5.2)
Alkaline Phosphatase: 93 U/L (ref 39–117)
BUN: 24 mg/dL — ABNORMAL HIGH (ref 6–23)
CHLORIDE: 97 meq/L (ref 96–112)
CO2: 24 mEq/L (ref 19–32)
CREATININE: 1.54 mg/dL — AB (ref 0.50–1.10)
Calcium: 9.1 mg/dL (ref 8.4–10.5)
Glucose, Bld: 394 mg/dL — ABNORMAL HIGH (ref 70–99)
Potassium: 4.6 mEq/L (ref 3.5–5.3)
Sodium: 131 mEq/L — ABNORMAL LOW (ref 135–145)
Total Bilirubin: 0.8 mg/dL (ref 0.2–1.2)
Total Protein: 6.6 g/dL (ref 6.0–8.3)

## 2014-05-05 LAB — VITAMIN B12: Vitamin B-12: 450 pg/mL (ref 211–911)

## 2014-05-05 LAB — TSH: TSH: 1.253 u[IU]/mL (ref 0.350–4.500)

## 2014-05-06 ENCOUNTER — Telehealth: Payer: Self-pay | Admitting: Neurology

## 2014-05-06 DIAGNOSIS — R413 Other amnesia: Secondary | ICD-10-CM

## 2014-05-06 MED ORDER — DONEPEZIL HCL 10 MG PO TABS: ORAL_TABLET | ORAL | Status: DC

## 2014-05-06 NOTE — Telephone Encounter (Signed)
Pt son called and needs to talk to someone today [phone] son names is Eddie Dibbles

## 2014-05-06 NOTE — Telephone Encounter (Addendum)
Spoke to son Eddie Dibbles regarding unremarkable labwork except for elevated glucose in serum and urine, no clear signs of infection. Her routine EEG showed mild diffuse slowing, no epileptiform discharges, non-specific, can be seen with encephalopathy or neurodegenerative disorders.  Her son is very concerned that she has "no memory, can't leave her alone anymore, she just can't function.  She can't use the phone, so confused, can't rationalize everything. Nothing she does is rational or making sense." Son had to clean his paraplegic father the other day, getting ready to change sheets, when he turned around and she was trying to hang a bag on a picture frame. Last night she bought [facility], thought she lost credit card, found it, lost again. Said she spilled drink but he could not find where the spill was. Discussed concerns for neurodegenerative condition. There is an option to do a spinal tap, however I believe this may be possible Alzheimer's dementia or Lewy Body with the hallucinations she has been having.  Discussed starting Aricept, side effects and expectations from medication were discussed. He will be looking into home health or SNF options as she cannot be the primary caregiver anymore for her husband.He will speak to PCP re: social worker help on this. He knows to call our office for any problems

## 2014-05-08 NOTE — Telephone Encounter (Signed)
Spoke with pts son regarding coversation he has with Dr. Delice Lesch on Fri(9/21). He was concerned that with new Rx for Aricept and her also being on Effexor at night to help her with sleep. If both of these meds would be too much for her in the way of drowsiness. He states the pharmacist did tell him that Aricept could make patient sleepy. I did advise that he could try just the Aricept for now and hold off on the Effexor to see if the Aricept helps her with her sleep as well. He states he would try that and see how it goes.

## 2014-05-09 NOTE — Procedures (Signed)
ELECTROENCEPHALOGRAM REPORT  Date of Study: 05/02/2014  Patient's Name: Tamara Fry MRN: [id number] Date of Birth: 1945/10/28  Referring Provider: Dr. Ellouise Newer  Clinical History: This is a 68 year old woman with memory loss and episodes of altered mental status/hallucinations  Medications: Aspirin, Lantus, Zocor, Diovan, Tramadol  Technical Summary: A multichannel digital EEG recording measured by the international 10-20 system with electrodes applied with paste and impedances below 5000 ohms performed as portable with EKG monitoring in an awake and asleep patient.  Hyperventilation and photic stimulation were performed.  The digital EEG was referentially recorded, reformatted, and digitally filtered in a variety of bipolar and referential montages for optimal display.   Description: The patient is awake and asleep during the recording.  During maximal wakefulness, there is a symmetric, medium voltage 7 Hz posterior dominant rhythm that attenuates with eye opening. This is admixed with a moderate amount of diffuse 5-6 Hz theta slowing of the waking background.  During drowsiness and stage I sleep, there is an increase in theta slowing of the background with poorly formed vertex waves seen.  Hyperventilation and photic stimulation did not elicit any abnormalities.  There were no epileptiform discharges or electrographic seizures seen.    EKG lead was unremarkable.  Impression: This awake and asleep EEG is abnormal due to mild to moderate diffuse slowing of the waking background.  Clinical Correlation of the above findings indicates diffuse cerebral dysfunction that is non-specific in etiology and can be seen with hypoxic/ischemic injury, toxic/metabolic encephalopathies, neurodegenerative disorders, or medication effect.  The absence of epileptiform discharges does not rule out a clinical diagnosis of epilepsy.  Clinical correlation is advised.   Ellouise Newer, M.D.

## 2014-05-15 ENCOUNTER — Telehealth: Payer: Self-pay | Admitting: Neurology

## 2014-05-15 NOTE — Telephone Encounter (Signed)
Pt needs to talk to someone about cutting a pill in half please call [phone]

## 2014-05-15 NOTE — Telephone Encounter (Signed)
Tried calling pt back, no answer. Will try again later.

## 2014-05-16 NOTE — Telephone Encounter (Signed)
Spoke with patient today about below msg. She seems to think that Aricept is causing her to see things. States she wakes up in the middle of the night and birds are flying around the room. I did call pts son/Paul to speak with him about this to get some clarification. He states that he didn't know how true this is since she still is showing a lot of confusion about things, and that she was saying she was seeing things before she was started on the Aricept so he doesn't know what to believe. Any advisement?  Callback [phone].

## 2014-05-17 NOTE — Telephone Encounter (Signed)
Spoke with pts son/Paul & notified of advisement.

## 2014-05-17 NOTE — Telephone Encounter (Signed)
Cut pill in half for 2 weeks, then increase to 1 tab daily. Thanks

## 2014-06-16 ENCOUNTER — Other Ambulatory Visit: Payer: Self-pay

## 2014-06-16 ENCOUNTER — Encounter (HOSPITAL_COMMUNITY): Payer: Self-pay

## 2014-06-16 ENCOUNTER — Observation Stay (HOSPITAL_COMMUNITY): Payer: Medicare Other

## 2014-06-16 ENCOUNTER — Observation Stay (HOSPITAL_COMMUNITY)
Admission: EM | Admit: 2014-06-16 | Discharge: 2014-06-17 | Disposition: A | Discharge status: Home / Self Care | Payer: Medicare Other | Attending: Internal Medicine | Admitting: Internal Medicine

## 2014-06-16 DIAGNOSIS — R51 Headache: Secondary | ICD-10-CM | POA: Diagnosis present

## 2014-06-16 DIAGNOSIS — R42 Dizziness and giddiness: Principal | ICD-10-CM | POA: Insufficient documentation

## 2014-06-16 DIAGNOSIS — R519 Headache, unspecified: Secondary | ICD-10-CM

## 2014-06-16 DIAGNOSIS — Z7982 Long term (current) use of aspirin: Secondary | ICD-10-CM | POA: Diagnosis not present

## 2014-06-16 DIAGNOSIS — N179 Acute kidney failure, unspecified: Secondary | ICD-10-CM

## 2014-06-16 DIAGNOSIS — I10 Essential (primary) hypertension: Secondary | ICD-10-CM | POA: Insufficient documentation

## 2014-06-16 DIAGNOSIS — Z794 Long term (current) use of insulin: Secondary | ICD-10-CM | POA: Insufficient documentation

## 2014-06-16 DIAGNOSIS — E785 Hyperlipidemia, unspecified: Secondary | ICD-10-CM | POA: Diagnosis present

## 2014-06-16 DIAGNOSIS — Z79899 Other long term (current) drug therapy: Secondary | ICD-10-CM | POA: Insufficient documentation

## 2014-06-16 DIAGNOSIS — E119 Type 2 diabetes mellitus without complications: Secondary | ICD-10-CM | POA: Diagnosis not present

## 2014-06-16 HISTORY — DX: Hyperlipidemia, unspecified: E78.5

## 2014-06-16 LAB — COMPREHENSIVE METABOLIC PANEL
ALT: 11 U/L (ref 0–35)
AST: 16 U/L (ref 0–37)
Albumin: 3.5 g/dL (ref 3.5–5.2)
Alkaline Phosphatase: 84 U/L (ref 39–117)
Anion gap: 13 (ref 5–15)
BUN: 30 mg/dL — ABNORMAL HIGH (ref 6–23)
CO2: 24 mEq/L (ref 19–32)
Calcium: 9.5 mg/dL (ref 8.4–10.5)
Chloride: 101 mEq/L (ref 96–112)
Creatinine, Ser: 1.21 mg/dL — ABNORMAL HIGH (ref 0.50–1.10)
GFR calc Af Amer: 52 mL/min — ABNORMAL LOW (ref >90)
GFR calc non Af Amer: 45 mL/min — ABNORMAL LOW (ref >90)
Glucose, Bld: 187 mg/dL — ABNORMAL HIGH (ref 70–99)
Potassium: 4.3 mEq/L (ref 3.7–5.3)
Sodium: 138 mEq/L (ref 137–147)
Total Bilirubin: 0.3 mg/dL (ref 0.3–1.2)
Total Protein: 6.8 g/dL (ref 6.0–8.3)

## 2014-06-16 LAB — BASIC METABOLIC PANEL
Anion gap: 12 (ref 5–15)
BUN: 33 mg/dL — AB (ref 6–23)
CHLORIDE: 101 meq/L (ref 96–112)
CO2: 25 meq/L (ref 19–32)
Calcium: 9.2 mg/dL (ref 8.4–10.5)
Creatinine, Ser: 1.61 mg/dL — ABNORMAL HIGH (ref 0.50–1.10)
GFR calc Af Amer: 37 mL/min — ABNORMAL LOW (ref >90)
GFR calc non Af Amer: 32 mL/min — ABNORMAL LOW (ref >90)
GLUCOSE: 236 mg/dL — AB (ref 70–99)
POTASSIUM: 4.5 meq/L (ref 3.7–5.3)
SODIUM: 138 meq/L (ref 137–147)

## 2014-06-16 LAB — GLUCOSE, CAPILLARY
GLUCOSE-CAPILLARY: 213 mg/dL — AB (ref 70–99)
GLUCOSE-CAPILLARY: 222 mg/dL — AB (ref 70–99)
GLUCOSE-CAPILLARY: 265 mg/dL — AB (ref 70–99)
Glucose-Capillary: 181 mg/dL — ABNORMAL HIGH (ref 70–99)

## 2014-06-16 LAB — URINALYSIS, ROUTINE W REFLEX MICROSCOPIC
Glucose, UA: 100 mg/dL — AB
HGB URINE DIPSTICK: NEGATIVE
Ketones, ur: 15 mg/dL — AB
Leukocytes, UA: NEGATIVE
NITRITE: NEGATIVE
PROTEIN: NEGATIVE mg/dL
Specific Gravity, Urine: 1.026 (ref 1.005–1.030)
UROBILINOGEN UA: 1 mg/dL (ref 0.0–1.0)
pH: 5 (ref 5.0–8.0)

## 2014-06-16 LAB — HEMOGLOBIN A1C
Hgb A1c MFr Bld: 9.4 % — ABNORMAL HIGH (ref <5.7)
Mean Plasma Glucose: 223 mg/dL — ABNORMAL HIGH (ref <117)

## 2014-06-16 LAB — CBC
HCT: 38.9 % (ref 36.0–46.0)
Hemoglobin: 13.1 g/dL (ref 12.0–15.0)
MCH: 29.2 pg (ref 26.0–34.0)
MCHC: 33.7 g/dL (ref 30.0–36.0)
MCV: 86.8 fL (ref 78.0–100.0)
Platelets: 172 10*3/uL (ref 150–400)
RBC: 4.48 MIL/uL (ref 3.87–5.11)
RDW: 13.1 % (ref 11.5–15.5)
WBC: 7.9 10*3/uL (ref 4.0–10.5)

## 2014-06-16 LAB — APTT: aPTT: 28 seconds (ref 24–37)

## 2014-06-16 LAB — TROPONIN I: Troponin I: <0.3 ng/mL (ref <0.30)

## 2014-06-16 LAB — PROTIME-INR
INR: 1.03 (ref 0.00–1.49)
Prothrombin Time: 13.6 seconds (ref 11.6–15.2)

## 2014-06-16 MED ORDER — SODIUM CHLORIDE 0.9 % IV SOLN: INTRAVENOUS | Status: DC

## 2014-06-16 MED ORDER — ENOXAPARIN SODIUM 40 MG/0.4ML ~~LOC~~ SOLN
40.0000 mg | Freq: Every day | SUBCUTANEOUS | Status: DC
  Administered 2014-06-16: 40 mg via SUBCUTANEOUS
  Filled 2014-06-16 (×2): qty 0.4

## 2014-06-16 MED ORDER — METOCLOPRAMIDE HCL 5 MG/ML IJ SOLN
10.0000 mg | Freq: Once | INTRAMUSCULAR | Status: AC
  Administered 2014-06-16: 10 mg via INTRAVENOUS
  Filled 2014-06-16: qty 2

## 2014-06-16 MED ORDER — ASPIRIN 325 MG PO TABS
325.0000 mg | ORAL_TABLET | Freq: Every day | ORAL | Status: DC
  Administered 2014-06-16 – 2014-06-17 (×2): 325 mg via ORAL
  Filled 2014-06-16 (×2): qty 1

## 2014-06-16 MED ORDER — INSULIN ASPART 100 UNIT/ML ~~LOC~~ SOLN
0.0000 [IU] | Freq: Every day | SUBCUTANEOUS | Status: DC
  Administered 2014-06-16: 3 [IU] via SUBCUTANEOUS

## 2014-06-16 MED ORDER — SIMVASTATIN 20 MG PO TABS
20.0000 mg | ORAL_TABLET | Freq: Every day | ORAL | Status: DC
  Administered 2014-06-16: 20 mg via ORAL
  Filled 2014-06-16 (×2): qty 1

## 2014-06-16 MED ORDER — INSULIN GLARGINE 100 UNIT/ML ~~LOC~~ SOLN
60.0000 [IU] | Freq: Every day | SUBCUTANEOUS | Status: DC
Start: 2014-06-16 — End: 2014-06-17
  Administered 2014-06-16: 60 [IU] via SUBCUTANEOUS
  Filled 2014-06-16 (×2): qty 0.6

## 2014-06-16 MED ORDER — INSULIN GLARGINE 100 UNIT/ML ~~LOC~~ SOLN: 60.0000 [IU] | Freq: Two times a day (BID) | SUBCUTANEOUS | Status: DC

## 2014-06-16 MED ORDER — IRBESARTAN 150 MG PO TABS
150.0000 mg | ORAL_TABLET | Freq: Every day | ORAL | Status: DC
Start: 2014-06-16 — End: 2014-06-17
  Administered 2014-06-16 – 2014-06-17 (×2): 150 mg via ORAL
  Filled 2014-06-16 (×2): qty 1

## 2014-06-16 MED ORDER — KETOROLAC TROMETHAMINE 30 MG/ML IJ SOLN
30.0000 mg | Freq: Once | INTRAMUSCULAR | Status: AC
  Administered 2014-06-16: 30 mg via INTRAVENOUS
  Filled 2014-06-16: qty 1

## 2014-06-16 MED ORDER — SODIUM CHLORIDE 0.9 % IV SOLN
INTRAVENOUS | Status: DC
  Administered 2014-06-16 – 2014-06-17 (×2): via INTRAVENOUS

## 2014-06-16 MED ORDER — ACETAMINOPHEN 650 MG RE SUPP: 650.0000 mg | Freq: Four times a day (QID) | RECTAL | Status: DC | PRN

## 2014-06-16 MED ORDER — MECLIZINE HCL 25 MG PO TABS
25.0000 mg | ORAL_TABLET | Freq: Three times a day (TID) | ORAL | Status: DC | PRN
  Filled 2014-06-16: qty 1

## 2014-06-16 MED ORDER — MORPHINE SULFATE 4 MG/ML IJ SOLN
4.0000 mg | Freq: Once | INTRAMUSCULAR | Status: AC
  Administered 2014-06-16: 4 mg via INTRAVENOUS
  Filled 2014-06-16: qty 1

## 2014-06-16 MED ORDER — NORTRIPTYLINE HCL 10 MG PO CAPS
10.0000 mg | ORAL_CAPSULE | Freq: Every day | ORAL | Status: DC
Start: 2014-06-16 — End: 2014-06-17
  Administered 2014-06-16: 10 mg via ORAL
  Filled 2014-06-16 (×2): qty 1

## 2014-06-16 MED ORDER — TRAMADOL HCL 50 MG PO TABS: 50.0000 mg | ORAL_TABLET | Freq: Four times a day (QID) | ORAL | Status: DC | PRN

## 2014-06-16 MED ORDER — DONEPEZIL HCL 10 MG PO TABS
10.0000 mg | ORAL_TABLET | Freq: Every day | ORAL | Status: DC
  Administered 2014-06-16: 10 mg via ORAL
  Filled 2014-06-16 (×2): qty 1

## 2014-06-16 MED ORDER — ACETAMINOPHEN 325 MG PO TABS: 650.0000 mg | ORAL_TABLET | Freq: Four times a day (QID) | ORAL | Status: DC | PRN

## 2014-06-16 MED ORDER — SODIUM CHLORIDE 0.9 % IJ SOLN
3.0000 mL | Freq: Two times a day (BID) | INTRAMUSCULAR | Status: DC
  Administered 2014-06-16: 3 mL via INTRAVENOUS

## 2014-06-16 MED ORDER — VENLAFAXINE HCL 37.5 MG PO TABS
37.5000 mg | ORAL_TABLET | Freq: Every day | ORAL | Status: DC
  Administered 2014-06-16: 37.5 mg via ORAL
  Filled 2014-06-16 (×2): qty 1

## 2014-06-16 MED ORDER — INSULIN GLARGINE 100 UNIT/ML ~~LOC~~ SOLN
70.0000 [IU] | Freq: Every day | SUBCUTANEOUS | Status: DC
  Administered 2014-06-16: 70 [IU] via SUBCUTANEOUS
  Filled 2014-06-16 (×2): qty 0.7

## 2014-06-16 MED ORDER — INSULIN ASPART 100 UNIT/ML ~~LOC~~ SOLN
0.0000 [IU] | Freq: Three times a day (TID) | SUBCUTANEOUS | Status: DC
  Administered 2014-06-16: 3 [IU] via SUBCUTANEOUS
  Administered 2014-06-16: 2 [IU] via SUBCUTANEOUS
  Administered 2014-06-16: 3 [IU] via SUBCUTANEOUS
  Administered 2014-06-17: 1 [IU] via SUBCUTANEOUS

## 2014-06-16 NOTE — ED Notes (Signed)
Refer to downtime charting.prior to now

## 2014-06-16 NOTE — ED Provider Notes (Signed)
CSN: [id number]     Arrival date & time 06/16/14  0212 History   First MD Initiated Contact with Patient 06/16/14 0453     Chief Complaint  Patient presents with  . Dizziness     (Consider location/radiation/quality/duration/timing/severity/associated sxs/prior Treatment) HPI Patient with a history of diabetes, hypertension and migraine presents with a migraine type headache for the past day. She states it is global and gradual onset. Associated with photophobia and mild nausea. She's had lightheadedness throughout the day. It is worse with position change. Patient states that at 9 PM she began having room spinning sensation. This is associated with nausea and vomiting.she states she had tingling sensation in her bilateral hands and feet. She denied any focal weakness or numbness. This the symptoms have improved significantly.she states she only has a mild amount of room spinning sensation currently. She continues to have a generalized headache. She's had no fever or chills. She has no neck pain or stiffness. Patient does have a history of "mini strokes" in the past. Past Medical History  Diagnosis Date  . Diabetes mellitus   . Hypertension   . Migraine    Past Surgical History  Procedure Laterality Date  . Ankle arthodesis w/ arthroscopy    . Abdominal hysterectomy    . Cholecystectomy    . Appendectomy     Family History  Problem Relation Age of Onset  . Hypertension Mother   . Diabetes Mellitus II Father   . Prostate cancer Father    History  Substance Use Topics  . Smoking status: Former Smoker    Types: Cigarettes  . Smokeless tobacco: Not on file  . Alcohol Use: No   OB History    No data available     Review of Systems  Constitutional: Negative for fever and chills.  HENT: Negative for rhinorrhea and sinus pressure.   Eyes: Positive for photophobia. Negative for visual disturbance.  Respiratory: Negative for chest tightness and shortness of breath.    Cardiovascular: Negative for chest pain, palpitations and leg swelling.  Gastrointestinal: Positive for nausea and vomiting. Negative for abdominal pain, diarrhea and constipation.  Genitourinary: Negative for dysuria, frequency and flank pain.  Neurological: Positive for dizziness, light-headedness and headaches. Negative for facial asymmetry, weakness and numbness.  All other systems reviewed and are negative.     Allergies  Review of patient's allergies indicates no known allergies.  Home Medications   Prior to Admission medications   Medication Sig Start Date End Date Taking? Authorizing Provider  aspirin 325 MG tablet Take 325 mg by mouth daily.     Historical Provider, MD  donepezil (ARICEPT) 10 MG tablet Take 1 tablet daily 05/06/14   Cameron Sprang, MD  insulin glargine (LANTUS) 100 UNIT/ML injection Inject 60-70 Units into the skin 2 (two) times daily. 70 units every AM, 60 units every PM    Historical Provider, MD  nortriptyline (PAMELOR) 10 MG capsule Take 1 capsule (10 mg total) by mouth at bedtime. 04/19/14   Cameron Sprang, MD  simvastatin (ZOCOR) 20 MG tablet Take 1 tablet (20 mg total) by mouth daily at 6 PM. 07/27/13   Belkys A Regalado, MD  traMADol (ULTRAM) 50 MG tablet Take 50 mg by mouth 4 (four) times daily as needed. For pain. Maximum dose= 8 tablets per day     Historical Provider, MD  valsartan (DIOVAN) 160 MG tablet Take 160 mg by mouth daily.      Historical Provider, MD  venlafaxine [facility])  37.5 MG tablet Take 1 tablet (37.5 mg total) by mouth at bedtime. 04/26/14   Cameron Sprang, MD   BP 145/58 mmHg  Pulse 52  Temp(Src) 98.4 F (36.9 C) (Oral)  Resp 18  SpO2 96% Physical Exam  Constitutional: She is oriented to person, place, and time. She appears well-developed and well-nourished. No distress.  HENT:  Head: Normocephalic and atraumatic.  Mouth/Throat: Oropharynx is clear and moist.  Eyes: EOM are normal. Pupils are equal, round, and reactive to  light.  Neck: Normal range of motion. Neck supple.  No meningismus  Cardiovascular: Normal rate and regular rhythm.   Pulmonary/Chest: Effort normal and breath sounds normal. No respiratory distress. She has no wheezes. She has no rales. She exhibits no tenderness.  Abdominal: Soft. Bowel sounds are normal. She exhibits no distension and no mass. There is no tenderness. There is no rebound and no guarding.  Musculoskeletal: Normal range of motion. She exhibits no edema or tenderness.  Neurological: She is alert and oriented to person, place, and time.  Patient is alert and oriented x3 with clear, goal oriented speech. Patient has 5/5 motor in all extremities. Sensation is intact to light touch. Bilateral finger-to-nose is normal with no signs of dysmetria. Patient has a normal gait and walks without assistance.  Skin: Skin is warm and dry. No rash noted. No erythema.  Psychiatric: She has a normal mood and affect. Her behavior is normal.  Nursing note and vitals reviewed.   ED Course  Procedures (including critical care time) Labs Review Labs Reviewed - No data to display  Imaging Review No results found.   EKG Interpretation None      Date: 06/16/2014  Rate: 49  Rhythm: sinus bradycardia  QRS Axis: normal  Intervals: QRS is prolonged  ST/T Wave abnormalities: normal  Conduction Disutrbances:none  Narrative Interpretation:   Old EKG Reviewed: unchanged   MDM   Final diagnoses:  None   CT head without acute findings.patient's neuro exam is normal. She continues to have headache and vertigo is resolved. CT head without acute is. Discussed with Dr. Maudie Mercury and will admit to telemetry observation bed.     Julianne Rice, MD 06/16/14 [phone]

## 2014-06-16 NOTE — ED Notes (Signed)
Sherry Ruffing cell [phone]

## 2014-06-16 NOTE — Progress Notes (Addendum)
[facility] PROGRESS NOTE  BELLAMAE BIGNELL [id number] DOB: 1946-03-02 DOA: 06/16/2014 PCP: Dwan Bolt, MD Interim summary: 68 yo female with dm2, c/o vertigo x 1 day, worse last nite, Pt notes headache, and pain all over. + nausea/vomitting. Her MRI brain was neg for acute stroke.  Assessment/Plan: 1. Nausea, vomiting and diarrhea: - improved. c diff pcr pending.  Gentle hydration and symptomatic treatment.   Vertigo:  ? Benign positional vs pt vestibular eval.  Improving.    acute on CKD: US RENAL, UA, and gentle hydration, repeat labs in am.  Hypertension: controlled  Diabetes Mellitus: CBG (last 3)   Recent Labs  06/16/14 0800 06/16/14 1256 06/16/14 1612  GLUCAP 181* 213* 222*    Resume lantus and SSI.   DVT  Prophylaxis.   Code Status: full code.  Family Communication: none at bedside Disposition Plan: pending.   Consultants:  Physical therapy  Procedures:  MRI brain.   Antibiotics:  none  HPI/Subjective: Still feel dizzy when moving around. oe episode of watery diarrhea.   Objective: Filed Vitals:   06/16/14 1306  BP: 122/37  Pulse: 54  Temp: 97.1 F (36.2 C)  Resp: 18    Intake/Output Summary (Last 24 hours) at 06/16/14 1746 Last data filed at 06/16/14 1307  Gross per 24 hour  Intake      0 ml  Output      0 ml  Net      0 ml   Filed Weights   06/16/14 0735  Weight: 89.086 kg (196 lb 6.4 oz)    Exam:   General:  Alert afebrile comfortable  Cardiovascular: s1s2  Respiratory: ctab  Abdomen: soft NT Nd bowel sounds.   Musculoskeletal: no pedal edema.   Data Reviewed: Basic Metabolic Panel:  Recent Labs Lab 06/16/14 0301 06/16/14 1359  NA 138 138  K 4.3 4.5  CL 101 101  CO2 24 25  GLUCOSE 187* 236*  BUN 30* 33*  CREATININE 1.21* 1.61*  CALCIUM 9.5 9.2   Liver Function Tests:  Recent Labs Lab 06/16/14 0301  AST 16  ALT 11  ALKPHOS 84  BILITOT 0.3  PROT 6.8  ALBUMIN 3.5   No results  for input(s): LIPASE, AMYLASE in the last 168 hours. No results for input(s): AMMONIA in the last 168 hours. CBC:  Recent Labs Lab 06/16/14 0301  WBC 7.9  HGB 13.1  HCT 38.9  MCV 86.8  PLT 172   Cardiac Enzymes:  Recent Labs Lab 06/16/14 0301  TROPONINI <0.30   BNP (last 3 results) No results for input(s): PROBNP in the last 8760 hours. CBG:  Recent Labs Lab 06/16/14 0800 06/16/14 1256 06/16/14 1612  GLUCAP 181* 213* 222*    No results found for this or any previous visit (from the past 240 hour(s)).   Studies: Ct Head Wo Contrast  06/16/2014   CLINICAL DATA:  Vertigo and headache.  Migraines.  EXAM: CT head  COMPARISON:  MRI of the brain from 03/18/2014  FINDINGS: There is patchy low attenuation within the periventricular and subcortical white matter. Partial opacification of the left maxillary sinus noted. The remaining paranasal sinuses are clear. The mastoid air cells are clear. The skull is intact. There is prominence of the sulci and ventricles compatible with brain atrophy. There is no abnormal extra-axial fluid collections, intracranial hemorrhage or mass.  IMPRESSION: No acute intracranial abnormalities identified.  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   Electronically Signed   By: Queen Slough.D.  On: 06/16/2014 03:50   Mr Brain Wo Contrast  06/16/2014   CLINICAL DATA:  Vertigo  EXAM: MRI HEAD WITHOUT CONTRAST  TECHNIQUE: Multiplanar, multiecho pulse sequences of the brain and surrounding structures were obtained without intravenous contrast.  COMPARISON:  CT 06/16/2014.  MRI 03/18/2014  FINDINGS: Mild atrophy unchanged.  Negative for hydrocephalus.  Negative for acute infarct.  Chronic microvascular ischemic change in the white matter and pons of a mild degree. 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 No acute hemorrhage or mass. No edema or shift of the midline structures.  Paranasal sinuses are clear.  IMPRESSION: No  acute abnormality.  Mild chronic ischemic changes are stable.   Electronically Signed   By: Franchot Gallo M.D.   On: 06/16/2014 13:37    Scheduled Meds: . aspirin  325 mg Oral Daily  . donepezil  10 mg Oral QHS  . enoxaparin (LOVENOX) injection  40 mg Subcutaneous Daily  . insulin aspart  0-5 Units Subcutaneous QHS  . insulin aspart  0-9 Units Subcutaneous TID WC  . insulin glargine  70 Units Subcutaneous Daily   And  . insulin glargine  60 Units Subcutaneous QHS  . irbesartan  150 mg Oral Daily  . nortriptyline  10 mg Oral QHS  . simvastatin  20 mg Oral q1800  . sodium chloride  3 mL Intravenous Q12H  . venlafaxine  37.5 mg Oral QHS   Continuous Infusions: . sodium chloride      Principal Problem:   Vertigo Active Problems:   Diabetes   Hyperlipidemia   Hypertension    Time spent: 25 minutes.     [facility] Pager [phone]. If 7PM-7AM, please contact night-coverage at [email or web address], password [facility] 06/16/2014, 5:46 PM  LOS: 0 days

## 2014-06-16 NOTE — Progress Notes (Signed)
Utilization review completed.  

## 2014-06-16 NOTE — ED Notes (Signed)
repaged [facility] to [facility]

## 2014-06-16 NOTE — ED Notes (Signed)
Dr Maudie Mercury at bedside for [employer]

## 2014-06-16 NOTE — H&P (Signed)
Tamara Fry is an 68 y.o. female.    Pcp: Anda Kraft  Chief Complaint: vertigo HPI: 68 yo female with dm2, c/o vertigo x 1 day, worse last nite,  Pt notes headache,  All over.  + nausea/vomitting.  Denies fever, chills, tinnitus, hearing loss, cp, palp, sob, weakness.  + numbness, tingling in feet.   Pt came to ED for evaluation.  CT brain negative for any acute process.  Pt will be admitted for vertigo.    Past Medical History  Diagnosis Date  . Diabetes mellitus   . Hypertension   . Migraine   . Hyperlipidemia     Past Surgical History  Procedure Laterality Date  . Ankle arthodesis w/ arthroscopy    . Abdominal hysterectomy    . Cholecystectomy    . Appendectomy    . Cataract extraction      Family History  Problem Relation Age of Onset  . Hypertension Mother   . Diabetes Mellitus II Father   . Prostate cancer Father    Social History:  reports that she has quit smoking. Her smoking use included Cigarettes. She has a 5 pack-year smoking history. She does not have any smokeless tobacco history on file. She reports that she does not drink alcohol or use illicit drugs.  Allergies: No Known Allergies   (Not in a hospital admission)  Results for orders placed or performed during the hospital encounter of 06/16/14 (from the past 48 hour(s))  CBC     Status: None   Collection Time: 06/16/14  3:01 AM  Result Value Ref Range   WBC 7.9 4.0 - 10.5 K/uL   RBC 4.48 3.87 - 5.11 MIL/uL   Hemoglobin 13.1 12.0 - 15.0 g/dL   HCT 38.9 36.0 - 46.0 %   MCV 86.8 78.0 - 100.0 fL   MCH 29.2 26.0 - 34.0 pg   MCHC 33.7 30.0 - 36.0 g/dL   RDW 13.1 11.5 - 15.5 %   Platelets 172 150 - 400 K/uL  Comprehensive metabolic panel     Status: Abnormal   Collection Time: 06/16/14  3:01 AM  Result Value Ref Range   Sodium 138 137 - 147 mEq/L   Potassium 4.3 3.7 - 5.3 mEq/L   Chloride 101 96 - 112 mEq/L   CO2 24 19 - 32 mEq/L   Glucose, Bld 187 (H) 70 - 99 mg/dL   BUN 30 (H) 6 - 23 mg/dL    Creatinine, Ser 1.21 (H) 0.50 - 1.10 mg/dL   Calcium 9.5 8.4 - 10.5 mg/dL   Total Protein 6.8 6.0 - 8.3 g/dL   Albumin 3.5 3.5 - 5.2 g/dL   AST 16 0 - 37 U/L   ALT 11 0 - 35 U/L   Alkaline Phosphatase 84 39 - 117 U/L   Total Bilirubin 0.3 0.3 - 1.2 mg/dL   GFR calc non Af Amer 45 (L) >90 mL/min   GFR calc Af Amer 52 (L) >90 mL/min    Comment: (NOTE) The eGFR has been calculated using the CKD EPI equation. This calculation has not been validated in all clinical situations. eGFR's persistently <90 mL/min signify possible Chronic Kidney Disease.    Anion gap 13 5 - 15  Troponin I     Status: None   Collection Time: 06/16/14  3:01 AM  Result Value Ref Range   Troponin I <0.30 <0.30 ng/mL    Comment:        Due to the release kinetics of cTnI,  a negative result within the first hours of the onset of symptoms does not rule out myocardial infarction with certainty. If myocardial infarction is still suspected, repeat the test at appropriate intervals.   Protime-INR     Status: None   Collection Time: 06/16/14  3:01 AM  Result Value Ref Range   Prothrombin Time 13.6 11.6 - 15.2 seconds   INR 1.03 0.00 - 1.49  APTT     Status: None   Collection Time: 06/16/14  3:01 AM  Result Value Ref Range   aPTT 28 24 - 37 seconds   No results found.  Review of Systems  Constitutional: Negative for fever, chills, weight loss, malaise/fatigue and diaphoresis.  HENT: Negative for congestion, ear discharge, ear pain, hearing loss, nosebleeds, sore throat and tinnitus.   Eyes: Negative for blurred vision, double vision, photophobia, pain, discharge and redness.  Respiratory: Negative for cough, hemoptysis, sputum production, shortness of breath, wheezing and stridor.   Cardiovascular: Negative for chest pain, palpitations, orthopnea, claudication, leg swelling and PND.  Gastrointestinal: Negative for heartburn, nausea, vomiting, abdominal pain, diarrhea, constipation, blood in stool and melena.   Genitourinary: Negative for dysuria, urgency, frequency, hematuria and flank pain.  Musculoskeletal: Negative for myalgias, back pain, joint pain, falls and neck pain.  Skin: Negative for itching and rash.  Neurological: Positive for tingling and headaches. Negative for dizziness, tremors, sensory change, speech change, focal weakness, seizures, loss of consciousness and weakness.  Endo/Heme/Allergies: Negative for environmental allergies and polydipsia. Does not bruise/bleed easily.  Psychiatric/Behavioral: Negative for depression, suicidal ideas, hallucinations, memory loss and substance abuse. The patient is not nervous/anxious and does not have insomnia.     Blood pressure 127/43, pulse 55, temperature 98.4 F (36.9 C), temperature source Oral, resp. rate 13, SpO2 95 %. Physical Exam  Constitutional: She is oriented to person, place, and time. She appears well-developed and well-nourished.  HENT:  Head: Normocephalic and atraumatic.  Eyes: Conjunctivae and EOM are normal. Pupils are equal, round, and reactive to light.  Neck: Normal range of motion. Neck supple.  Cardiovascular: Normal rate and regular rhythm.  Exam reveals no gallop and no friction rub.   No murmur heard. Respiratory: Effort normal and breath sounds normal.  GI: Soft. Bowel sounds are normal. She exhibits no distension. There is no tenderness.  Musculoskeletal: Normal range of motion.  Neurological: She is alert and oriented to person, place, and time. She displays normal reflexes. No cranial nerve deficit. She exhibits normal muscle tone. Coordination normal.  Skin: Skin is warm and dry. No rash noted. No erythema. No pallor.  Psychiatric: She has a normal mood and affect. Her behavior is normal. Judgment and thought content normal.     Assessment/Plan Vertigo, may be benign positional vertigo Check MRI brain without contrast  Renal insufficiency: Ns iv, check cmp tomorrow  Dm2: fsbs ac and qhs,  iss  Hypertension: Cont current bp medications  TIA: Cont aspirin     Tamara Fry 06/16/2014, 6:01 AM

## 2014-06-17 DIAGNOSIS — E118 Type 2 diabetes mellitus with unspecified complications: Secondary | ICD-10-CM

## 2014-06-17 LAB — POCT I-STAT, CHEM 8
BUN: 33 mg/dL — AB (ref 6–23)
CALCIUM ION: 1.16 mmol/L (ref 1.13–1.30)
Chloride: 106 mEq/L (ref 96–112)
Creatinine, Ser: 1.3 mg/dL — ABNORMAL HIGH (ref 0.50–1.10)
GLUCOSE: 190 mg/dL — AB (ref 70–99)
HCT: 40 % (ref 36.0–46.0)
Hemoglobin: 13.6 g/dL (ref 12.0–15.0)
Potassium: 4.2 mEq/L (ref 3.7–5.3)
Sodium: 138 mEq/L (ref 137–147)
TCO2: 24 mmol/L (ref 0–100)

## 2014-06-17 LAB — BASIC METABOLIC PANEL
Anion gap: 13 (ref 5–15)
BUN: 30 mg/dL — ABNORMAL HIGH (ref 6–23)
CALCIUM: 8.6 mg/dL (ref 8.4–10.5)
CO2: 22 mEq/L (ref 19–32)
Chloride: 107 mEq/L (ref 96–112)
Creatinine, Ser: 1.22 mg/dL — ABNORMAL HIGH (ref 0.50–1.10)
GFR, EST AFRICAN AMERICAN: 51 mL/min — AB (ref >90)
GFR, EST NON AFRICAN AMERICAN: 44 mL/min — AB (ref >90)
GLUCOSE: 61 mg/dL — AB (ref 70–99)
Potassium: 4.6 mEq/L (ref 3.7–5.3)
SODIUM: 142 meq/L (ref 137–147)

## 2014-06-17 LAB — GLUCOSE, CAPILLARY
Glucose-Capillary: 55 mg/dL — ABNORMAL LOW (ref 70–99)
Glucose-Capillary: 142 mg/dL — ABNORMAL HIGH (ref 70–99)

## 2014-06-17 LAB — POCT I-STAT TROPONIN I: Troponin i, poc: 0.01 ng/mL (ref 0.00–0.08)

## 2014-06-17 MED ORDER — INSULIN LISPRO PROT & LISPRO (50-50 MIX) 100 UNIT/ML ~~LOC~~ SUSP: 30.0000 [IU] | Freq: Two times a day (BID) | SUBCUTANEOUS | Status: DC

## 2014-06-17 MED ORDER — INSULIN GLARGINE 100 UNIT/ML ~~LOC~~ SOLN
50.0000 [IU] | Freq: Every day | SUBCUTANEOUS | Status: DC
Start: 2014-06-17 — End: 2014-06-17
  Administered 2014-06-17: 50 [IU] via SUBCUTANEOUS
  Filled 2014-06-17: qty 0.5

## 2014-06-17 MED ORDER — HYDRALAZINE HCL 10 MG PO TABS: 10.0000 mg | ORAL_TABLET | Freq: Three times a day (TID) | ORAL | Status: DC

## 2014-06-17 MED ORDER — NEBIVOLOL HCL 10 MG PO TABS: 5.0000 mg | ORAL_TABLET | Freq: Every day | ORAL | Status: DC

## 2014-06-17 MED ORDER — HYDRALAZINE HCL 10 MG PO TABS
10.0000 mg | ORAL_TABLET | Freq: Three times a day (TID) | ORAL | Status: DC
  Filled 2014-06-17 (×4): qty 1

## 2014-06-17 MED ORDER — INSULIN GLARGINE 100 UNIT/ML ~~LOC~~ SOLN
40.0000 [IU] | Freq: Every day | SUBCUTANEOUS | Status: DC
  Filled 2014-06-17: qty 0.4

## 2014-06-17 NOTE — Evaluation (Signed)
Physical Therapy Evaluation Patient Details Name: Tamara Fry MRN: [id number] DOB: 02-22-1946 Today's Date: 06/17/2014   History of Present Illness    Patient with a history of diabetes, hypertension and migraine presents with a migraine type headache for the past day. She states it is global and gradual onset. Associated with photophobia and mild nausea. She's had lightheadedness throughout the day. It is worse with position change. Patient states that at 9 PM she began having room spinning sensation. This is associated with nausea and vomiting.she states she had tingling sensation in her bilateral hands and feet. She denied any focal weakness or numbness. This the symptoms have improved significantly.she states she only has a mild amount of room spinning sensation currently. She continues to have a generalized headache. She's had no fever or chills. She has no neck pain or stiffness. Patient does have a history of "mini strokes" in the past.   Clinical Impression  Pt admitted with episode of vertigo. Pt currently with functional limitations due to dizziness affecting mobility and balance. Pt will benefit from skilled PT to increase her independence and safety with mobility to allow discharge to home with home health. Pt had 2/10 dizziness prior to and during Epley's maneuver treatment. Pt had decreased dizziness after maneuver. Pt was given Brandt-Daroff handout if dizziness persists.     Follow Up Recommendations Home health PT    Equipment Recommendations  Cane    Recommendations for Other Services       Precautions / Restrictions Restrictions Weight Bearing Restrictions: No      Mobility  Bed Mobility Overal bed mobility: Independent             General bed mobility comments: Pt able to roll and perform supine to sit safely with supervision. No assistance given and patient able to maintain sitting EOB without assistance.    Transfers Overall transfer level:  Independent Equipment used: None             General transfer comment: Pt able to perform sit to stand/stand to sit safely without assistance.   Ambulation/Gait Ambulation/Gait assistance: Supervision Ambulation Distance (Feet): 180 Feet Assistive device: None Gait Pattern/deviations: Decreased stride length;Step-through pattern   Gait velocity interpretation: Below normal speed for age/gender General Gait Details: Pt had slight lean to Right side when ambulating but able to ambulate without safety concerns and self-correct.   Stairs            Wheelchair Mobility    Modified Rankin (Stroke Patients Only)       Balance                                             Pertinent Vitals/Pain Pain Assessment: No/denies pain    Home Living Family/patient expects to be discharged to:: Private residence Living Arrangements: Spouse/significant other Available Help at Discharge: Family (Son possibly ) Type of Home: House Home Access: [place]: One [place]: None Additional Comments: Pt lives with husband who is a paraplegic. Pt stated she is his caregiver. Pt also has a son living with her but cannot rely on him for assistance since he works.     Prior Function Level of Independence: Independent         Comments: Pt has woman who comes in to perform cleaning weekly, as well as assistance for  caring for her husband who comes 3 days a week. Pt stated she does not lift/transfer her husband when the nurse is not present.      Hand Dominance   Dominant Hand: Right    Extremity/Trunk Assessment   Upper Extremity Assessment: Defer to OT evaluation           Lower Extremity Assessment: Overall WFL for tasks assessed         Communication   Communication: No difficulties  Cognition Arousal/Alertness: Awake/alert Behavior During Therapy: WFL for tasks assessed/performed Overall Cognitive Status: Within  Functional Limits for tasks assessed                      General Comments General comments (skin integrity, edema, etc.): Pt had slight episode of BPPV that was tested and treated using the Epley maneuver. Pt had 2/10 dizziness prior to and during treatment procedure. Pt had decreased dizziness post-maneuver. Pt was given handout for exercise to perform if dizziness continues to persist.      Exercises        Assessment/Plan    PT Assessment Patient needs continued PT services  PT Diagnosis  (Dizziness)   PT Problem List Decreased balance;Decreased mobility  PT Treatment Interventions DME instruction;Gait training;Functional mobility training;Therapeutic activities;Balance training;Therapeutic exercise   PT Goals (Current goals can be found in the Care Plan section) Acute Rehab PT Goals Patient Stated Goal: Return to home and prior level of function PT Goal Formulation: With patient Time For Goal Achievement: 07/01/14 Potential to Achieve Goals: Good    Frequency Min 3X/week   Barriers to discharge        Co-evaluation               End of Session Equipment Utilized During Treatment: Gait belt Activity Tolerance: Patient tolerated treatment well Patient left: in chair;with call bell/phone within reach      Functional Assessment Tool Used: Clinical Judgement Functional Limitation: Mobility: Walking and moving around Mobility: Walking and Moving Around Current Status [phone]): At least 1 percent but less than 20 percent impaired, limited or restricted Mobility: Walking and Moving Around Goal Status [phone]): 0 percent impaired, limited or restricted    Time: 0941-1027 PT Time Calculation (min): 46 min   Charges:         PT G Codes:   Functional Assessment Tool Used: Clinical Judgement Functional Limitation: Mobility: Walking and moving around    [place] 06/17/2014, 11:38 AM

## 2014-06-17 NOTE — Plan of Care (Signed)
Problem: Phase II Progression Outcomes Goal: Progress activity as tolerated unless otherwise ordered Outcome: Completed/Met Date Met:  06/17/14 Goal: Discharge plan established Outcome: Completed/Met Date Met:  06/17/14 Goal: Vital signs remain stable Outcome: Completed/Met Date Met:  06/17/14 Goal: Obtain order to discontinue catheter if appropriate Outcome: Not Applicable Date Met:  18/40/37

## 2014-06-17 NOTE — Progress Notes (Signed)
Nutrition Brief Note  Patient identified on the Malnutrition Screening Tool (MST) Report  Wt Readings from Last 15 Encounters:  06/17/14 198 lb 6.6 oz (90 kg)  04/19/14 201 lb (91.173 kg)  03/18/14 205 lb (92.987 kg)  01/03/14 205 lb (92.987 kg)  07/27/13 209 lb (94.802 kg)  03/15/11 214 lb (97.07 kg)  04/08/10 213 lb (96.616 kg)   68 yo female with dm2, c/o vertigo x 1 day, worse last nite, Pt notes headache, and pain all over. + nausea/vomitting. Her MRI brain was neg for acute stroke.  Pt reports good appetite presently and PTA. She did well with her breakfast this morning. She reports that she has losing weight intentionally by watching what she eats and decreasing portion sizes.   Body mass index is 33.02 kg/(m^2). Patient meets criteria for obesity, class I based on current BMI.   Current diet order is Heart Healthy/ Carb Modifed, patient is consuming approximately 100% of meals at this time. Labs and medications reviewed.   No nutrition interventions warranted at this time. If nutrition issues arise, please consult RD.    Aarya Quebedeaux A. Jimmye Norman, RD, LDN Pager: [phone] After hours Pager: [phone]

## 2014-06-17 NOTE — Progress Notes (Signed)
UR completed 

## 2014-06-17 NOTE — Progress Notes (Signed)
Inpatient Diabetes Program Recommendations  AACE/ADA: New Consensus Statement on Inpatient Glycemic Control (2013)  Target Ranges:  Prepandial:   less than 140 mg/dL      Peak postprandial:   less than 180 mg/dL (1-2 hours)      Critically ill patients:  140 - 180 mg/dL     Results for LASHEKA, BOGDON (MRN [id number]) as of 06/17/2014 11:27  Ref. Range 06/16/2014 08:00 06/16/2014 12:56 06/16/2014 16:12 06/16/2014 21:37  Glucose-Capillary Latest Range: 70-99 mg/dL 181 (H) 213 (H) 222 (H) 265 (H)    Results for SAMONE, DISANDRO (MRN [id number]) as of 06/17/2014 11:27  Ref. Range 06/17/2014 08:05  Glucose-Capillary Latest Range: 70-99 mg/dL 55 (L)    Results for LAKODA, CREES (MRN [id number]) as of 06/17/2014 11:27  Ref. Range 06/16/2014 06:59  Hemoglobin A1C Latest Range: <5.7 % 9.4 (H)     Admitted with Vertigo.  History of DM, HTN.   Home DM Meds: Humalog 50/50 insulin- 30 units with breakfast and 90 units with supper   Current DM Meds: Lantus 50 units AM/ 40 units PM          Novolog Sensitive SSI   **Note patient had hypoglycemia this AM.  Lantus decreased to current doses as a result.  Agree.  **Patient may likely need less basal insulin (only gets total of 60 units long-acting insulin in her doses of 50/50 insulin per day at home)  [facility] with patient about her elevated A1c of 9.4%.  Patient stated she checks her CBGs bid at home and sees Dr. Wilson Singer with [facility] for primary and DM care.  Has appointment with Dr. Wilson Singer sometime in December.  Explained what an A1c is and what it measures.  Reminded patient that her goal A1c is 7% or less per ADA standards to prevent both acute and long-term complications.  Discussed with patient why we are managing her CBGs with Lantus and Novolog here in the hospital.  Patient states she was OK with that but wanted to make sure that we send her home on her Humalog 50/50 insulin.  I informed patient that she should continue her Humalog 50/50  insulin at home unless the MD here in the hospital changes her home insulin regimen.    Will follow Wyn Quaker RN, MSN, CDE Diabetes Coordinator Inpatient Diabetes Program Team Pager: [phone] (8a-10p)

## 2014-06-18 ENCOUNTER — Encounter: Payer: Medicare Other | Admitting: Cardiology

## 2014-06-19 NOTE — Progress Notes (Signed)
This encounter was created in error - please disregard.

## 2014-06-23 NOTE — Discharge Summary (Signed)
Physician Discharge Summary  Tamara Fry [id number] DOB: 1946/05/26 DOA: 06/16/2014  PCP: Dwan Bolt, MD  Admit date: 06/16/2014 Discharge date: 06/17/2014  Time spent: 30 minutes  Recommendations for Outpatient Follow-up:  1. Follow up with PCP in one week.   Discharge Diagnoses:  Principal Problem:   Vertigo Active Problems:   Diabetes   Hyperlipidemia   Hypertension   Discharge Condition: improved.   Diet recommendation: low sodium diet.   Filed Weights   06/16/14 0735 06/17/14 0619  Weight: 89.086 kg (196 lb 6.4 oz) 90 kg (198 lb 6.6 oz)    History of present illness/Hospital Course:  68 yo female with dm2, c/o vertigo x 1 day, worse last nite, Pt notes headache, and pain all over. + nausea/vomitting. Her MRI brain was neg for acute stroke. Her symptoms of nausea and vomiting has resolved with hydration and IV anti emand she did not have any more diarrhea. Echocardiogram ordered but patient didn ot want to get tested and wanted to leave. Outpatient cardiology appointment . She was foun dto be slightly hypoglycemic this am.  Procedures:  Echocardiogram.  Consultations:  none  Discharge Exam: Filed Vitals:   06/17/14 0619  BP: 176/63  Pulse: 56  Temp: 98.4 F (36.9 C)  Resp: 18    General: alert afebrile comfortable Cardiovascular: s1s2 Respiratory: ctab  Discharge Instructions You were cared for by a hospitalist during your hospital stay. If you have any questions about your discharge medications or the care you received while you were in the hospital after you are discharged, you can call the unit and asked to speak with the hospitalist on call if the hospitalist that took care of you is not available. Once you are discharged, your primary care physician will handle any further medical issues. Please note that NO REFILLS for any discharge medications will be authorized once you are discharged, as it is imperative that you return to your primary  care physician (or establish a relationship with a primary care physician if you do not have one) for your aftercare needs so that they can reassess your need for medications and monitor your lab values.  Discharge Instructions    Diet - low sodium heart healthy    Complete by:  As directed      Discharge instructions    Complete by:  As directed   Follow up with cardiologist tomorrow at the office at 8 am.          Discharge Medication List as of 06/17/2014  3:24 PM    START taking these medications   Details  hydrALAZINE (APRESOLINE) 10 MG tablet Take 1 tablet (10 mg total) by mouth every 8 (eight) hours., Starting 06/17/2014, Until Discontinued, Print      CONTINUE these medications which have CHANGED   Details  insulin lispro protamine-lispro (HUMALOG 50/50 MIX) (50-50) 100 UNIT/ML SUSP injection Inject 0.3-0.45 mLs (30-45 Units total) into the skin 2 (two) times daily before a meal. Inject 30 units every morning and 45 units every night, Starting 06/17/2014, Until Discontinued, Print      CONTINUE these medications which have NOT CHANGED   Details  aspirin 325 MG tablet Take 325 mg by mouth daily. , Until Discontinued, Historical Med    donepezil (ARICEPT) 10 MG tablet Take 1 tablet daily, Normal    LORazepam (ATIVAN) 0.5 MG tablet Take 0.5 mg by mouth 3 (three) times daily as needed for anxiety., Until Discontinued, Historical Med    mirabegron ER (MYRBETRIQ)  25 MG TB24 tablet Take 25 mg by mouth at bedtime., Until Discontinued, Historical Med    Pitavastatin Calcium (LIVALO) 2 MG TABS Take 2 mg by mouth at bedtime., Until Discontinued, Historical Med    traMADol (ULTRAM) 50 MG tablet Take 50 mg by mouth 2 (two) times daily as needed (headache). For pain. Maximum dose= 8 tablets per day, Until Discontinued, Historical Med    valsartan (DIOVAN) 160 MG tablet Take 160 mg by mouth daily.  , Until Discontinued, Historical Med    venlafaxine (EFFEXOR) 37.5 MG tablet Take 1 tablet  (37.5 mg total) by mouth at bedtime., Starting 04/26/2014, Until Discontinued, Phone In    nebivolol (BYSTOLIC) 10 MG tablet Take 10 mg by mouth daily., Until Discontinued, Historical Med      STOP taking these medications     nortriptyline (PAMELOR) 10 MG capsule      simvastatin (ZOCOR) 20 MG tablet        No Known Allergies Follow-up Information    Follow up with Dwan Bolt, MD. Schedule an appointment as soon as possible for a visit in 1 week.   Specialty:  Endocrinology   Contact information:   [address] [place] [address] [phone]       Follow up with Sueanne Margarita, MD On 06/18/2014.   Specialty:  Cardiology   Why:  AT 8 AM.    Contact information:   [id number] [place] [address] [phone]       Follow up with [facility].   Why:  HHPT   Contact information:   [place] [address] [phone]        The results of significant diagnostics from this hospitalization (including imaging, microbiology, ancillary and laboratory) are listed below for reference.    Significant Diagnostic Studies: Ct Head Wo Contrast  06/16/2014   CLINICAL DATA:  Vertigo and headache.  Migraines.  EXAM: CT head  COMPARISON:  MRI of the brain from 03/18/2014  FINDINGS: There is patchy low attenuation within the periventricular and subcortical white matter. Partial opacification of the left maxillary sinus noted. The remaining paranasal sinuses are clear. The mastoid air cells are clear. The skull is intact. There is prominence of the sulci and ventricles compatible with brain atrophy. There is no abnormal extra-axial fluid collections, intracranial hemorrhage or mass.  IMPRESSION: No acute intracranial abnormalities identified.  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   Electronically Signed   By: Kerby Moors M.D.   On: 06/16/2014 03:50   Mr Brain Wo Contrast  06/16/2014   CLINICAL  DATA:  Vertigo  EXAM: MRI HEAD WITHOUT CONTRAST  TECHNIQUE: Multiplanar, multiecho pulse sequences of the brain and surrounding structures were obtained without intravenous contrast.  COMPARISON:  CT 06/16/2014.  MRI 03/18/2014  FINDINGS: Mild atrophy unchanged.  Negative for hydrocephalus.  Negative for acute infarct.  Chronic microvascular ischemic change in the white matter and pons of a mild degree. 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 No acute hemorrhage or mass. No edema or shift of the midline structures.  Paranasal sinuses are clear.  IMPRESSION: No acute abnormality.  Mild chronic ischemic changes are stable.   Electronically Signed   By: Franchot Gallo M.D.   On: 06/16/2014 13:37   US Renal  06/16/2014   CLINICAL DATA:  Acute renal failure.  EXAM: RENAL/URINARY TRACT ULTRASOUND COMPLETE  COMPARISON:  CT 02/08/2013  FINDINGS: Right Kidney:  Length: 9.8 cm. There is cortical thinning and the kidney is mildly lobulated. No evidence for hydronephrosis or focal lesion.  Left Kidney:  Length: 10.0 cm. Mild cortical thinning in the left kidney without hydronephrosis.  Bladder:  Small amount of fluid in the urinary bladder. No gross abnormality to the urinary bladder. Evidence for bilateral ureter jets.  IMPRESSION: Negative for hydronephrosis.  Both kidneys demonstrate cortical thinning. Findings could be related to chronic medical renal disease.   Electronically Signed   By: Markus Daft M.D.   On: 06/16/2014 20:13    Microbiology: No results found for this or any previous visit (from the past 240 hour(s)).   Labs: Basic Metabolic Panel:  Recent Labs Lab 06/17/14 0540  NA 142  K 4.6  CL 107  CO2 22  GLUCOSE 61*  BUN 30*  CREATININE 1.22*  CALCIUM 8.6   Liver Function Tests: No results for input(s): AST, ALT, ALKPHOS, BILITOT, PROT, ALBUMIN in the last 168 hours. No results for input(s): LIPASE, AMYLASE in the last 168 hours. No results for input(s):  AMMONIA in the last 168 hours. CBC: No results for input(s): WBC, NEUTROABS, HGB, HCT, MCV, PLT in the last 168 hours. Cardiac Enzymes: No results for input(s): CKTOTAL, CKMB, CKMBINDEX, TROPONINI in the last 168 hours. BNP: BNP (last 3 results) No results for input(s): PROBNP in the last 8760 hours. CBG:  Recent Labs Lab 06/17/14 0805 06/17/14 1221  GLUCAP 55* 142*       Signed:  Tarena Gockley  [facility] 06/17/2014, 11:41 PM

## 2014-06-25 ENCOUNTER — Other Ambulatory Visit: Payer: Medicare Other | Admitting: Internal Medicine

## 2014-07-19 ENCOUNTER — Ambulatory Visit: Payer: Medicare Other | Admitting: Neurology

## 2014-07-19 ENCOUNTER — Telehealth: Payer: Self-pay | Admitting: Neurology

## 2014-07-19 NOTE — Telephone Encounter (Signed)
Pt son Eddie Dibbles states that he can not bring pt on 07-24-14 and will call back to resch

## 2014-07-23 ENCOUNTER — Encounter: Payer: Self-pay | Admitting: Cardiology

## 2014-07-24 ENCOUNTER — Ambulatory Visit: Payer: Medicare Other | Admitting: Neurology

## 2014-09-03 ENCOUNTER — Other Ambulatory Visit: Payer: Self-pay | Admitting: Neurology

## 2014-11-27 ENCOUNTER — Telehealth: Payer: Self-pay | Admitting: Neurology

## 2014-11-27 ENCOUNTER — Other Ambulatory Visit: Payer: Self-pay | Admitting: Family Medicine

## 2014-11-27 DIAGNOSIS — R413 Other amnesia: Secondary | ICD-10-CM

## 2014-11-27 MED ORDER — DONEPEZIL HCL 10 MG PO TABS: ORAL_TABLET | ORAL | Status: DC

## 2014-11-27 NOTE — Telephone Encounter (Signed)
Returned call. He was requesting refill on medication for patient. I did advise him that I needed clarified exactly which medication needed to be refilled because Dr. Delice Lesch prescribed 2 medications for patient. He states that he would verify this and call me back.

## 2014-11-27 NOTE — Telephone Encounter (Signed)
Pt son Eddie Dibbles called ans states that the patient needs a refill on the medication but could not give me the name of the medication. He would like it called into the [place] phone number is [phone]

## 2014-11-29 ENCOUNTER — Telehealth: Payer: Self-pay | Admitting: Neurology

## 2014-11-29 NOTE — Telephone Encounter (Signed)
Returned call. He wanted to follow-up on our recent conversation about the patient's medication. We did receive Rx for Aricept from [facility] and did approve the Rx with refills.

## 2014-11-29 NOTE — Telephone Encounter (Signed)
Eddie Dibbles, pt's son called stating that he was returning your call. He stated it was regarding pt's Rx. He did not have the name of the script with him. C/b [phone]

## 2014-12-11 DIAGNOSIS — N390 Urinary tract infection, site not specified: Secondary | ICD-10-CM | POA: Diagnosis not present

## 2014-12-13 ENCOUNTER — Encounter: Payer: Self-pay | Admitting: Neurology

## 2014-12-13 ENCOUNTER — Ambulatory Visit (INDEPENDENT_AMBULATORY_CARE_PROVIDER_SITE_OTHER): Payer: Medicare Other | Admitting: Neurology

## 2014-12-13 VITALS — BP 120/76 | HR 70 | Ht 63.0 in | Wt 208.9 lb

## 2014-12-13 DIAGNOSIS — G3184 Mild cognitive impairment, so stated: Secondary | ICD-10-CM

## 2014-12-13 DIAGNOSIS — H8110 Benign paroxysmal vertigo, unspecified ear: Secondary | ICD-10-CM | POA: Diagnosis not present

## 2014-12-13 DIAGNOSIS — G43009 Migraine without aura, not intractable, without status migrainosus: Secondary | ICD-10-CM | POA: Diagnosis not present

## 2014-12-13 NOTE — Patient Instructions (Signed)
1. Continue all your medications 2.Recommend DMV driver's assessment for driving privileges 3. Follow-up in 6 months

## 2014-12-13 NOTE — Progress Notes (Signed)
NEUROLOGY FOLLOW UP OFFICE NOTE  ADREONNA HINCK [id number]  HISTORY OF PRESENT ILLNESS: I had the pleasure of seeing Seena Dobbins in follow-up in the neurology clinic on 12/13/2014.  The patient was last seen 7 months ago for confusion and hallucinations. She is again accompanied by her son who helps supplement the history today.  Records and images were personally reviewed where available. Since her last visit, she was admitted for vertigo last 06/2014. I personally reviewed MRI brain done then which did not show any acute changes, there was chronic microvascular disease again seen. She reports having dizziness 1-2 times a week, very brief in duration. She has occasional migraines which are relieved in 1-2 hours with Tramadol. She reports they are not constant and that they are better, no associated nausea and vomiting. She had been prescribed nortriptyline for headache prophylaxis on her last visit, but this was not started due to concern for reaction with Myrbetriq. She was instead started on low dose Effexor XR 37.5mg . She was also started on Aricept for memory concerns, MMSE on her last visit was 25/30. Her son reports she had good and bad days. He has noticed that since starting medication, she does not report any visual hallucinations. He reports "everything has been good with the medication." They deny any missed bills, no missed medications, family helps her sometimes by putting medications on the table for her. No difficulties with ADLs. She is helping her husband who is currently in the hospital.   HPI: This is a 69 yo RH woman with a history of migraines since age 36, hypertension, dyslipidemia, diabetes, right putamen lacunar infarct in 07/2013 with no residual deficits, in her usual state of health until 03/17/14 while talking to her son on the phone, her son noted that she was "talking out of her head," telling him that there were people in the house and she didn't know what they were doing there.  She does not recall telling him this. Her son also reports that this has happened several times with his brother on the phone as well, but cannot given specific instances. She presented to the ER the next day due to a different type of headache, which she reports was the same as her stroke in 07/2013. She reports her migraines are usually over the right side, with throbbing pain lasting several hours, photophobia, occasional nausea and vomiting. She had some visual auras in the past but none recently. She usually takes Tramadol twice a week with good effect. She reports the different type of headache was on the left side, but stated it resolved by the time she got to the ER. She denies any dizziness, diplopia, dysarthria, dysphagia, back pain, bowel/bladder dysfunction. She has chronic neck pain. She had an MRI brain in the ER which I personally reviewed, no acute infarct seen. Remote lacunar infarct in putamen noted, there is generalized cerebral atrophy and mild chronic microvascular changes. Her son reports that she has not had any more hallucinations, but her memory loss has been more noticeable. 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 A CNA comes twice a week to help her bathe him. She reports that she does get confused sometimes at home, she can't find things, but denies missing any bill payments or getting lost driving. Family has instructed her to stop driving. She occasionally forgets to take her medications, then does remember to take it at bedtime.They deny any family history of memory  problems. She denies any other visual hallucinations. She reports sleep is poor, she usually goes to bed between 1-3am, because she helps her husband.  She had seen neurologist Dr. Leonie Man for the stroke, most recently 70 months ago. With her initial stroke, she had presented with headaches, blurred vision, and feeling off balance. Her MRI showed a small subacute infarct  in the right putamen, felt to be due to small vessel disease. She continues on daily aspirin.   She recall trying Topamax and Darvocet for her headaches in the past, which worsened the pain.  PAST MEDICAL HISTORY: Past Medical History  Diagnosis Date  . Diabetes mellitus   . Hypertension   . Migraine   . Hyperlipidemia     MEDICATIONS: Current Outpatient Prescriptions on File Prior to Visit  Medication Sig Dispense Refill  . aspirin 325 MG tablet Take 325 mg by mouth daily.     Marland Kitchen donepezil (ARICEPT) 10 MG tablet Take 1 tablet daily 30 tablet 4  . insulin lispro protamine-lispro (HUMALOG 50/50 MIX) (50-50) 100 UNIT/ML SUSP injection Inject 0.3-0.45 mLs (30-45 Units total) into the skin 2 (two) times daily before a meal. Inject 30 units every morning and 45 units every night 10 mL 1  . LORazepam (ATIVAN) 0.5 MG tablet Take 0.5 mg by mouth 3 (three) times daily as needed for anxiety.    . mirabegron ER (MYRBETRIQ) 25 MG TB24 tablet Take 25 mg by mouth at bedtime.    . nebivolol (BYSTOLIC) 10 MG tablet Take 0.5 tablets (5 mg total) by mouth daily. 30 tablet 0  . Pitavastatin Calcium (LIVALO) 2 MG TABS Take 2 mg by mouth at bedtime.    . traMADol (ULTRAM) 50 MG tablet Take 50 mg by mouth 2 (two) times daily as needed (headache). For pain. Maximum dose= 8 tablets per day    . valsartan (DIOVAN) 160 MG tablet Take 160 mg by mouth daily.      Marland Kitchen venlafaxine (EFFEXOR) 37.5 MG tablet Take 1 tablet (37.5 mg total) by mouth at bedtime. 30 tablet 3  . venlafaxine XR (EFFEXOR-XR) 37.5 MG 24 hr capsule TAKE 1 CAPSULE AT BEDTIME. 30 capsule 4   No current facility-administered medications on file prior to visit.    ALLERGIES: No Known Allergies  FAMILY HISTORY: Family History  Problem Relation Age of Onset  . Hypertension Mother   . Diabetes Mellitus II Father   . Prostate cancer Father     SOCIAL HISTORY: History   Social History  . Marital Status: Married    Spouse Name: N/A  .  Number of Children: N/A  . Years of Education: N/A   Occupational History  . Not on file.   Social History Main Topics  . Smoking status: Former Smoker -- 1.00 packs/day for 5 years    Types: Cigarettes  . Smokeless tobacco: Not on file  . Alcohol Use: No  . Drug Use: No  . Sexual Activity: No   Other Topics Concern  . Not on file   Social History Narrative    REVIEW OF SYSTEMS: Constitutional: No fevers, chills, or sweats, no generalized fatigue, change in appetite Eyes: No visual changes, double vision, eye pain Ear, nose and throat: No hearing loss, ear pain, nasal congestion, sore throat Cardiovascular: No chest pain, palpitations Respiratory:  No shortness of breath at rest or with exertion, wheezes GastrointestinaI: No nausea, vomiting, diarrhea, abdominal pain, fecal incontinence Genitourinary:  No dysuria, urinary retention or frequency Musculoskeletal:  +chronic neck pain,  no back pain Integumentary: No rash, pruritus, skin lesions Neurological: as above Psychiatric: No depression, insomnia, anxiety Endocrine: No palpitations, fatigue, diaphoresis, mood swings, change in appetite, change in weight, increased thirst Hematologic/Lymphatic:  No anemia, purpura, petechiae. Allergic/Immunologic: no itchy/runny eyes, nasal congestion, recent allergic reactions, rashes  PHYSICAL EXAM: Filed Vitals:   12/13/14 0829  BP: 120/76  Pulse: 70   General: No acute distress Head:  Normocephalic/atraumatic Neck: supple, no paraspinal tenderness, full range of motion Heart:  Regular rate and rhythm Lungs:  Clear to auscultation bilaterally Back: No paraspinal tenderness Skin/Extremities: No rash, no edema Neurological Exam: alert and oriented to person, place, and time. No aphasia or dysarthria. Fund of knowledge is appropriate.  Recent and remote memory are intact.  Attention and concentration are normal.    Able to name objects and repeat phrases.  MMSE - Mini Mental State  Exam 12/13/2014 04/20/2014  Orientation to time 4 4  Orientation to Place 5 5  Registration 3 3  Attention/ Calculation 4 5  Recall 1 0  Language- name 2 objects 2 2  Language- repeat 1 1  Language- follow 3 step command 3 3  Language- read & follow direction 1 1  Write a sentence 1 1  Copy design 0 0  Total score 25 25   Cranial nerves: Pupils equal, round, reactive to light.  Fundoscopic exam unremarkable, no papilledema. Extraocular movements intact with no nystagmus. Visual fields full. Facial sensation intact. No facial asymmetry. Tongue, uvula, palate midline.  Motor: Bulk and tone normal, muscle strength 5/5 throughout with no pronator drift.  Sensation to light touch intact.  No extinction to double simultaneous stimulation.  Deep tendon reflexes 1+ throughout except for absent ankle jerks bilaterally, toes downgoing.  Finger to nose testing intact.  Gait narrow-based and steady, able to tandem walk adequately.  Romberg negative.  IMPRESSION: This is a 69 yo RH woman with hypertension, hyperlipidemia, diabetes, lacunar infarct in right putamen 07/2013 with no residual deficits, episodic migraines, who presented with a different type of headache and episodes of confusion/visual hallucinations per son. MRI brain did not show any acute changes. She reports headaches are well-controlled. She was started on Effexor for headache prophylaxis, continue current dose for now. She has been admitted for vertigo, she reports this is tolerable as well, suggestive of benign positional vertigo. The visual hallucinations have quieted down, her son reports that he has noticed this is when she takes the Aricept, continue current dose. MMSE today is unchanged 25/30, indicating mild cognitive impairment. Neurological exam non-focal. We discussed driving, with [employer], a driving assessment is recommended, particularly if family has noticed any concerns. She will follow-up in 6 months.   Thank you for allowing me to  participate in her care.  Please do not hesitate to call for any questions or concerns.  The duration of this appointment visit was 15 minutes of face-to-face time with the patient.  Greater than 50% of this time was spent in counseling, explanation of diagnosis, planning of further management, and coordination of care.   Ellouise Newer, M.D.   CC: Dr. Wilson Singer

## 2014-12-18 ENCOUNTER — Encounter: Payer: Self-pay | Admitting: Neurology

## 2014-12-18 DIAGNOSIS — G43009 Migraine without aura, not intractable, without status migrainosus: Secondary | ICD-10-CM | POA: Insufficient documentation

## 2014-12-18 DIAGNOSIS — R4189 Other symptoms and signs involving cognitive functions and awareness: Secondary | ICD-10-CM | POA: Insufficient documentation

## 2014-12-18 DIAGNOSIS — H8110 Benign paroxysmal vertigo, unspecified ear: Secondary | ICD-10-CM | POA: Insufficient documentation

## 2014-12-18 DIAGNOSIS — G3184 Mild cognitive impairment, so stated: Principal | ICD-10-CM

## 2015-02-21 DIAGNOSIS — E789 Disorder of lipoprotein metabolism, unspecified: Secondary | ICD-10-CM | POA: Diagnosis not present

## 2015-02-21 DIAGNOSIS — E118 Type 2 diabetes mellitus with unspecified complications: Secondary | ICD-10-CM | POA: Diagnosis not present

## 2015-02-21 DIAGNOSIS — I10 Essential (primary) hypertension: Secondary | ICD-10-CM | POA: Diagnosis not present

## 2015-05-13 ENCOUNTER — Other Ambulatory Visit: Payer: Self-pay | Admitting: Neurology

## 2015-05-23 DIAGNOSIS — N390 Urinary tract infection, site not specified: Secondary | ICD-10-CM | POA: Diagnosis not present

## 2015-05-23 DIAGNOSIS — E118 Type 2 diabetes mellitus with unspecified complications: Secondary | ICD-10-CM | POA: Diagnosis not present

## 2015-05-23 DIAGNOSIS — E789 Disorder of lipoprotein metabolism, unspecified: Secondary | ICD-10-CM | POA: Diagnosis not present

## 2015-06-20 ENCOUNTER — Encounter: Payer: Self-pay | Admitting: Neurology

## 2015-06-20 ENCOUNTER — Ambulatory Visit (INDEPENDENT_AMBULATORY_CARE_PROVIDER_SITE_OTHER): Payer: Medicare Other | Admitting: Neurology

## 2015-06-20 VITALS — BP 130/88 | HR 60 | Ht 65.0 in | Wt 218.0 lb

## 2015-06-20 DIAGNOSIS — I10 Essential (primary) hypertension: Secondary | ICD-10-CM | POA: Diagnosis not present

## 2015-06-20 DIAGNOSIS — G3184 Mild cognitive impairment, so stated: Secondary | ICD-10-CM | POA: Diagnosis not present

## 2015-06-20 DIAGNOSIS — G43009 Migraine without aura, not intractable, without status migrainosus: Secondary | ICD-10-CM | POA: Diagnosis not present

## 2015-06-20 DIAGNOSIS — E118 Type 2 diabetes mellitus with unspecified complications: Secondary | ICD-10-CM | POA: Diagnosis not present

## 2015-06-20 DIAGNOSIS — E785 Hyperlipidemia, unspecified: Secondary | ICD-10-CM

## 2015-06-20 DIAGNOSIS — M25529 Pain in unspecified elbow: Secondary | ICD-10-CM | POA: Diagnosis not present

## 2015-06-20 DIAGNOSIS — M25522 Pain in left elbow: Secondary | ICD-10-CM | POA: Diagnosis not present

## 2015-06-20 DIAGNOSIS — E119 Type 2 diabetes mellitus without complications: Secondary | ICD-10-CM

## 2015-06-20 DIAGNOSIS — Z23 Encounter for immunization: Secondary | ICD-10-CM | POA: Diagnosis not present

## 2015-06-20 DIAGNOSIS — Z794 Long term (current) use of insulin: Secondary | ICD-10-CM

## 2015-06-20 MED ORDER — VENLAFAXINE HCL 37.5 MG PO TABS: ORAL_TABLET | ORAL | Status: DC

## 2015-06-20 MED ORDER — DONEPEZIL HCL 10 MG PO TABS: 10.0000 mg | ORAL_TABLET | Freq: Every day | ORAL | Status: DC

## 2015-06-20 NOTE — Patient Instructions (Signed)
1. Increase Venlafaxine 37.5mg : Take 2 tablets at night 2. Continue Donepezil 10mg : Take 1 tablet daily 3. Physical exercise and brain stimulation exercises are important for brain health 4. Discuss getting out of house more and having time for yourself (getting more help at home) with your family 5. Discuss sleep problems with your PCP 6. Follow-up in 6 months

## 2015-06-20 NOTE — Progress Notes (Signed)
NEUROLOGY FOLLOW UP OFFICE NOTE  CALE MORAITIS [id number]  HISTORY OF PRESENT ILLNESS: I had the pleasure of seeing Tamara Fry in follow-up in the neurology clinic on 06/20/2015.  The patient was last seen 7 months ago for confusion and hallucinations. She is by herself today. MMSE in April 2016 was 25/30. She states that short-term memory is the same, she occasionally has difficulty answering questions when family asks her things. She does not drive and expressed concern that her family "does not let me go anywhere." She states she is at home all the time taking care of her husband 24/7. She is in charge of his medications, cleaning the house, paying bills. She denies any missed bill payments or missed medications. She denies any visual hallucinations. She reports migraines 1-2 times a week, with good response to Tramadol. She has a history of insomnia and has been having a harder time sleeping, with just 2 hours last night. She was in the hospital recently with her husband who has 2 aneurysm (groin and ?chest), and reports stress with this.    HPI: This is a 69 yo RH woman with a history of migraines since age 69, hypertension, dyslipidemia, diabetes, right putamen lacunar infarct in 07/2013 with no residual deficits, in her usual state of health until 03/17/14 while talking to her son on the phone, her son noted that she was "talking out of her head," telling him that there were people in the house and she didn't know what they were doing there. She does not recall telling him this. Her son also reports that this has happened several times with his brother on the phone as well, but cannot given specific instances. She presented to the ER the next day due to a different type of headache, which she reports was the same as her stroke in 07/2013. She reports her migraines are usually over the right side, with throbbing pain lasting several hours, photophobia, occasional nausea and vomiting. She had some visual  auras in the past but none recently. She usually takes Tramadol twice a week with good effect. She reports the different type of headache was on the left side, but stated it resolved by the time she got to the ER. She denies any dizziness, diplopia, dysarthria, dysphagia, back pain, bowel/bladder dysfunction. She has chronic neck pain. She had an MRI brain in the ER which I personally reviewed, no acute infarct seen. Remote lacunar infarct in putamen noted, there is generalized cerebral atrophy and mild chronic microvascular changes. Her son reports that she has not had any more hallucinations, but her memory loss has been more noticeable. 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 A CNA comes twice a week to help her bathe him. She reports that she does get confused sometimes at home, she can't find things, but denies missing any bill payments or getting lost driving. Family has instructed her to stop driving. She occasionally forgets to take her medications, then does remember to take it at bedtime.They deny any family history of memory problems. She denies any other visual hallucinations. She reports sleep is poor, she usually goes to bed between 1-3am, because she helps her husband.  She had seen neurologist Dr. Leonie Man for the stroke, most recently 4 months ago. With her initial stroke, she had presented with headaches, blurred vision, and feeling off balance. Her MRI showed a small subacute infarct in the right putamen, felt to be due to  small vessel disease. She continues on daily aspirin.   She recall trying Topamax and Darvocet for her headaches in the past, which worsened the pain.  Diagnostic Data: MRI brain 06/2014 did not show any acute changes, there was chronic microvascular disease again seen.  PAST MEDICAL HISTORY: Past Medical History  Diagnosis Date  . Diabetes mellitus   . Hypertension   . Migraine   . Hyperlipidemia      MEDICATIONS: Current Outpatient Prescriptions on File Prior to Visit  Medication Sig Dispense Refill  . aspirin 325 MG tablet Take 325 mg by mouth daily.     Marland Kitchen donepezil (ARICEPT) 10 MG tablet TAKE 1 TABLET ONCE DAILY. 30 tablet 5  . insulin lispro protamine-lispro (HUMALOG 50/50 MIX) (50-50) 100 UNIT/ML SUSP injection Inject 0.3-0.45 mLs (30-45 Units total) into the skin 2 (two) times daily before a meal. Inject 30 units every morning and 45 units every night 10 mL 1  . LORazepam (ATIVAN) 0.5 MG tablet Take 0.5 mg by mouth 3 (three) times daily as needed for anxiety.    . mirabegron ER (MYRBETRIQ) 25 MG TB24 tablet Take 25 mg by mouth at bedtime.    . nebivolol (BYSTOLIC) 10 MG tablet Take 0.5 tablets (5 mg total) by mouth daily. 30 tablet 0  . Pitavastatin Calcium (LIVALO) 2 MG TABS Take 2 mg by mouth at bedtime.    . traMADol (ULTRAM) 50 MG tablet Take 50 mg by mouth 2 (two) times daily as needed (headache). For pain. Maximum dose= 8 tablets per day    . valsartan (DIOVAN) 160 MG tablet Take 160 mg by mouth daily.      Marland Kitchen venlafaxine (EFFEXOR) 37.5 MG tablet Take 1 tablet (37.5 mg total) by mouth at bedtime. 30 tablet 3   No current facility-administered medications on file prior to visit.    ALLERGIES: No Known Allergies  FAMILY HISTORY: Family History  Problem Relation Age of Onset  . Hypertension Mother   . Diabetes Mellitus II Father   . Prostate cancer Father     SOCIAL HISTORY: Social History   Social History  . Marital Status: Married    Spouse Name: N/A  . Number of Children: N/A  . Years of Education: N/A   Occupational History  . Not on file.   Social History Main Topics  . Smoking status: Former Smoker -- 1.00 packs/day for 5 years    Types: Cigarettes  . Smokeless tobacco: Not on file  . Alcohol Use: No  . Drug Use: No  . Sexual Activity: No   Other Topics Concern  . Not on file   Social History Narrative    REVIEW OF SYSTEMS: Constitutional:  No fevers, chills, or sweats, no generalized fatigue, change in appetite Eyes: No visual changes, double vision, eye pain Ear, nose and throat: No hearing loss, ear pain, nasal congestion, sore throat Cardiovascular: No chest pain, palpitations Respiratory:  No shortness of breath at rest or with exertion, wheezes GastrointestinaI: No nausea, vomiting, diarrhea, abdominal pain, fecal incontinence Genitourinary:  No dysuria, urinary retention or frequency Musculoskeletal:  +chronic neck pain, no back pain Integumentary: No rash, pruritus, skin lesions Neurological: as above Psychiatric: No depression, insomnia, anxiety Endocrine: No palpitations, fatigue, diaphoresis, mood swings, change in appetite, change in weight, increased thirst Hematologic/Lymphatic:  No anemia, purpura, petechiae. Allergic/Immunologic: no itchy/runny eyes, nasal congestion, recent allergic reactions, rashes  PHYSICAL EXAM: Filed Vitals:   06/20/15 0804  BP: 130/88  Pulse: 60   General: No  acute distress Head:  Normocephalic/atraumatic Neck: supple, no paraspinal tenderness, full range of motion Heart:  Regular rate and rhythm Lungs:  Clear to auscultation bilaterally Back: No paraspinal tenderness Skin/Extremities: No rash, no edema Neurological Exam: alert and oriented to person, place, and time. No aphasia or dysarthria. Fund of knowledge is appropriate.  Recent and remote memory are intact.  Attention and concentration are normal.    Able to name objects and repeat phrases. Clock drawing test 4/5 MMSE - Mini Mental State Exam 06/20/2015 12/13/2014 04/20/2014  Orientation to time 4 4 4   Orientation to Place 5 5 5   Registration 3 3 3   Attention/ Calculation 4 4 5   Recall 2 1 0  Language- name 2 objects 2 2 2   Language- repeat 1 1 1   Language- follow 3 step command 3 3 3   Language- read & follow direction 1 1 1   Write a sentence 1 1 1   Copy design 1 0 0  Total score 27 25 25    Cranial nerves: Pupils  equal, round, reactive to light.  Fundoscopic exam unremarkable, no papilledema. Extraocular movements intact with no nystagmus. Visual fields full. Facial sensation intact. No facial asymmetry. Tongue, uvula, palate midline.  Motor: Bulk and tone normal, muscle strength 5/5 throughout with no pronator drift.  Sensation to light touch intact.  No extinction to double simultaneous stimulation.  Deep tendon reflexes 1+ throughout except for absent ankle jerks bilaterally, toes downgoing.  Finger to nose testing intact.  Gait narrow-based and steady, able to tandem walk adequately.  Romberg negative.  IMPRESSION: This is a 69 yo RH woman with hypertension, hyperlipidemia, diabetes, lacunar infarct in right putamen 07/2013 with no residual deficits, episodic migraines, who presented with a different type of headache and episodes of confusion/visual hallucinations per son. MRI brain did not show any acute changes. No further visual hallucinations since starting Aricept. Her MMSE today is 27/30 (25/30 in April 2016). Continue Aricept 10mg  daily. She reports migraines 1-2 times a week, and will increase Effexor 37.5mg  to 2 tabs qhs for headache prophylaxis. She expressed frustration with family not letting her drive or even go out, we discussed communicating her concerns with family and getting more help at home (she is primary caregiver for husband). She will discuss sleep issues with her PCP, we discussed the importance of good sleep hygiene in patients with headaches. She also expressed problems with stress with her home situation, and asked for medication for this. We discussed that increased Effexor dose may also be helpful for anxiety. We discussed the importance of control of vascular risk factors, physical exercise, and brain stimulation exercises for brain health. She will follow-up in 6 months and knows to call us for any problems.  Thank you for allowing me to participate in her care.  Please do not  hesitate to call for any questions or concerns.  The duration of this appointment visit was 25 minutes of face-to-face time with the patient.  Greater than 50% of this time was spent in counseling, explanation of diagnosis, planning of further management, and coordination of care.   Ellouise Newer, M.D.   CC: Dr. Wilson Singer

## 2015-10-13 ENCOUNTER — Ambulatory Visit (INDEPENDENT_AMBULATORY_CARE_PROVIDER_SITE_OTHER): Payer: 59 | Admitting: Podiatry

## 2015-10-13 ENCOUNTER — Encounter: Payer: Self-pay | Admitting: Podiatry

## 2015-10-13 DIAGNOSIS — M216X2 Other acquired deformities of left foot: Secondary | ICD-10-CM | POA: Diagnosis not present

## 2015-10-13 DIAGNOSIS — L84 Corns and callosities: Secondary | ICD-10-CM

## 2015-10-13 NOTE — Progress Notes (Signed)
Subjective:     Patient ID: Tamara Fry, female   DOB: 1946-01-08, 70 y.o.   MRN: [id number]  HPI 70 year old female presents the office today for a callus on the ball of her left foot was spent ongoing for greater than 1 year. She said there is painful triggering weightbearing pressure. Denies any redness or drainage. No other complaints. No previous treatment.  Review of Systems  All other systems reviewed and are negative.      Objective:   Physical Exam General: AAO x3, NAD  Dermatological: Hyperkeratotic lesion left foot submetatarsal 2. Upon debridement no underlying ulceration, drainage or other signs of infection. No other open lesions or pre-ulcerative lesions.  Vascular: Dorsalis Pedis artery and Posterior Tibial artery pedal pulses are 2/4 bilateral with immedate capillary fill time. Pedal hair growth present. No varicosities and no lower extremity edema present bilateral. There is no pain with calf compression, swelling, warmth, erythema.   Neruologic: Grossly intact via light touch bilateral. Vibratory intact via tuning fork bilateral. Protective threshold with Semmes Wienstein monofilament intact to all pedal sites bilateral. Patellar and Achilles deep tendon reflexes 2+ bilateral. No Babinski or clonus noted bilateral.   Musculoskeletal: Hammertoes are present. There is probably submetatarsal heads atrophy of fat pad. No pain, crepitus, or limitation noted with foot and ankle range of motion bilateral. Muscular strength 5/5 in all groups tested bilateral.  Gait: Unassisted, Nonantalgic.      Assessment:     70 year old female with submetatarsal 2 hyperkeratotic lesion    Plan:     -Treatment options discussed including all alternatives, risks, and complications -Etiology of symptoms were discussed -Hyperkeratotic lesion debrided without competitions of bleeding. Offloading has her dispense. Discussed recurrence. Follow-up as needed. Discussed shoe gear modifications  and offloading/orthotics.   Celesta Gentile, DPM

## 2015-11-07 ENCOUNTER — Encounter: Payer: Self-pay | Admitting: Neurology

## 2015-12-10 ENCOUNTER — Encounter (HOSPITAL_COMMUNITY): Payer: Self-pay | Admitting: *Deleted

## 2015-12-10 ENCOUNTER — Emergency Department (HOSPITAL_COMMUNITY): Payer: Medicare Other

## 2015-12-10 ENCOUNTER — Emergency Department (HOSPITAL_COMMUNITY)
Admission: EM | Admit: 2015-12-10 | Discharge: 2015-12-10 | Disposition: A | Discharge status: Home / Self Care | Payer: Medicare Other | Attending: Emergency Medicine | Admitting: Emergency Medicine

## 2015-12-10 DIAGNOSIS — I10 Essential (primary) hypertension: Secondary | ICD-10-CM | POA: Insufficient documentation

## 2015-12-10 DIAGNOSIS — E119 Type 2 diabetes mellitus without complications: Secondary | ICD-10-CM | POA: Diagnosis not present

## 2015-12-10 DIAGNOSIS — R059 Cough, unspecified: Secondary | ICD-10-CM

## 2015-12-10 DIAGNOSIS — Z79899 Other long term (current) drug therapy: Secondary | ICD-10-CM | POA: Insufficient documentation

## 2015-12-10 DIAGNOSIS — Z87891 Personal history of nicotine dependence: Secondary | ICD-10-CM | POA: Insufficient documentation

## 2015-12-10 DIAGNOSIS — G43909 Migraine, unspecified, not intractable, without status migrainosus: Secondary | ICD-10-CM | POA: Diagnosis not present

## 2015-12-10 DIAGNOSIS — N179 Acute kidney failure, unspecified: Secondary | ICD-10-CM | POA: Diagnosis not present

## 2015-12-10 DIAGNOSIS — Z7982 Long term (current) use of aspirin: Secondary | ICD-10-CM | POA: Diagnosis not present

## 2015-12-10 DIAGNOSIS — R079 Chest pain, unspecified: Secondary | ICD-10-CM | POA: Insufficient documentation

## 2015-12-10 DIAGNOSIS — Z794 Long term (current) use of insulin: Secondary | ICD-10-CM | POA: Diagnosis not present

## 2015-12-10 DIAGNOSIS — R05 Cough: Secondary | ICD-10-CM

## 2015-12-10 DIAGNOSIS — R0602 Shortness of breath: Secondary | ICD-10-CM | POA: Diagnosis not present

## 2015-12-10 LAB — BASIC METABOLIC PANEL
Anion gap: 10 (ref 5–15)
BUN: 52 mg/dL — AB (ref 6–20)
CALCIUM: 9.2 mg/dL (ref 8.9–10.3)
CO2: 24 mmol/L (ref 22–32)
CREATININE: 2.24 mg/dL — AB (ref 0.44–1.00)
Chloride: 104 mmol/L (ref 101–111)
GFR calc Af Amer: 24 mL/min — ABNORMAL LOW (ref >60)
GFR, EST NON AFRICAN AMERICAN: 21 mL/min — AB (ref >60)
Glucose, Bld: 279 mg/dL — ABNORMAL HIGH (ref 65–99)
Potassium: 5.1 mmol/L (ref 3.5–5.1)
SODIUM: 138 mmol/L (ref 135–145)

## 2015-12-10 LAB — CBC
HCT: 36.4 % (ref 36.0–46.0)
HEMOGLOBIN: 11.7 g/dL — AB (ref 12.0–15.0)
MCH: 28.9 pg (ref 26.0–34.0)
MCHC: 32.1 g/dL (ref 30.0–36.0)
MCV: 89.9 fL (ref 78.0–100.0)
PLATELETS: 217 10*3/uL (ref 150–400)
RBC: 4.05 MIL/uL (ref 3.87–5.11)
RDW: 12.8 % (ref 11.5–15.5)
WBC: 7.9 10*3/uL (ref 4.0–10.5)

## 2015-12-10 LAB — I-STAT TROPONIN, ED
TROPONIN I, POC: 0 ng/mL (ref 0.00–0.08)
Troponin i, poc: 0 ng/mL (ref 0.00–0.08)

## 2015-12-10 MED ORDER — BENZONATATE 100 MG PO CAPS: 100.0000 mg | ORAL_CAPSULE | Freq: Two times a day (BID) | ORAL | Status: DC | PRN

## 2015-12-10 NOTE — Discharge Instructions (Signed)
The Cardiology clinic will contact you for a follow up appointment.  Your kidney function is worse when compared to November 2015, this will need to be followed up by your family doctor for further evaluation.  Get rechecked immediately if you develop any new or worrisome symptoms.     Acute Kidney Injury Acute kidney injury is any condition in which there is sudden (acute) damage to the kidneys. Acute kidney injury was previously known as acute kidney failure or acute renal failure. The kidneys are two organs that lie on either side of the spine between the middle of the back and the front of the abdomen. The kidneys:  Remove wastes and extra water from the blood.   Produce important hormones. These help keep bones strong, regulate blood pressure, and help create red blood cells.   Balance the fluids and chemicals in the blood and tissues. A small amount of kidney damage may not cause problems, but a large amount of damage may make it difficult or impossible for the kidneys to work the way they should. Acute kidney injury may develop into long-lasting (chronic) kidney disease. It may also develop into a life-threatening disease called end-stage kidney disease. Acute kidney injury can get worse very quickly, so it should be treated right away. Early treatment may prevent other kidney diseases from developing. CAUSES   A problem with blood flow to the kidneys. This may be caused by:   Blood loss.   Heart disease.   Severe burns.   Liver disease.  Direct damage to the kidneys. This may be caused by:  Some medicines.   A kidney infection.   Poisoning or consuming toxic substances.   A surgical wound.   A blow to the kidney area.   A problem with urine flow. This may be caused by:   Cancer.   Kidney stones.   An enlarged prostate. SIGNS AND SYMPTOMS   Swelling (edema) of the legs, ankles, or feet.   Tiredness (lethargy).   Nausea or vomiting.    Confusion.   Problems with urination, such as:   Painful or burning feeling during urination.   Decreased urine production.   Frequent accidents in children who are potty trained.   Bloody urine.   Muscle twitches and cramps.   Shortness of breath.   Seizures.   Chest pain or pressure. Sometimes, no symptoms are present. DIAGNOSIS Acute kidney injury may be detected and diagnosed by tests, including blood, urine, imaging, or kidney biopsy tests.  TREATMENT Treatment of acute kidney injury varies depending on the cause and severity of the kidney damage. In mild cases, no treatment may be needed. The kidneys may heal on their own. If acute kidney injury is more severe, your health care provider will treat the cause of the kidney damage, help the kidneys heal, and prevent complications from occurring. Severe cases may require a procedure to remove toxic wastes from the body (dialysis) or surgery to repair kidney damage. Surgery may involve:   Repair of a torn kidney.   Removal of an obstruction. HOME CARE INSTRUCTIONS  Follow your prescribed diet.  Take medicines only as directed by your health care provider.  Do not take any new medicines (prescription, over-the-counter, or nutritional supplements) unless approved by your health care provider. Many medicines can worsen your kidney damage or may need to have the dose adjusted.   Keep all follow-up visits as directed by your health care provider. This is important.  Observe your condition to make  sure you are healing as expected. SEEK IMMEDIATE MEDICAL CARE IF:  You are feeling ill or have severe pain in the back or side.   Your symptoms return or you have new symptoms.  You have any symptoms of end-stage kidney disease. These include:   Persistent itchiness.   Loss of appetite.   Headaches.   Abnormally dark or light skin.  Numbness in the hands or feet.   Easy bruising.   Frequent  hiccups.   Menstruation stops.   You have a fever.  You have increased urine production.  You have pain or bleeding when urinating. MAKE SURE YOU:   Understand these instructions.  Will watch your condition.  Will get help right away if you are not doing well or get worse.   This information is not intended to replace advice given to you by your health care provider. Make sure you discuss any questions you have with your health care provider.   Document Released: 02/15/2011 Document Revised: 08/23/2014 Document Reviewed: 03/31/2012 Elsevier Interactive Patient Education 2016 Elsevier Inc.  Cough, Adult Coughing is a reflex that clears your throat and your airways. Coughing helps to heal and protect your lungs. It is normal to cough occasionally, but a cough that happens with other symptoms or lasts a long time may be a sign of a condition that needs treatment. A cough may last only 2-3 weeks (acute), or it may last longer than 8 weeks (chronic). CAUSES Coughing is commonly caused by:  Breathing in substances that irritate your lungs.  A viral or bacterial respiratory infection.  Allergies.  Asthma.  Postnasal drip.  Smoking.  Acid backing up from the stomach into the esophagus (gastroesophageal reflux).  Certain medicines.  Chronic lung problems, including COPD (or rarely, lung cancer).  Other medical conditions such as heart failure. HOME CARE INSTRUCTIONS  Pay attention to any changes in your symptoms. Take these actions to help with your discomfort:  Take medicines only as told by your health care provider.  If you were prescribed an antibiotic medicine, take it as told by your health care provider. Do not stop taking the antibiotic even if you start to feel better.  Talk with your health care provider before you take a cough suppressant medicine.  Drink enough fluid to keep your urine clear or pale yellow.  If the air is dry, use a cold steam vaporizer  or humidifier in your bedroom or your home to help loosen secretions.  Avoid anything that causes you to cough at work or at home.  If your cough is worse at night, try sleeping in a semi-upright position.  Avoid cigarette smoke. If you smoke, quit smoking. If you need help quitting, ask your health care provider.  Avoid caffeine.  Avoid alcohol.  Rest as needed. SEEK MEDICAL CARE IF:   You have new symptoms.  You cough up pus.  Your cough does not get better after 2-3 weeks, or your cough gets worse.  You cannot control your cough with suppressant medicines and you are losing sleep.  You develop pain that is getting worse or pain that is not controlled with pain medicines.  You have a fever.  You have unexplained weight loss.  You have night sweats. SEEK IMMEDIATE MEDICAL CARE IF:  You cough up blood.  You have difficulty breathing.  Your heartbeat is very fast.   This information is not intended to replace advice given to you by your health care provider. Make sure you discuss  any questions you have with your health care provider.   Document Released: 01/29/2011 Document Revised: 04/23/2015 Document Reviewed: 10/09/2014 Elsevier Interactive Patient Education [employer].

## 2015-12-10 NOTE — ED Notes (Signed)
NAD at this time. Pt is leaving with family.

## 2015-12-10 NOTE — ED Provider Notes (Signed)
CSN: [id number]     Arrival date & time 12/10/15  1208 History   First MD Initiated Contact with Patient 12/10/15 1603     Chief Complaint  Patient presents with  . Shortness of Breath  . Cough  . Chest Pain     Patient is a 70 y.o. female presenting with shortness of breath, cough, and chest pain. The history is provided by the patient. No language interpreter was used.  Shortness of Breath Associated symptoms: chest pain and cough   Cough Associated symptoms: chest pain and shortness of breath   Chest Pain Associated symptoms: cough and shortness of breath    Tamara Fry is a 70 y.o. female who presents to the Emergency Department complaining of cough.  She reports two days of dry, unproductive cough with occasional chest heaviness.  No fevers, abdomina pain, leg swelling or pain, hemoptysis.  Sxs are moderate and constant in nature with no clear alleviating or worsening factors.    Past Medical History  Diagnosis Date  . Diabetes mellitus   . Hypertension   . Migraine   . Hyperlipidemia    Past Surgical History  Procedure Laterality Date  . Ankle arthodesis w/ arthroscopy    . Abdominal hysterectomy    . Cholecystectomy    . Appendectomy    . Cataract extraction     Family History  Problem Relation Age of Onset  . Hypertension Mother   . Diabetes Mellitus II Father   . Prostate cancer Father    Social History  Substance Use Topics  . Smoking status: Former Smoker -- 1.00 packs/day for 5 years    Types: Cigarettes  . Smokeless tobacco: None  . Alcohol Use: No   OB History    No data available     Review of Systems  Respiratory: Positive for cough and shortness of breath.   Cardiovascular: Positive for chest pain.  All other systems reviewed and are negative.     Allergies  Review of patient's allergies indicates no known allergies.  Home Medications   Prior to Admission medications   Medication Sig Start Date End Date Taking? Authorizing Provider   aspirin EC 81 MG tablet Take 81 mg by mouth daily.   Yes Historical Provider, MD  desvenlafaxine (PRISTIQ) 50 MG 24 hr tablet Take 50 mg by mouth daily.   Yes Historical Provider, MD  donepezil (ARICEPT) 10 MG tablet Take 1 tablet (10 mg total) by mouth daily. 06/20/15  Yes Cameron Sprang, MD  insulin lispro protamine-lispro (HUMALOG 50/50 MIX) (50-50) 100 UNIT/ML SUSP injection Inject 0.3-0.45 mLs (30-45 Units total) into the skin 2 (two) times daily before a meal. Inject 30 units every morning and 45 units every night Patient taking differently: Inject 70 Units into the skin 2 (two) times daily before a meal.  06/17/14  Yes Hosie Poisson, MD  LORazepam (ATIVAN) 0.5 MG tablet Take 0.5 mg by mouth 3 (three) times daily as needed for anxiety or sleep.    Yes Historical Provider, MD  nebivolol (BYSTOLIC) 10 MG tablet Take 0.5 tablets (5 mg total) by mouth daily. Patient taking differently: Take 10 mg by mouth daily.  06/17/14  Yes Hosie Poisson, MD  traMADol (ULTRAM) 50 MG tablet Take 50 mg by mouth 2 (two) times daily as needed (headache). For pain. Maximum dose= 8 tablets per day   Yes Historical Provider, MD  valsartan (DIOVAN) 160 MG tablet Take 160 mg by mouth daily.     Yes  Historical Provider, MD  benzonatate (TESSALON) 100 MG capsule Take 1 capsule (100 mg total) by mouth 2 (two) times daily as needed for cough. 12/10/15   Quintella Reichert, MD  venlafaxine [facility]) 37.5 MG tablet Take 2 tablets at night 06/20/15   Cameron Sprang, MD   BP 137/87 mmHg  Pulse 54  Temp(Src) 97.9 F (36.6 C) (Oral)  Resp 18  SpO2 100% Physical Exam  Constitutional: She is oriented to person, place, and time. She appears well-developed and well-nourished.  HENT:  Head: Normocephalic and atraumatic.  Mouth/Throat: Oropharynx is clear and moist.  Cardiovascular: Normal rate and regular rhythm.   No murmur heard. Pulmonary/Chest: Effort normal and breath sounds normal. No respiratory distress.  Abdominal: Soft.  There is no tenderness. There is no rebound and no guarding.  Musculoskeletal: She exhibits no edema or tenderness.  Neurological: She is alert and oriented to person, place, and time.  Skin: Skin is warm and dry.  Psychiatric: She has a normal mood and affect. Her behavior is normal.  Nursing note and vitals reviewed.   ED Course  Procedures (including critical care time) Labs Review Labs Reviewed  BASIC METABOLIC PANEL - Abnormal; Notable for the following:    Glucose, Bld 279 (*)    BUN 52 (*)    Creatinine, Ser 2.24 (*)    GFR calc non Af Amer 21 (*)    GFR calc Af Amer 24 (*)    All other components within normal limits  CBC - Abnormal; Notable for the following:    Hemoglobin 11.7 (*)    All other components within normal limits  I-STAT TROPOININ, ED  I-STAT TROPOININ, ED    Imaging Review Dg Chest 2 View  12/10/2015  CLINICAL DATA:  3-4 days of shortness of breath EXAM: CHEST  2 VIEW COMPARISON:  PA and lateral chest x-Qadir of July 25, 2013 FINDINGS: The lungs are well-expanded and clear. The heart and pulmonary vascularity are normal. The mediastinum is normal in width. The trachea is midline. There is mild multilevel degenerative disc disease of the thoracic spine. IMPRESSION: There is no evidence of pneumonia nor other acute cardiopulmonary abnormality. Electronically Signed   By: David  Martinique M.D.   On: 12/10/2015 13:17   I have personally reviewed and evaluated these images and lab results as part of my medical decision-making.   EKG Interpretation   Date/Time:  Wednesday December 10 2015 12:48:14 EDT Ventricular Rate:  56 PR Interval:  162 QRS Duration: 110 QT Interval:  424 QTC Calculation: 409 R Axis:   20 Text Interpretation:  Sinus bradycardia Incomplete left bundle branch  block T wave abnormality, consider inferior ischemia Abnormal ECG  Confirmed by Hazle Coca [phone]) on 12/10/2015 4:04:40 PM      MDM   Final diagnoses:  Cough  Acute kidney  injury [facility])    Patient here for evaluation of cough. She denies any chest pain, diaphoresis, fevers. She does have some occasional chest heaviness that she relates to her cough. She is nontoxic appearing on examination with no respiratory distress. EKG with T-wave inversion that is similar when compared to an EKG from several years ago. Presentation is not consistent with ACS, CHF, PE. Discussed the patient's observation given her symptoms and patient declines. Discussed close outpatient follow-up and return precautions. BMP with acute kidney injury compared to prior, last available was 1-1/2 years ago. Discussed importance of PCP follow-up for recheck of her kidney function.    Quintella Reichert, MD 12/11/15  0135 

## 2015-12-10 NOTE — ED Notes (Signed)
PT is here with coughing, congestion in chest, and hoarse voice.  Symptoms started Sunday nite. Shortness of breath and reports heaviness in chest.

## 2015-12-12 DIAGNOSIS — E118 Type 2 diabetes mellitus with unspecified complications: Secondary | ICD-10-CM | POA: Diagnosis not present

## 2015-12-12 DIAGNOSIS — E789 Disorder of lipoprotein metabolism, unspecified: Secondary | ICD-10-CM | POA: Diagnosis not present

## 2015-12-12 DIAGNOSIS — N390 Urinary tract infection, site not specified: Secondary | ICD-10-CM | POA: Diagnosis not present

## 2015-12-12 DIAGNOSIS — I10 Essential (primary) hypertension: Secondary | ICD-10-CM | POA: Diagnosis not present

## 2015-12-17 ENCOUNTER — Ambulatory Visit: Payer: Self-pay | Admitting: Physician Assistant

## 2015-12-18 ENCOUNTER — Ambulatory Visit: Payer: Self-pay | Admitting: Neurology

## 2015-12-19 ENCOUNTER — Ambulatory Visit: Payer: Self-pay | Admitting: Neurology

## 2015-12-19 DIAGNOSIS — E789 Disorder of lipoprotein metabolism, unspecified: Secondary | ICD-10-CM | POA: Diagnosis not present

## 2015-12-19 DIAGNOSIS — E118 Type 2 diabetes mellitus with unspecified complications: Secondary | ICD-10-CM | POA: Diagnosis not present

## 2015-12-19 DIAGNOSIS — I10 Essential (primary) hypertension: Secondary | ICD-10-CM | POA: Diagnosis not present

## 2015-12-19 DIAGNOSIS — G4700 Insomnia, unspecified: Secondary | ICD-10-CM | POA: Diagnosis not present

## 2016-03-08 ENCOUNTER — Other Ambulatory Visit: Payer: Self-pay | Admitting: Nephrology

## 2016-03-08 DIAGNOSIS — I10 Essential (primary) hypertension: Secondary | ICD-10-CM

## 2016-03-08 DIAGNOSIS — N183 Chronic kidney disease, stage 3 (moderate): Secondary | ICD-10-CM

## 2016-03-16 ENCOUNTER — Other Ambulatory Visit: Payer: Self-pay

## 2016-03-17 ENCOUNTER — Ambulatory Visit
Admission: RE | Admit: 2016-03-17 | Discharge: 2016-03-17 | Disposition: A | Discharge status: Home / Self Care | Payer: Medicare Other | Source: Ambulatory Visit | Attending: Nephrology | Admitting: Nephrology

## 2016-03-17 DIAGNOSIS — N183 Chronic kidney disease, stage 3 (moderate): Secondary | ICD-10-CM

## 2016-03-17 DIAGNOSIS — I10 Essential (primary) hypertension: Secondary | ICD-10-CM

## 2016-03-19 ENCOUNTER — Ambulatory Visit: Payer: Self-pay | Admitting: Neurology

## 2016-06-09 DIAGNOSIS — I129 Hypertensive chronic kidney disease with stage 1 through stage 4 chronic kidney disease, or unspecified chronic kidney disease: Secondary | ICD-10-CM | POA: Diagnosis not present

## 2016-06-09 DIAGNOSIS — N184 Chronic kidney disease, stage 4 (severe): Secondary | ICD-10-CM | POA: Diagnosis not present

## 2016-06-09 DIAGNOSIS — N183 Chronic kidney disease, stage 3 (moderate): Secondary | ICD-10-CM | POA: Diagnosis not present

## 2016-06-09 DIAGNOSIS — E119 Type 2 diabetes mellitus without complications: Secondary | ICD-10-CM | POA: Diagnosis not present

## 2016-06-09 DIAGNOSIS — Z794 Long term (current) use of insulin: Secondary | ICD-10-CM | POA: Diagnosis not present

## 2016-09-30 DIAGNOSIS — I10 Essential (primary) hypertension: Secondary | ICD-10-CM | POA: Diagnosis not present

## 2016-09-30 DIAGNOSIS — E118 Type 2 diabetes mellitus with unspecified complications: Secondary | ICD-10-CM | POA: Diagnosis not present

## 2016-10-14 DIAGNOSIS — E119 Type 2 diabetes mellitus without complications: Secondary | ICD-10-CM | POA: Diagnosis not present

## 2016-10-14 DIAGNOSIS — N184 Chronic kidney disease, stage 4 (severe): Secondary | ICD-10-CM | POA: Diagnosis not present

## 2016-10-14 DIAGNOSIS — I129 Hypertensive chronic kidney disease with stage 1 through stage 4 chronic kidney disease, or unspecified chronic kidney disease: Secondary | ICD-10-CM | POA: Diagnosis not present

## 2016-10-14 DIAGNOSIS — Z794 Long term (current) use of insulin: Secondary | ICD-10-CM | POA: Diagnosis not present

## 2016-12-13 DIAGNOSIS — I129 Hypertensive chronic kidney disease with stage 1 through stage 4 chronic kidney disease, or unspecified chronic kidney disease: Secondary | ICD-10-CM | POA: Diagnosis not present

## 2016-12-13 DIAGNOSIS — N184 Chronic kidney disease, stage 4 (severe): Secondary | ICD-10-CM | POA: Diagnosis not present

## 2016-12-13 DIAGNOSIS — Z794 Long term (current) use of insulin: Secondary | ICD-10-CM | POA: Diagnosis not present

## 2016-12-13 DIAGNOSIS — E119 Type 2 diabetes mellitus without complications: Secondary | ICD-10-CM | POA: Diagnosis not present

## 2016-12-16 DIAGNOSIS — N289 Disorder of kidney and ureter, unspecified: Secondary | ICD-10-CM | POA: Diagnosis not present

## 2016-12-16 DIAGNOSIS — Z0000 Encounter for general adult medical examination without abnormal findings: Secondary | ICD-10-CM | POA: Diagnosis not present

## 2016-12-16 DIAGNOSIS — N390 Urinary tract infection, site not specified: Secondary | ICD-10-CM | POA: Diagnosis not present

## 2016-12-16 DIAGNOSIS — E789 Disorder of lipoprotein metabolism, unspecified: Secondary | ICD-10-CM | POA: Diagnosis not present

## 2016-12-16 DIAGNOSIS — E039 Hypothyroidism, unspecified: Secondary | ICD-10-CM | POA: Diagnosis not present

## 2016-12-16 DIAGNOSIS — E118 Type 2 diabetes mellitus with unspecified complications: Secondary | ICD-10-CM | POA: Diagnosis not present

## 2016-12-16 DIAGNOSIS — I639 Cerebral infarction, unspecified: Secondary | ICD-10-CM | POA: Diagnosis not present

## 2017-03-01 ENCOUNTER — Emergency Department (HOSPITAL_COMMUNITY): Payer: Medicare Other

## 2017-03-01 ENCOUNTER — Encounter (HOSPITAL_COMMUNITY): Payer: Self-pay

## 2017-03-01 ENCOUNTER — Emergency Department (HOSPITAL_COMMUNITY)
Admission: EM | Admit: 2017-03-01 | Discharge: 2017-03-01 | Disposition: A | Discharge status: Home / Self Care | Payer: Medicare Other | Attending: Emergency Medicine | Admitting: Emergency Medicine

## 2017-03-01 DIAGNOSIS — I10 Essential (primary) hypertension: Secondary | ICD-10-CM | POA: Insufficient documentation

## 2017-03-01 DIAGNOSIS — Z79899 Other long term (current) drug therapy: Secondary | ICD-10-CM | POA: Diagnosis not present

## 2017-03-01 DIAGNOSIS — Z7982 Long term (current) use of aspirin: Secondary | ICD-10-CM | POA: Insufficient documentation

## 2017-03-01 DIAGNOSIS — Y998 Other external cause status: Secondary | ICD-10-CM | POA: Insufficient documentation

## 2017-03-01 DIAGNOSIS — E119 Type 2 diabetes mellitus without complications: Secondary | ICD-10-CM | POA: Insufficient documentation

## 2017-03-01 DIAGNOSIS — S4992XA Unspecified injury of left shoulder and upper arm, initial encounter: Secondary | ICD-10-CM | POA: Diagnosis present

## 2017-03-01 DIAGNOSIS — Z794 Long term (current) use of insulin: Secondary | ICD-10-CM | POA: Diagnosis not present

## 2017-03-01 DIAGNOSIS — Y92008 Other place in unspecified non-institutional (private) residence as the place of occurrence of the external cause: Secondary | ICD-10-CM | POA: Insufficient documentation

## 2017-03-01 DIAGNOSIS — Y9389 Activity, other specified: Secondary | ICD-10-CM | POA: Diagnosis not present

## 2017-03-01 DIAGNOSIS — Z8673 Personal history of transient ischemic attack (TIA), and cerebral infarction without residual deficits: Secondary | ICD-10-CM | POA: Diagnosis not present

## 2017-03-01 DIAGNOSIS — Z87891 Personal history of nicotine dependence: Secondary | ICD-10-CM | POA: Insufficient documentation

## 2017-03-01 DIAGNOSIS — W010XXA Fall on same level from slipping, tripping and stumbling without subsequent striking against object, initial encounter: Secondary | ICD-10-CM | POA: Diagnosis not present

## 2017-03-01 DIAGNOSIS — Z9049 Acquired absence of other specified parts of digestive tract: Secondary | ICD-10-CM | POA: Diagnosis not present

## 2017-03-01 DIAGNOSIS — S43005A Unspecified dislocation of left shoulder joint, initial encounter: Secondary | ICD-10-CM | POA: Insufficient documentation

## 2017-03-01 DIAGNOSIS — W19XXXA Unspecified fall, initial encounter: Secondary | ICD-10-CM

## 2017-03-01 MED ORDER — PROPOFOL 10 MG/ML IV BOLUS
INTRAVENOUS | Status: AC | PRN
  Administered 2017-03-01: 50 mg via INTRAVENOUS

## 2017-03-01 MED ORDER — HYDROMORPHONE HCL 1 MG/ML IJ SOLN
1.0000 mg | Freq: Once | INTRAMUSCULAR | Status: AC
  Administered 2017-03-01: 1 mg via INTRAVENOUS
  Filled 2017-03-01: qty 1

## 2017-03-01 MED ORDER — FENTANYL CITRATE (PF) 100 MCG/2ML IJ SOLN
50.0000 ug | Freq: Once | INTRAMUSCULAR | Status: AC
  Administered 2017-03-01: 50 ug via INTRAVENOUS
  Filled 2017-03-01: qty 2

## 2017-03-01 MED ORDER — PROPOFOL 10 MG/ML IV BOLUS
0.5000 mg/kg | Freq: Once | INTRAVENOUS | Status: DC
  Filled 2017-03-01: qty 20

## 2017-03-01 NOTE — ED Triage Notes (Addendum)
Per Pt, Pt reports having a mechanical fall today on her way to the bathroom. Pt reports tripping over the carpet and falling to the ground with some impact to her left shoulder. Denies LOC and head impact. Reports lying in the floor for one hour.

## 2017-03-01 NOTE — ED Notes (Signed)
X-Hanke at bedside

## 2017-03-01 NOTE — Sedation Documentation (Addendum)
Wasted 150 mg of propofol with Ruben Reason, RN into sharps

## 2017-03-01 NOTE — ED Notes (Signed)
Shoulder immobilizer placed on pt per verbal order from MD

## 2017-03-01 NOTE — Sedation Documentation (Signed)
Called portable Xray for DG left shoulder. Procedure successful. Pt still sedated. HR 54, sats 100% on 2L [place], 110/58

## 2017-03-01 NOTE — ED Notes (Signed)
MD and PA at bedside to perform closed reduction of left shoulder. Time out performed.

## 2017-03-01 NOTE — Discharge Instructions (Signed)
Read the information below.  You may return to the Emergency Department at any time for worsening condition or any new symptoms that concern you.    Please keep your shoulder immobilizer on as much as possible until you are seen by the orthopedist.    Take tylenol or ibuprofen as needed for pain.  Use ice packs or cold compresses on your shoulder for pain.     If you develop uncontrolled pain, weakness or numbness of the arm, severe discoloration of the skin, or you are unable to use your hand, return to the ER for a recheck.

## 2017-03-01 NOTE — Sedation Documentation (Addendum)
Pt sedated.  

## 2017-03-01 NOTE — ED Notes (Signed)
Obtained informed consent for Closed reduction of left shoulder

## 2017-03-01 NOTE — ED Notes (Signed)
Pt's son at bedside to drive pt home. Pt alert and oriented

## 2017-03-01 NOTE — Sedation Documentation (Signed)
MD at bedside for procedural sedation.

## 2017-03-01 NOTE — ED Provider Notes (Signed)
[facility] DEPT Provider Note   CSN: [id number] Arrival date & time: 03/01/17  1779     History   Chief Complaint Chief Complaint  Patient presents with  . Fall    HPI Tamara Fry is a 71 y.o. female.  HPI   Pt p/w left shoulder pain after tripping and falling this morning while walking down the hallway.  Landed on her left shoulder on the floor.  Denies hitting her head or LOC.  Denies any other pain or injury.  Denies weakness or numbness in the hand.    Past Medical History:  Diagnosis Date  . Diabetes mellitus   . Hyperlipidemia   . Hypertension   . Migraine     Patient Active Problem List   Diagnosis Date Noted  . Essential hypertension 06/20/2015  . Mild cognitive impairment 12/18/2014  . Migraine without aura and without status migrainosus, not intractable 12/18/2014  . Benign paroxysmal positional vertigo 12/18/2014  . Vertigo 06/16/2014  . Migraine, unspecified, without mention of intractable migraine without mention of status migrainosus 04/20/2014  . 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 Chest pain 07/26/2013  . Headache 07/26/2013  . Diabetes mellitus ([place]) 07/26/2013  . CVA (cerebral infarction) 07/26/2013  . TIA (transient ischemic attack) 07/25/2013  . Diabetes ([place]) 04/07/2010  . Hyperlipidemia 04/07/2010  . OBESITY 04/07/2010  . CHRONIC PAIN SYNDROME 04/07/2010  . Hypertension 04/07/2010  . ARTHRITIS, CHRONIC 04/07/2010  . CHEST PAIN 04/07/2010  . MIGRAINES, HX OF 04/07/2010    Past Surgical History:  Procedure Laterality Date  . ABDOMINAL HYSTERECTOMY    . ANKLE ARTHODESIS W/ ARTHROSCOPY    . APPENDECTOMY    . CATARACT EXTRACTION    . CHOLECYSTECTOMY      OB History    No data available       Home Medications    Prior to Admission medications   Medication Sig Start Date End Date Taking? Authorizing Provider  aspirin EC 81 MG tablet Take 81 mg by mouth daily.    [provider]  benzonatate (TESSALON) 100 MG capsule  Take 1 capsule (100 mg total) by mouth 2 (two) times daily as needed for cough. 12/10/15   Quintella Reichert, MD  desvenlafaxine (PRISTIQ) 50 MG 24 hr tablet Take 50 mg by mouth daily.    [provider]  donepezil (ARICEPT) 10 MG tablet Take 1 tablet (10 mg total) by mouth daily. 06/20/15   Cameron Sprang, MD  insulin lispro protamine-lispro (HUMALOG 50/50 MIX) (50-50) 100 UNIT/ML SUSP injection Inject 0.3-0.45 mLs (30-45 Units total) into the skin 2 (two) times daily before a meal. Inject 30 units every morning and 45 units every night Patient taking differently: Inject 70 Units into the skin 2 (two) times daily before a meal.  06/17/14   Hosie Poisson, MD  LORazepam (ATIVAN) 0.5 MG tablet Take 0.5 mg by mouth 3 (three) times daily as needed for anxiety or sleep.     [provider]  nebivolol (BYSTOLIC) 10 MG tablet Take 0.5 tablets (5 mg total) by mouth daily. Patient taking differently: Take 10 mg by mouth daily.  06/17/14   Hosie Poisson, MD  traMADol (ULTRAM) 50 MG tablet Take 50 mg by mouth 2 (two) times daily as needed (headache). For pain. Maximum dose= 8 tablets per day    [provider]  valsartan (DIOVAN) 160 MG tablet Take 160 mg by mouth daily.      [provider]  venlafaxine (EFFEXOR) 37.5 MG tablet  Take 2 tablets at night 06/20/15   Cameron Sprang, MD    Family History Family History  Problem Relation Age of Onset  . Hypertension Mother   . Diabetes Mellitus II Father   . Prostate cancer Father     Social History Social History  Substance Use Topics  . Smoking status: Former Smoker    Packs/day: 1.00    Years: 5.00    Types: Cigarettes  . Smokeless tobacco: Never Used  . Alcohol use No     Allergies   Patient has no known allergies.   Review of Systems Review of Systems  All other systems reviewed and are negative.    Physical Exam Updated Vital Signs BP 133/63   Pulse 61   Temp (!) 96.4 F (35.8 C) (Oral)   Resp 12    Ht 5\' 6"  (1.676 m)   Wt 99.8 kg (220 lb)   SpO2 100%   BMI 35.51 kg/m   Physical Exam  Constitutional: She appears well-developed and well-nourished. No distress.  HENT:  Head: Normocephalic and atraumatic.  Neck: Neck supple.  Cardiovascular: Normal rate and regular rhythm.   Pulmonary/Chest: Effort normal and breath sounds normal. No respiratory distress. She has no wheezes. She has no rales.  Abdominal: Soft. She exhibits no distension. There is no tenderness. There is no rebound and no guarding.  Musculoskeletal:  Decreased ROM of left shoulder and pain with passive ROM, also limited.  No distal tenderness.  Grip strength is normal.   Radial pulse is intact.    Neurological: She is alert.  Skin: She is not diaphoretic.  Nursing note and vitals reviewed.    ED Treatments / Results  Labs (all labs ordered are listed, but only abnormal results are displayed) Labs Reviewed - No data to display  EKG  EKG Interpretation None       Radiology Dg Shoulder Left  Result Date: 03/01/2017 CLINICAL DATA:  Fall.  Pain and deformity. EXAM: LEFT SHOULDER - 2+ VIEW COMPARISON:  12/10/2006. FINDINGS: Anterior dislocation noted. Fracture fragment arising from the glenoid labrum versus degenerative change. Hill-Sachs lesion cannot be completely excluded. Small fracture fragment arising from the distal acromion, age undetermined cannot be excluded. IMPRESSION: 1. Anterior dislocation of the left shoulder. Associated fracture and/or degenerative change noted of the glenoid. Hill-Sachs lesion cannot be excluded . 2. Fracture fragment arising from the distal acromion, age undetermined cannot be excluded . Electronically Signed   By: Marcello Moores  Register   On: 03/01/2017 10:11   Dg Shoulder Left Portable  Result Date: 03/01/2017 CLINICAL DATA:  Post reduction left shoulder dislocation EXAM: LEFT SHOULDER - 2 VIEW COMPARISON:  Study obtained earlier in the day. FINDINGS: From my scapular views  obtained. The previously noted anterior shoulder dislocation has been reduced successfully. Currently no fracture or dislocation. There is osteoarthritic change in the acromioclavicular and glenohumeral joints with bony overgrowth along the inferior aspect of the acromion. IMPRESSION: Successful reduction of anterior dislocation. Currently no fracture or dislocation. 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 Bony overgrowth along the inferior acromion noted. This finding potentially may lead to impingement syndrome. It should be noted that MR is the imaging study of choice to assess for impingement syndrome if that entity is suspected clinically. Electronically Signed   By: Lowella Grip III M.D.   On: 03/01/2017 13:29    Procedures Procedures (including critical care time)  Medications Ordered in ED Medications  propofol (DIPRIVAN) 10 mg/mL bolus/IV push 49.9 mg ( Intravenous  Canceled Entry 03/01/17 1313)  fentaNYL (SUBLIMAZE) injection 50 mcg (50 mcg Intravenous Given 03/01/17 1030)  HYDROmorphone (DILAUDID) injection 1 mg (1 mg Intravenous Given 03/01/17 1224)  propofol (DIPRIVAN) 10 mg/mL bolus/IV push (50 mg Intravenous Given 03/01/17 1304)     Initial Impression / Assessment and Plan / ED Course  I have reviewed the triage vital signs and the nursing notes.  Pertinent labs & imaging results that were available during my care of the patient were reviewed by me and considered in my medical decision making (see chart for details).     Afebrile, nontoxic patient with mechanical fall in a hallway injuring her left shoulder.  Denies any other injury.  Pt also seen by Dr Billy Fischer.  Please see her note for sedation/reduction information.Pt feeling much better after reduction.    D/C home with sling immobilizer, orthopedic follow up.   Discussed result, findings, treatment, and follow up  with patient.  Pt given return precautions.  Pt verbalizes understanding and agrees with plan.        Final Clinical Impressions(s) / ED Diagnoses   Final diagnoses:  Shoulder dislocation, left, initial encounter  Fall, initial encounter    New Prescriptions Discharge Medication List as of 03/01/2017  1:44 PM       Clayton Bibles, PA-C 03/01/17 1548    Gareth Morgan, MD 03/07/17 [phone]

## 2017-03-01 NOTE — Sedation Documentation (Addendum)
Pt arouse able

## 2017-03-01 NOTE — ED Notes (Signed)
Pt placed on cardiac monitor, Co2 detector in place, ambu bag available at bedside and crash cart. Pt ready for procedure.

## 2017-03-01 NOTE — ED Notes (Signed)
Pt will require procedural sedation for reduction. Obtained consent for procedural sedation

## 2017-03-04 NOTE — ED Provider Notes (Signed)
Shared visit with [facility] PA-C.  Briefly, this is a 71 year old female who had a mechanical fall and immediately shoulder pain. Denies headache, head trauma, loss consciousness, neck pain, numbness, weakness, chest pain, abdominal pain or other injuries. Patient with left shoulder dislocation, nv intact, no sign of other injuries.   EKG Interpretation None       Physical Exam  BP 133/63   Pulse 61   Temp (!) 96.4 F (35.8 C) (Oral)   Resp 12   Ht 5\' 6"  (1.676 m)   Wt 99.8 kg (220 lb)   SpO2 100%   BMI 35.51 kg/m   Physical Exam  ED Course  .Sedation Date/Time: 03/04/2017 8:18 AM Performed by: Gareth Morgan Authorized by: Gareth Morgan   Consent:    Consent obtained:  Verbal and written   Consent given by:  Patient   Risks discussed:  Dysrhythmia, inadequate sedation, nausea, vomiting, respiratory compromise necessitating ventilatory assistance and intubation and prolonged hypoxia resulting in organ damage   Alternatives discussed:  Analgesia without sedation Indications:    Procedure performed:  Dislocation reduction   Procedure necessitating sedation performed by:  Physician performing sedation   Intended level of sedation:  Moderate (conscious sedation) Pre-sedation assessment:    ASA classification: class 2 - patient with mild systemic disease     Neck mobility: normal     Mouth opening:  3 or more finger widths   Thyromental distance:  3 finger widths   Pre-sedation assessments completed and reviewed: airway patency, cardiovascular function, hydration status, mental status, nausea/vomiting, pain level, respiratory function and temperature     History of difficult intubation: no   Immediate pre-procedure details:    Reassessment: Patient reassessed immediately prior to procedure     Reviewed: vital signs     Verified: bag valve mask available, emergency equipment available, intubation equipment available, IV patency confirmed and oxygen available   Procedure  details (see MAR for exact dosages):    Sedation start time:  03/01/2017 12:49 PM   Preoxygenation:  Nasal cannula   Sedation:  Propofol   Intra-procedure monitoring:  Blood pressure monitoring, cardiac monitor, continuous capnometry, continuous pulse oximetry, frequent LOC assessments and frequent vital sign checks   Intra-procedure events: none     Sedation end time:  03/01/2017 2:18 PM   Total sedation time (minutes):  20 Post-procedure details:    Attendance: Constant attendance by certified staff until patient recovered     Recovery: Patient returned to pre-procedure baseline     Post-sedation assessments completed and reviewed: airway patency, cardiovascular function, hydration status, mental status, nausea/vomiting and pain level     Patient is stable for discharge or admission: yes     Patient tolerance:  Tolerated well, no immediate complications Reduction of dislocation Date/Time: 03/04/2017 8:20 AM Performed by: Gareth Morgan Authorized by: Gareth Morgan  Consent: Verbal consent obtained. Written consent obtained. Risks and benefits: risks, benefits and alternatives were discussed Consent given by: patient Patient understanding: patient states understanding of the procedure being performed Patient consent: the patient's understanding of the procedure matches consent given Procedure consent: procedure consent matches procedure scheduled Relevant documents: relevant documents present and verified Test results: test results available and properly labeled Site marked: the operative site was marked Imaging studies: imaging studies available Required items: required blood products, implants, devices, and special equipment available Time out: Immediately prior to procedure a "time out" was called to verify the correct patient, procedure, equipment, support staff and site/side marked as required. Preparation:  Patient was prepped and draped in the usual sterile fashion. Local  anesthesia used: no  Anesthesia: Local anesthesia used: no  Sedation: Patient sedated: yes Sedation type: moderate (conscious) sedation Sedatives: propofol Vitals: Vital signs were monitored during sedation. Patient tolerance: Patient tolerated the procedure well with no immediate complications     MDM Shared visit with [facility] PA-C.  Briefly, this is a 71 year old female who had a mechanical fall and immediately shoulder pain. Denies headache, head trauma, loss consciousness, neck pain, numbness, weakness, chest pain, abdominal pain or other injuries. Patient with left shoulder dislocation, nv intact, no sign of other injuries. Attempted reduction with FARES technique without sedation, unable to obtain success, then performed sedation with successful reduction.    Gareth Morgan, MD 03/04/17 [phone]

## 2017-03-08 DIAGNOSIS — S43015A Anterior dislocation of left humerus, initial encounter: Secondary | ICD-10-CM | POA: Diagnosis not present

## 2017-04-06 DIAGNOSIS — S43015A Anterior dislocation of left humerus, initial encounter: Secondary | ICD-10-CM | POA: Diagnosis not present

## 2017-04-06 DIAGNOSIS — S43015D Anterior dislocation of left humerus, subsequent encounter: Secondary | ICD-10-CM | POA: Diagnosis not present

## 2017-04-21 DIAGNOSIS — M25312 Other instability, left shoulder: Secondary | ICD-10-CM | POA: Diagnosis not present

## 2017-04-27 DIAGNOSIS — E7800 Pure hypercholesterolemia, unspecified: Secondary | ICD-10-CM | POA: Diagnosis not present

## 2017-04-27 DIAGNOSIS — M25512 Pain in left shoulder: Secondary | ICD-10-CM | POA: Diagnosis not present

## 2017-04-27 DIAGNOSIS — Z23 Encounter for immunization: Secondary | ICD-10-CM | POA: Diagnosis not present

## 2017-04-27 DIAGNOSIS — Z794 Long term (current) use of insulin: Secondary | ICD-10-CM | POA: Diagnosis not present

## 2017-04-27 DIAGNOSIS — N390 Urinary tract infection, site not specified: Secondary | ICD-10-CM | POA: Diagnosis not present

## 2017-04-27 DIAGNOSIS — Z01818 Encounter for other preprocedural examination: Secondary | ICD-10-CM | POA: Diagnosis not present

## 2017-04-27 DIAGNOSIS — E1065 Type 1 diabetes mellitus with hyperglycemia: Secondary | ICD-10-CM | POA: Diagnosis not present

## 2017-04-30 DIAGNOSIS — E161 Other hypoglycemia: Secondary | ICD-10-CM | POA: Diagnosis not present

## 2017-04-30 DIAGNOSIS — R739 Hyperglycemia, unspecified: Secondary | ICD-10-CM | POA: Diagnosis not present

## 2017-05-09 DIAGNOSIS — E875 Hyperkalemia: Secondary | ICD-10-CM | POA: Diagnosis not present

## 2017-05-09 DIAGNOSIS — N183 Chronic kidney disease, stage 3 (moderate): Secondary | ICD-10-CM | POA: Diagnosis not present

## 2017-06-16 NOTE — H&P (Signed)
Tamara Fry is an 71 y.o. female.    Chief Complaint: left shoulder pain  HPI: Pt is a 71 y.o. female complaining of left shoulder pain for multiple years. Pain had continually increased since the beginning. X-rays in the clinic show end-stage arthritic changes of the left shoulder. Pt has tried various conservative treatments which have failed to alleviate their symptoms, including injections and therapy. Various options are discussed with the patient. Risks, benefits and expectations were discussed with the patient. Patient understand the risks, benefits and expectations and wishes to proceed with surgery.   PCP:  Jani Gravel, MD  D/C Plans: Home  PMH: Past Medical History:  Diagnosis Date  . Diabetes mellitus   . Hyperlipidemia   . Hypertension   . Migraine     PSH: Past Surgical History:  Procedure Laterality Date  . ABDOMINAL HYSTERECTOMY    . ANKLE ARTHODESIS W/ ARTHROSCOPY    . APPENDECTOMY    . CATARACT EXTRACTION    . CHOLECYSTECTOMY      Social History:  reports that she has quit smoking. Her smoking use included Cigarettes. She has a 5.00 pack-year smoking history. She has never used smokeless tobacco. She reports that she does not drink alcohol or use drugs.  Allergies:  No Known Allergies  Medications: No current facility-administered medications for this encounter.    Current Outpatient Prescriptions  Medication Sig Dispense Refill  . aspirin EC 81 MG tablet Take 81 mg by mouth daily.    . benzonatate (TESSALON) 100 MG capsule Take 1 capsule (100 mg total) by mouth 2 (two) times daily as needed for cough. 10 capsule 0  . desvenlafaxine (PRISTIQ) 50 MG 24 hr tablet Take 50 mg by mouth daily.    Marland Kitchen donepezil (ARICEPT) 10 MG tablet Take 1 tablet (10 mg total) by mouth daily. 30 tablet 11  . insulin lispro protamine-lispro (HUMALOG 50/50 MIX) (50-50) 100 UNIT/ML SUSP injection Inject 0.3-0.45 mLs (30-45 Units total) into the skin 2 (two) times daily before a meal.  Inject 30 units every morning and 45 units every night (Patient taking differently: Inject 70 Units into the skin 2 (two) times daily before a meal. ) 10 mL 1  . LORazepam (ATIVAN) 0.5 MG tablet Take 0.5 mg by mouth 3 (three) times daily as needed for anxiety or sleep.     . nebivolol (BYSTOLIC) 10 MG tablet Take 0.5 tablets (5 mg total) by mouth daily. (Patient taking differently: Take 10 mg by mouth daily. ) 30 tablet 0  . traMADol (ULTRAM) 50 MG tablet Take 50 mg by mouth 2 (two) times daily as needed (headache). For pain. Maximum dose= 8 tablets per day    . valsartan (DIOVAN) 160 MG tablet Take 160 mg by mouth daily.      Marland Kitchen venlafaxine (EFFEXOR) 37.5 MG tablet Take 2 tablets at night 60 tablet 11    No results found for this or any previous visit (from the past 48 hour(s)). No results found.  ROS: Pain with rom of the left upper extremity  Physical Exam:  Alert and oriented 71 y.o. female in no acute distress Cranial nerves 2-12 intact Cervical spine: full rom with no tenderness, nv intact distally Chest: active breath sounds bilaterally, no wheeze rhonchi or rales Heart: regular rate and rhythm, no murmur Abd: non tender non distended with active bowel sounds Hip is stable with rom  Left shoulder painful rom nv intact distally Crepitus with rom No rashes  Assessment/Plan Assessment: left shoulder  cuff arthropathy  Plan: Patient will undergo a left reverse total shoulder by Dr. Veverly Fells at [facility]. Risks benefits and expectations were discussed with the patient. Patient understand risks, benefits and expectations and wishes to proceed.

## 2017-06-21 DIAGNOSIS — E7800 Pure hypercholesterolemia, unspecified: Secondary | ICD-10-CM | POA: Diagnosis not present

## 2017-06-21 DIAGNOSIS — M25512 Pain in left shoulder: Secondary | ICD-10-CM | POA: Diagnosis not present

## 2017-06-21 DIAGNOSIS — Z794 Long term (current) use of insulin: Secondary | ICD-10-CM | POA: Diagnosis not present

## 2017-06-21 DIAGNOSIS — E1065 Type 1 diabetes mellitus with hyperglycemia: Secondary | ICD-10-CM | POA: Diagnosis not present

## 2017-06-21 DIAGNOSIS — Z01818 Encounter for other preprocedural examination: Secondary | ICD-10-CM | POA: Diagnosis not present

## 2017-06-23 ENCOUNTER — Encounter (HOSPITAL_COMMUNITY): Payer: Self-pay | Admitting: *Deleted

## 2017-06-23 ENCOUNTER — Encounter (HOSPITAL_COMMUNITY): Payer: Self-pay | Admitting: Emergency Medicine

## 2017-06-23 ENCOUNTER — Other Ambulatory Visit: Payer: Self-pay

## 2017-06-23 NOTE — Progress Notes (Signed)
Anesthesia Chart Review:  Pt is a same day work up   Pt is a 71 year old female scheduled for reverse L shoulder arthroplasty on 06/24/2017 with Netta Cedars, MD  - PCP is Jani Gravel, MD. Last office visit 04/27/17. Pt cleared for surgery by Thedora Hinders, NP in that office.  PMH includes:  HTN, DM (type I), hyperlipidemia, CKD (stage 4), stroke (2014), dementia. Former smoker. BMI 32  Medications include: ASA 81 mg, Aricept, Humalog 50/50, losartan  Labs will be obtained day of surgery.  - Notes from PCP's office indicate baseline Cr usually 1.5-2.4; most recent Cr 1.9 on 04/27/17 - HbA1c was 7.6 on 04/27/17  EKG 04/27/17 (Dr. Julianne Rice office): sinus bradycardia (53 bpm)  Echo 07/26/13:  - Left ventricle: The cavity size was normal. There was mild concentric hypertrophy. Systolic function was moderately reduced. The estimated ejection fraction was in the range of 35% to 40%. Diffuse hypokinesis.     Pt was hospitalized 12/10-14/14 for chest pain.  Echo (see above) showed cardiomyopathy. EF 40%. Troponin x3 were negative.  Pt offered inpatient cardiology work up but declined, preferring outpatient cardiology f/u for cardiomyopathy.  However, pt has not ever seen cardiology.    I spoke with Tamara Fry in PCP's office about pt's cardiac history.  Pt will need to have cardiac clearance prior to surgery.  Tamara Fry is putting in referral to cardiology.  I notified Tamara Fry in Dr. Veverly Fells' office.   Tamara Cass, FNP-BC [address] [facility] Short Stay Surgical [facility]/Anesthesiology Phone: [phone] 06/23/2017 3:08 PM

## 2017-06-24 ENCOUNTER — Inpatient Hospital Stay (HOSPITAL_COMMUNITY): Admission: RE | Admit: 2017-06-24 | Payer: Medicare Other | Source: Ambulatory Visit | Admitting: Orthopedic Surgery

## 2017-06-24 HISTORY — DX: Major depressive disorder, single episode, unspecified: F32.9

## 2017-06-24 HISTORY — DX: Other amnesia: R41.3

## 2017-06-24 HISTORY — DX: Depression, unspecified: F32.A

## 2017-06-24 HISTORY — DX: Cerebral infarction, unspecified: I63.9

## 2017-06-24 HISTORY — DX: Chronic kidney disease, unspecified: N18.9

## 2017-06-24 SURGERY — ARTHROPLASTY, SHOULDER, TOTAL, REVERSE
Anesthesia: General | Site: Shoulder | Laterality: Left

## 2017-10-27 DIAGNOSIS — I129 Hypertensive chronic kidney disease with stage 1 through stage 4 chronic kidney disease, or unspecified chronic kidney disease: Secondary | ICD-10-CM | POA: Diagnosis not present

## 2017-10-27 DIAGNOSIS — N184 Chronic kidney disease, stage 4 (severe): Secondary | ICD-10-CM | POA: Diagnosis not present

## 2017-10-27 DIAGNOSIS — E1122 Type 2 diabetes mellitus with diabetic chronic kidney disease: Secondary | ICD-10-CM | POA: Diagnosis not present

## 2017-10-27 DIAGNOSIS — Z794 Long term (current) use of insulin: Secondary | ICD-10-CM | POA: Diagnosis not present

## 2017-11-01 ENCOUNTER — Encounter: Payer: Self-pay | Admitting: Podiatry

## 2017-11-01 ENCOUNTER — Ambulatory Visit: Payer: Medicare Other | Admitting: Podiatry

## 2017-11-01 DIAGNOSIS — Q828 Other specified congenital malformations of skin: Secondary | ICD-10-CM | POA: Diagnosis not present

## 2017-11-01 NOTE — Patient Instructions (Signed)

## 2017-11-03 NOTE — Progress Notes (Signed)
Subjective: 72 year old female presents the office today for concerns of recurrent painful callus to the ball of her left foot.  She states this started getting painful in the last couple months and the area is painful with pressure in shoes.  She denies any redness or drainage or any swelling. Denies any systemic complaints such as fevers, chills, nausea, vomiting. No acute changes since last appointment, and no other complaints at this time.   Objective: AAO x3, NAD DP/PT pulses palpable bilaterally, CRT less than 3 seconds Hyperkeratotic lesion right foot submetatarsal 2 as well as left heel.  Upon debridement there is no underlying ulceration, drainage or any signs of infection.  No open lesions or pre-ulcerative lesions.  Tenderness palpation to the hyperkeratotic lesions prior to debridement.  No other areas of tenderness. No pain with calf compression, swelling, warmth, erythema  Assessment: Hyperkeratotic lesions left foot x2  Plan: -All treatment options discussed with the patient including all alternatives, risks, complications.  -Lesions were both sharply debrided today without any complications or bleeding.  Offloading pads were dispensed.  Discussed shoe modifications of symptoms continue to recur quickly and discussed an insert with offloading. -RTC 3 months or sooner if needed -Patient encouraged to call the office with any questions, concerns, change in symptoms.   Trula Slade DPM

## 2017-12-18 IMAGING — DX DG SHOULDER 2+V*L*
2 series · 2 of 2 positions shown · non-contrast
Comparison: 12/10/2006.

CLINICAL DATA: Fall.  Pain and deformity.

EXAM:
LEFT SHOULDER - 2+ VIEW

[w shoulder internal left]
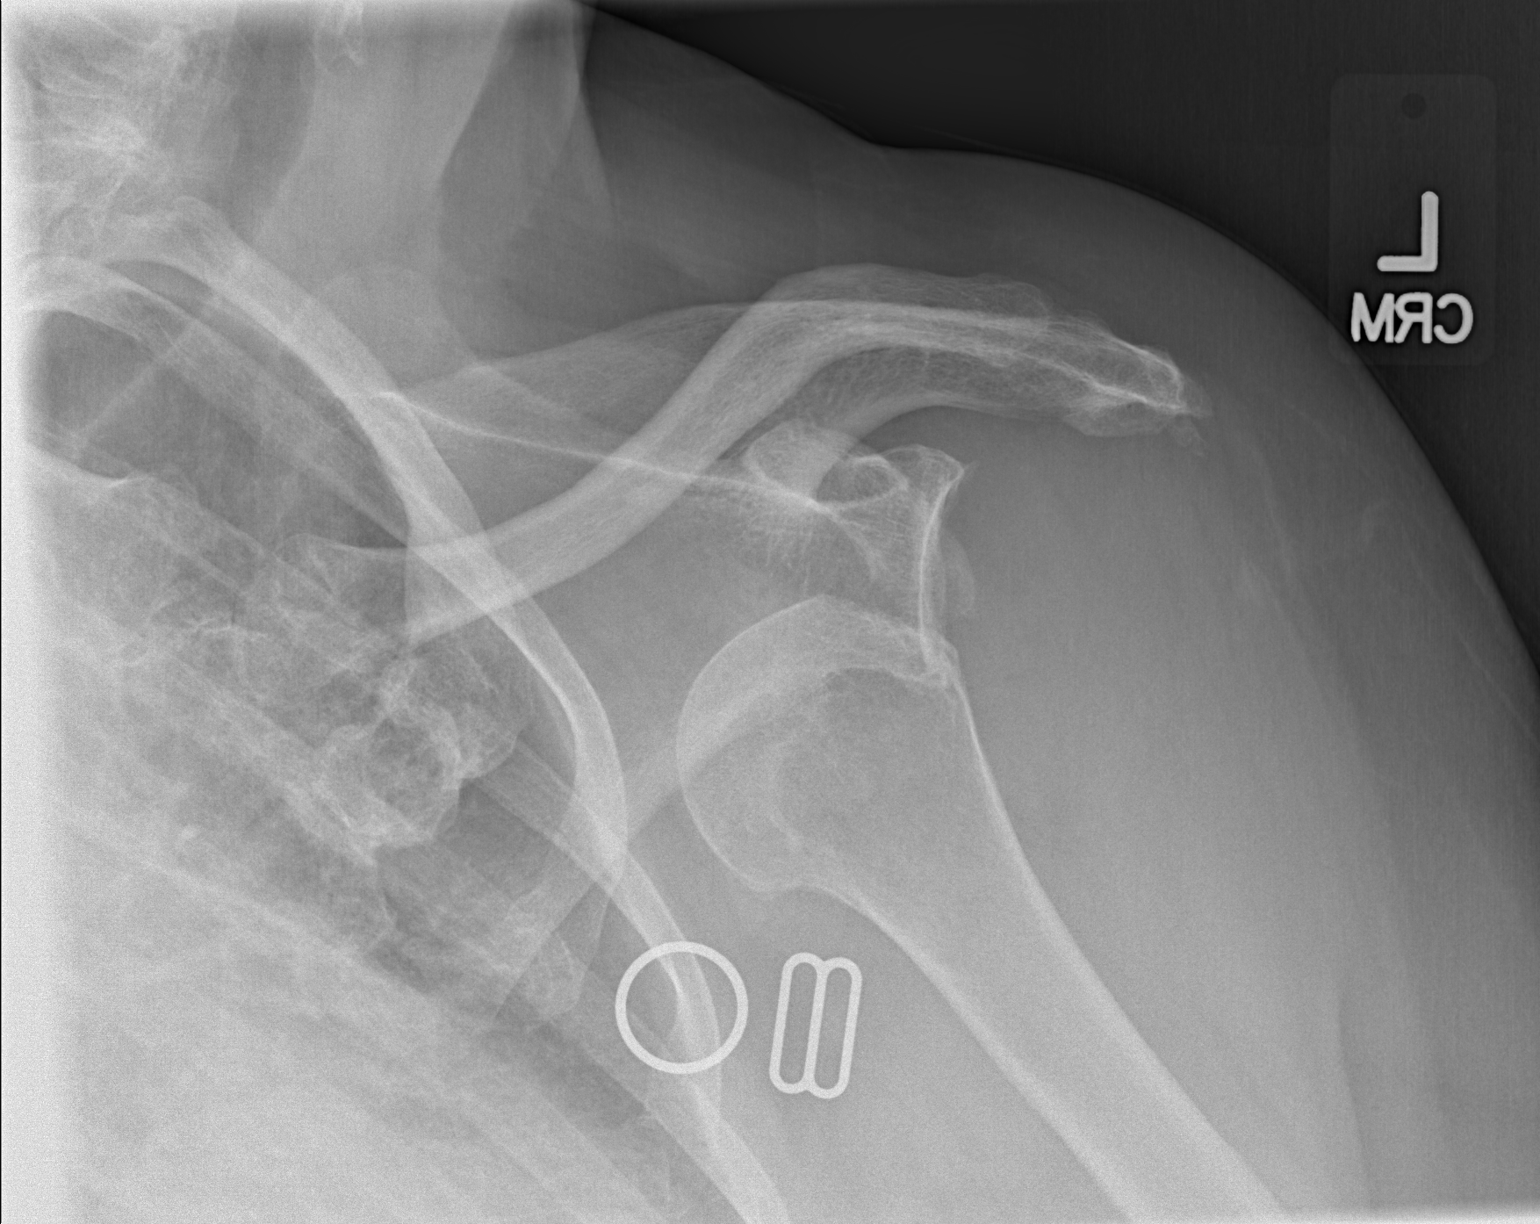

[w shoulder y-view left]
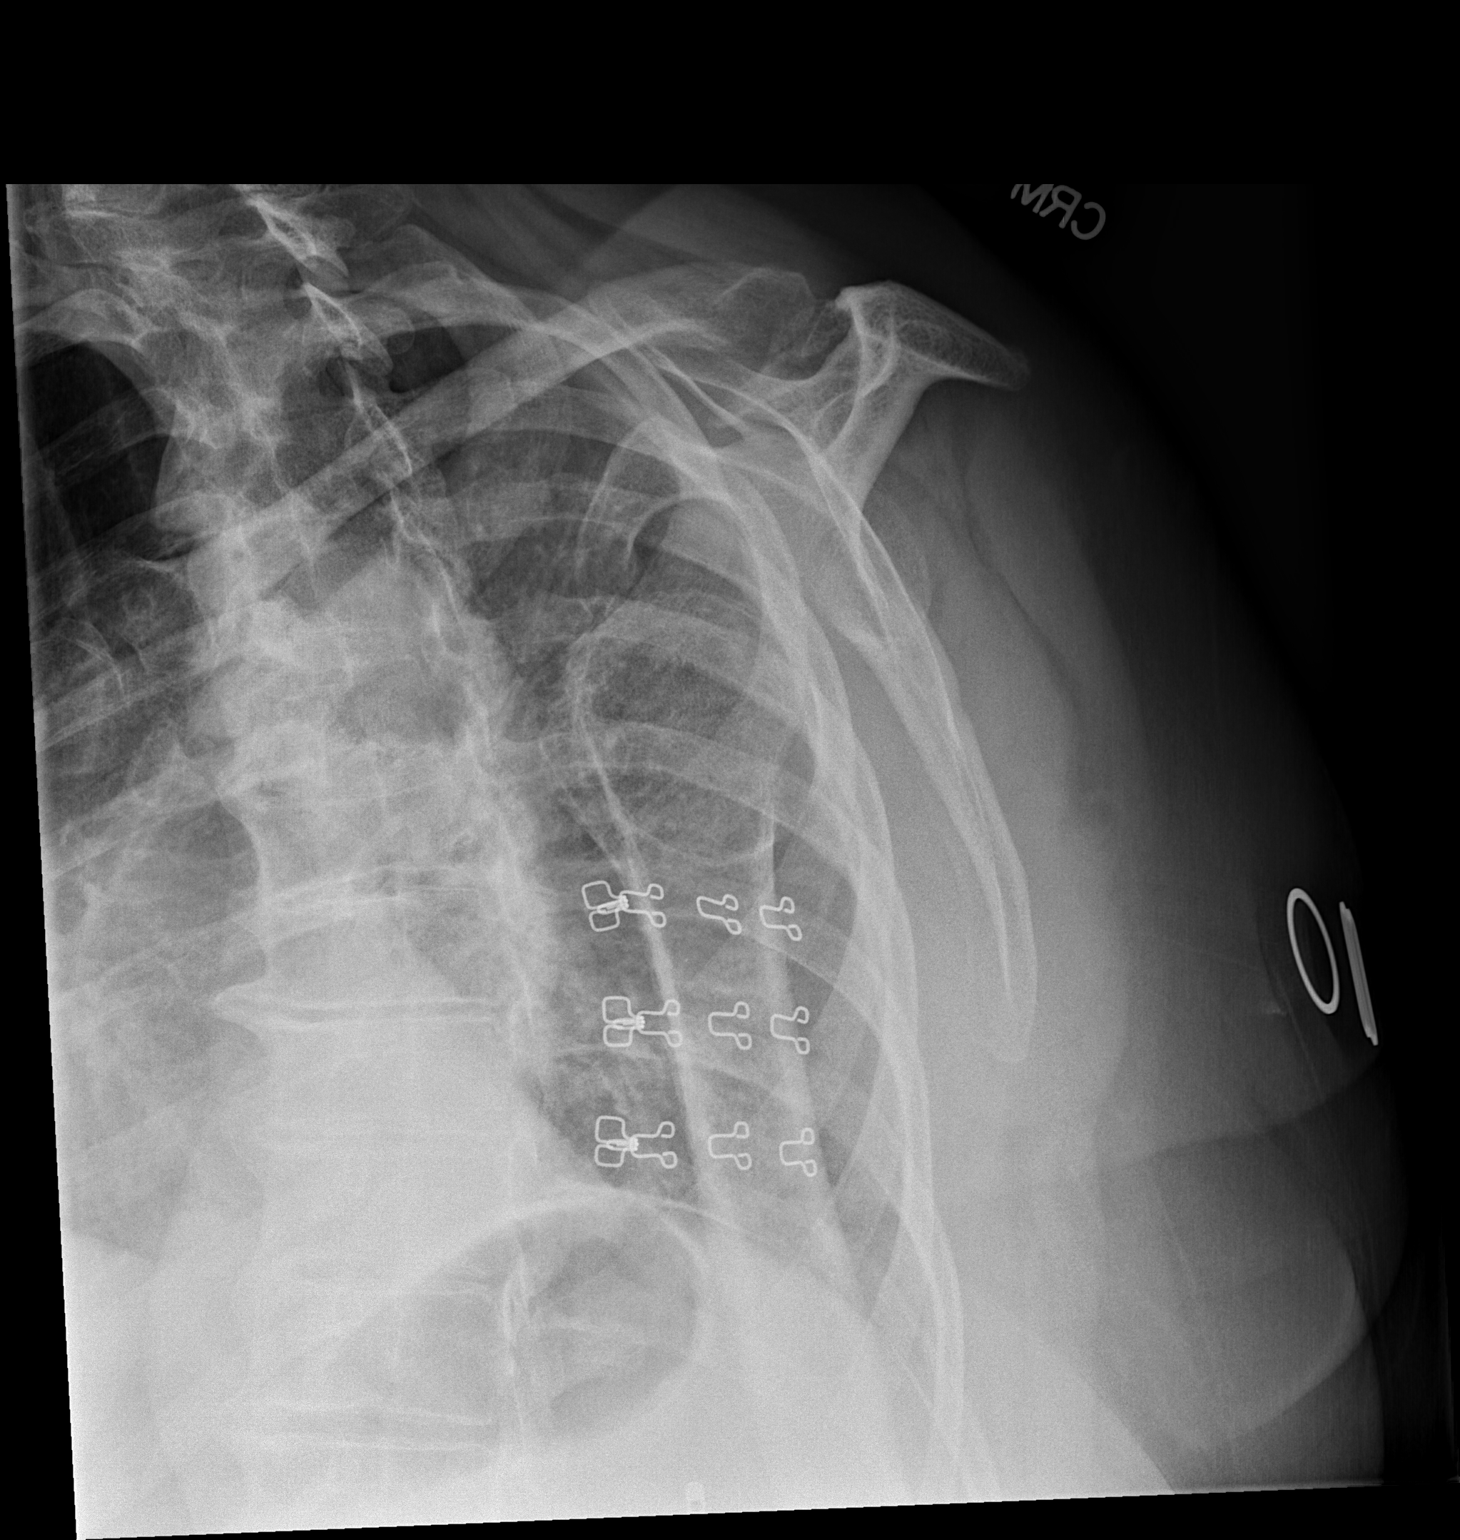

[2 of 2 positions shown; findings below may reference images not displayed]

FINDINGS: Anterior dislocation noted. Fracture fragment arising from the
glenoid labrum versus degenerative change. Hill-Sachs lesion cannot
be completely excluded. Small fracture fragment arising from the
distal acromion, age undetermined cannot be excluded.
IMPRESSION: 1. Anterior dislocation of the left shoulder. Associated fracture
and/or degenerative change noted of the glenoid. Hill-Sachs lesion
cannot be excluded .

2. Fracture fragment arising from the distal acromion, age
undetermined cannot be excluded .

## 2018-01-28 ENCOUNTER — Emergency Department (HOSPITAL_COMMUNITY): Payer: Medicare Other

## 2018-01-28 ENCOUNTER — Emergency Department (HOSPITAL_COMMUNITY)
Admission: EM | Admit: 2018-01-28 | Discharge: 2018-01-28 | Disposition: A | Discharge status: Home / Self Care | Payer: Medicare Other | Attending: Emergency Medicine | Admitting: Emergency Medicine

## 2018-01-28 ENCOUNTER — Encounter (HOSPITAL_COMMUNITY): Payer: Self-pay | Admitting: Emergency Medicine

## 2018-01-28 ENCOUNTER — Other Ambulatory Visit: Payer: Self-pay

## 2018-01-28 DIAGNOSIS — Y92009 Unspecified place in unspecified non-institutional (private) residence as the place of occurrence of the external cause: Secondary | ICD-10-CM | POA: Diagnosis not present

## 2018-01-28 DIAGNOSIS — S52591A Other fractures of lower end of right radius, initial encounter for closed fracture: Secondary | ICD-10-CM | POA: Diagnosis not present

## 2018-01-28 DIAGNOSIS — Y9301 Activity, walking, marching and hiking: Secondary | ICD-10-CM | POA: Insufficient documentation

## 2018-01-28 DIAGNOSIS — E1065 Type 1 diabetes mellitus with hyperglycemia: Secondary | ICD-10-CM | POA: Diagnosis not present

## 2018-01-28 DIAGNOSIS — W010XXA Fall on same level from slipping, tripping and stumbling without subsequent striking against object, initial encounter: Secondary | ICD-10-CM | POA: Diagnosis not present

## 2018-01-28 DIAGNOSIS — E785 Hyperlipidemia, unspecified: Secondary | ICD-10-CM | POA: Insufficient documentation

## 2018-01-28 DIAGNOSIS — S52612A Displaced fracture of left ulna styloid process, initial encounter for closed fracture: Secondary | ICD-10-CM | POA: Insufficient documentation

## 2018-01-28 DIAGNOSIS — N184 Chronic kidney disease, stage 4 (severe): Secondary | ICD-10-CM | POA: Diagnosis not present

## 2018-01-28 DIAGNOSIS — Z79899 Other long term (current) drug therapy: Secondary | ICD-10-CM | POA: Diagnosis not present

## 2018-01-28 DIAGNOSIS — R739 Hyperglycemia, unspecified: Secondary | ICD-10-CM

## 2018-01-28 DIAGNOSIS — Z8673 Personal history of transient ischemic attack (TIA), and cerebral infarction without residual deficits: Secondary | ICD-10-CM | POA: Diagnosis not present

## 2018-01-28 DIAGNOSIS — Z7982 Long term (current) use of aspirin: Secondary | ICD-10-CM | POA: Diagnosis not present

## 2018-01-28 DIAGNOSIS — Y998 Other external cause status: Secondary | ICD-10-CM | POA: Diagnosis not present

## 2018-01-28 DIAGNOSIS — I10 Essential (primary) hypertension: Secondary | ICD-10-CM | POA: Diagnosis not present

## 2018-01-28 DIAGNOSIS — R0902 Hypoxemia: Secondary | ICD-10-CM | POA: Diagnosis not present

## 2018-01-28 DIAGNOSIS — R609 Edema, unspecified: Secondary | ICD-10-CM | POA: Diagnosis not present

## 2018-01-28 DIAGNOSIS — I129 Hypertensive chronic kidney disease with stage 1 through stage 4 chronic kidney disease, or unspecified chronic kidney disease: Secondary | ICD-10-CM | POA: Insufficient documentation

## 2018-01-28 DIAGNOSIS — S52571A Other intraarticular fracture of lower end of right radius, initial encounter for closed fracture: Secondary | ICD-10-CM | POA: Diagnosis not present

## 2018-01-28 DIAGNOSIS — S59911A Unspecified injury of right forearm, initial encounter: Secondary | ICD-10-CM | POA: Diagnosis present

## 2018-01-28 DIAGNOSIS — E1165 Type 2 diabetes mellitus with hyperglycemia: Secondary | ICD-10-CM | POA: Diagnosis not present

## 2018-01-28 DIAGNOSIS — Z87891 Personal history of nicotine dependence: Secondary | ICD-10-CM | POA: Diagnosis not present

## 2018-01-28 DIAGNOSIS — Z794 Long term (current) use of insulin: Secondary | ICD-10-CM | POA: Insufficient documentation

## 2018-01-28 LAB — I-STAT VENOUS BLOOD GAS, ED
Acid-base deficit: 4 mmol/L — ABNORMAL HIGH (ref 0.0–2.0)
BICARBONATE: 22.3 mmol/L (ref 20.0–28.0)
O2 Saturation: 55 %
PH VEN: 7.304 (ref 7.250–7.430)
PO2 VEN: 32 mmHg (ref 32.0–45.0)
TCO2: 24 mmol/L (ref 22–32)
pCO2, Ven: 44.8 mmHg (ref 44.0–60.0)

## 2018-01-28 LAB — CBC WITH DIFFERENTIAL/PLATELET
ABS IMMATURE GRANULOCYTES: 0.1 10*3/uL (ref 0.0–0.1)
BASOS ABS: 0 10*3/uL (ref 0.0–0.1)
Basophils Relative: 0 %
EOS PCT: 2 %
Eosinophils Absolute: 0.1 10*3/uL (ref 0.0–0.7)
HEMATOCRIT: 37.2 % (ref 36.0–46.0)
HEMOGLOBIN: 11.7 g/dL — AB (ref 12.0–15.0)
Immature Granulocytes: 1 %
LYMPHS ABS: 1.6 10*3/uL (ref 0.7–4.0)
LYMPHS PCT: 17 %
MCH: 28.9 pg (ref 26.0–34.0)
MCHC: 31.5 g/dL (ref 30.0–36.0)
MCV: 91.9 fL (ref 78.0–100.0)
MONO ABS: 0.7 10*3/uL (ref 0.1–1.0)
Monocytes Relative: 8 %
NEUTROS ABS: 6.5 10*3/uL (ref 1.7–7.7)
Neutrophils Relative %: 72 %
Platelets: 184 10*3/uL (ref 150–400)
RBC: 4.05 MIL/uL (ref 3.87–5.11)
RDW: 12.3 % (ref 11.5–15.5)
WBC: 9.1 10*3/uL (ref 4.0–10.5)

## 2018-01-28 LAB — BASIC METABOLIC PANEL
ANION GAP: 10 (ref 5–15)
BUN: 37 mg/dL — AB (ref 6–20)
CHLORIDE: 99 mmol/L — AB (ref 101–111)
CO2: 23 mmol/L (ref 22–32)
Calcium: 9.2 mg/dL (ref 8.9–10.3)
Creatinine, Ser: 2.06 mg/dL — ABNORMAL HIGH (ref 0.44–1.00)
GFR calc Af Amer: 27 mL/min — ABNORMAL LOW (ref >60)
GFR, EST NON AFRICAN AMERICAN: 23 mL/min — AB (ref >60)
GLUCOSE: 451 mg/dL — AB (ref 65–99)
POTASSIUM: 4.9 mmol/L (ref 3.5–5.1)
Sodium: 132 mmol/L — ABNORMAL LOW (ref 135–145)

## 2018-01-28 LAB — CBG MONITORING, ED: GLUCOSE-CAPILLARY: 451 mg/dL — AB (ref 65–99)

## 2018-01-28 MED ORDER — HYDROCODONE-ACETAMINOPHEN 5-325 MG PO TABS: 1.0000 | ORAL_TABLET | ORAL | 0 refills | Status: DC | PRN

## 2018-01-28 MED ORDER — HYDROCODONE-ACETAMINOPHEN 5-325 MG PO TABS
1.0000 | ORAL_TABLET | Freq: Once | ORAL | Status: AC
  Administered 2018-01-28: 1 via ORAL
  Filled 2018-01-28: qty 1

## 2018-01-28 MED ORDER — SODIUM CHLORIDE 0.9 % IV BOLUS
1000.0000 mL | Freq: Once | INTRAVENOUS | Status: AC
  Administered 2018-01-28: 1000 mL via INTRAVENOUS

## 2018-01-28 MED ORDER — FENTANYL CITRATE (PF) 100 MCG/2ML IJ SOLN
50.0000 ug | INTRAMUSCULAR | Status: DC | PRN
  Administered 2018-01-28: 50 ug via INTRAVENOUS
  Filled 2018-01-28: qty 2

## 2018-01-28 NOTE — Progress Notes (Signed)
Orthopedic Tech Progress Note Patient Details:  Tamara Fry 1945/09/13 [id number]  Ortho Devices Type of Ortho Device: Ace wrap, Arm sling, Sugartong splint Ortho Device/Splint Interventions: Application   Post Interventions Patient Tolerated: Well Instructions Provided: Care of device   [name] 01/28/2018, 7:00 PM

## 2018-01-28 NOTE — Discharge Instructions (Addendum)
If your wrist pain significantly worsens or you develop numbness/tingling of the fingers or hand or any other new/concerning symptoms then return to the ER for evaluation.  Your glucose is quite elevated today and you need to follow-up closely with your primary care physician to help control this better as an outpatient.

## 2018-01-28 NOTE — ED Notes (Signed)
Ortho techs at bedside.

## 2018-01-28 NOTE — ED Notes (Signed)
ED Provider at bedside. 

## 2018-01-28 NOTE — ED Provider Notes (Signed)
[facility] EMERGENCY DEPARTMENT Provider Note   CSN: [id number] Arrival date & time: 01/28/18  1713     History   Chief Complaint Chief Complaint  Patient presents with  . Fall    HPI Tamara Fry is a 72 y.o. female.  HPI  72 year old female presents after a trip and fall.  She was walking at her house and tripped over a piece of wood.  Her right wrist landed on the concrete.  She is not sure if it was with an outstretched hand or how she landed.  Has been having pain and swelling since.  Given IV fentanyl, 50 mcg by EMS.  Feels significantly better and now only has mild soreness.  No weakness or numbness.  No hand pain or forearm pain.  Did not hit her head or lose consciousness.  Past Medical History:  Diagnosis Date  . Chronic kidney disease    Stage 4  . Depression   . Diabetes mellitus    Type I per office notes  . Hyperlipidemia   . Hypertension   . Memory loss    "short term"  . Migraine   . Stroke [facility])    TIA - no residual    Patient Active Problem List   Diagnosis Date Noted  . Essential hypertension 06/20/2015  . Mild cognitive impairment 12/18/2014  . Migraine without aura and without status migrainosus, not intractable 12/18/2014  . Benign paroxysmal positional vertigo 12/18/2014  . Vertigo 06/16/2014  . Migraine, unspecified, without mention of intractable migraine without mention of status migrainosus 04/20/2014  . 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 Chest pain 07/26/2013  . Headache 07/26/2013  . Diabetes mellitus ([place]) 07/26/2013  . CVA (cerebral infarction) 07/26/2013  . TIA (transient ischemic attack) 07/25/2013  . Diabetes ([place]) 04/07/2010  . Hyperlipidemia 04/07/2010  . OBESITY 04/07/2010  . CHRONIC PAIN SYNDROME 04/07/2010  . Hypertension 04/07/2010  . ARTHRITIS, CHRONIC 04/07/2010  . CHEST PAIN 04/07/2010  . MIGRAINES, HX OF 04/07/2010    Past Surgical History:  Procedure Laterality Date  . ABDOMINAL HYSTERECTOMY     . ANKLE ARTHODESIS W/ ARTHROSCOPY Left   . CATARACT EXTRACTION Bilateral   . CHOLECYSTECTOMY       OB History   None      Home Medications    Prior to Admission medications   Medication Sig Start Date End Date Taking? Authorizing Provider  aspirin EC 81 MG tablet Take 81 mg by mouth daily.   Yes [provider]  benzonatate (TESSALON) 100 MG capsule Take 1 capsule (100 mg total) by mouth 2 (two) times daily as needed for cough. 12/10/15  Yes Quintella Reichert, MD  desvenlafaxine (PRISTIQ) 50 MG 24 hr tablet Take 50 mg by mouth daily.   Yes [provider]  diphenhydrAMINE (BENADRYL) 25 MG tablet Take 25 mg by mouth at bedtime as needed for allergies.   Yes [provider]  donepezil (ARICEPT) 10 MG tablet Take 1 tablet (10 mg total) by mouth daily. 06/20/15  Yes Cameron Sprang, MD  FENOFIBRATE PO Take 1 tablet by mouth daily.   Yes [provider]  ibuprofen (ADVIL,MOTRIN) 200 MG tablet Take 200 mg by mouth every 6 (six) hours as needed for headache.   Yes [provider]  insulin lispro protamine-lispro (HUMALOG 50/50 MIX) (50-50) 100 UNIT/ML SUSP injection Inject 0.3-0.45 mLs (30-45 Units total) into the skin 2 (two) times daily before a meal. Inject 30 units every morning and 45  units every night Patient taking differently: Inject 50 Units 2 (two) times daily into the skin.  06/17/14  Yes Hosie Poisson, MD  losartan (COZAAR) 50 MG tablet Take 50 mg by mouth daily.   Yes [provider]  nebivolol (BYSTOLIC) 10 MG tablet Take 0.5 tablets (5 mg total) by mouth daily. 06/17/14  Yes Hosie Poisson, MD  sodium chloride (OCEAN) 0.65 % SOLN nasal spray Place 1 spray into both nostrils as needed for congestion.   Yes [provider]  venlafaxine (EFFEXOR) 37.5 MG tablet Take 2 tablets at night 06/20/15  Yes Cameron Sprang, MD  HYDROcodone-acetaminophen (NORCO) 5-325 MG tablet Take 1 tablet by mouth every 4 (four) hours as needed for  severe pain. 01/28/18   Sherwood Gambler, MD  traMADol (ULTRAM) 50 MG tablet Take 50 mg by mouth 2 (two) times daily as needed (headache). For pain. Maximum dose= 8 tablets per day    [provider]    Family History Family History  Problem Relation Age of Onset  . Hypertension Mother   . Diabetes Mellitus II Father   . Prostate cancer Father     Social History Social History   Tobacco Use  . Smoking status: Former Smoker    Packs/day: 1.00    Years: 5.00    Pack years: 5.00    Types: Cigarettes  . Smokeless tobacco: Never Used  . Tobacco comment: 06/22/17- "quit at least 40 years ago"  Substance Use Topics  . Alcohol use: No    Alcohol/week: 0.0 oz  . Drug use: No     Allergies   Patient has no known allergies.   Review of Systems Review of Systems  Musculoskeletal: Positive for arthralgias and joint swelling.  Neurological: Negative for dizziness, syncope, weakness, numbness and headaches.     Physical Exam Updated Vital Signs BP (!) 138/93 (BP Location: Left Arm)   Pulse (!) 55   Temp 98.9 F (37.2 C) (Oral)   Resp 16   Ht 5\' 5"  (1.651 m)   Wt 90.7 kg (200 lb)   SpO2 100%   BMI 33.28 kg/m   Physical Exam  Constitutional: She is oriented to person, place, and time. She appears well-developed and well-nourished.  obese  HENT:  Head: Normocephalic and atraumatic.  Right Ear: External ear normal.  Left Ear: External ear normal.  Nose: Nose normal.  Eyes: Right eye exhibits no discharge. Left eye exhibits no discharge.  Cardiovascular: Normal rate, regular rhythm and intact distal pulses.  Pulses:      Radial pulses are 2+ on the right side.  Pulmonary/Chest: Effort normal.  Abdominal: She exhibits no distension.  Musculoskeletal:       Right wrist: She exhibits bony tenderness and swelling. She exhibits normal range of motion.       Right forearm: She exhibits no tenderness.       Right hand: She exhibits no tenderness. Normal sensation  noted. Normal strength noted.  Normal strength/sensation in RUE  Neurological: She is alert and oriented to person, place, and time.  Skin: Skin is warm and dry.  Nursing note and vitals reviewed.    ED Treatments / Results  Labs (all labs ordered are listed, but only abnormal results are displayed) Labs Reviewed  BASIC METABOLIC PANEL - Abnormal; Notable for the following components:      Result Value   Sodium 132 (*)    Chloride 99 (*)    Glucose, Bld 451 (*)    BUN  37 (*)    Creatinine, Ser 2.06 (*)    GFR calc non Af Amer 23 (*)    GFR calc Af Amer 27 (*)    All other components within normal limits  CBC WITH DIFFERENTIAL/PLATELET - Abnormal; Notable for the following components:   Hemoglobin 11.7 (*)    All other components within normal limits  CBG MONITORING, ED - Abnormal; Notable for the following components:   Glucose-Capillary 451 (*)    All other components within normal limits  I-STAT VENOUS BLOOD GAS, ED - Abnormal; Notable for the following components:   Acid-base deficit 4.0 (*)    All other components within normal limits  URINALYSIS, ROUTINE W REFLEX MICROSCOPIC    EKG None  Radiology Dg Wrist Complete Right  Result Date: 01/28/2018 CLINICAL DATA:  [place] on outstretched hand.  Pain. EXAM: RIGHT WRIST - COMPLETE 3+ VIEW COMPARISON:  None. FINDINGS: Acute, closed, intra-articular and dorsally angulated comminuted fracture of the right distal radial metaphysis and epiphysis extending into the radiocarpal joint at the level of the lunate fossa as well as distal radioulnar joint. Minimally avulsed ulnar styloid fracture. Associated soft tissue swelling about the wrist more so dorsally. 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 Carpal rows are maintained. IMPRESSION: Acute intra-articular and dorsally angulated fracture of the distal radius with avulsed ulnar styloid as above. Periarticular soft tissue swelling secondary to fracture. Electronically Signed   By: Ashley Royalty M.D.   On: 01/28/2018 18:05    Procedures Procedures (including critical care time)  Medications Ordered in ED Medications  fentaNYL (SUBLIMAZE) injection 50 mcg (50 mcg Intravenous Given 01/28/18 1757)  HYDROcodone-acetaminophen (NORCO/VICODIN) 5-325 MG per tablet 1 tablet (1 tablet Oral Given 01/28/18 1906)  sodium chloride 0.9 % bolus 1,000 mL (0 mLs Intravenous Stopped 01/28/18 2136)     Initial Impression / Assessment and Plan / ED Course  I have reviewed the triage vital signs and the nursing notes.  Pertinent labs & imaging results that were available during my care of the patient were reviewed by me and considered in my medical decision making (see chart for details).     Patient with mechanical fall and subsequent radius fracture.  There is no significant deformity and while there is mild displacement on the x[place] it does not appear to need emergent reduction.  She is neurovascular intact.  Discussed with Dr. Lenon Curt, who asked for her to be placed in splint and follow-up with him by calling the office in 2 days on Monday.  While in the ED her glucose had been checked and was over 400.  Given this, labs evaluated.  Her renal function is near baseline.  Her bicarbonate is 23 and anion gap 10.  pH over 7.3.  This is not consistent with DKA.  She was given some fluids and will take her insulin when she gets home.  However I discussed she needs better glucose control and the need to follow-up with hand surgery.  We discussed return precautions.  She will be given hydrocodone for pain.  Final Clinical Impressions(s) / ED Diagnoses   Final diagnoses:  Other closed intra-articular fracture of distal end of right radius, initial encounter  Displaced fracture of left ulna styloid process, initial encounter for closed fracture  Hyperglycemia    ED Discharge Orders        Ordered    HYDROcodone-acetaminophen (NORCO) 5-325 MG tablet  Every 4 hours PRN     01/28/18 2100  Sherwood Gambler, MD 01/28/18 2147

## 2018-01-28 NOTE — ED Notes (Signed)
Patient transported to [facility] 

## 2018-01-28 NOTE — ED Triage Notes (Addendum)
Per EMS, pt from home. Pt tripped over a ramp at her back door. Fell on rt wrist. Pt reported pain. Obvious deformity and swelling to extremity. EMS gave 50 Fentanyl and 247mls of NS. Pt not on blood thinners. Pt denies hitting head. Hx diabetes, pt took her insulin this morning.  EMS VS BP 150/96, P 70s. CBG over 600. Pt A&Ox4 and ambulatory.

## 2018-02-01 DIAGNOSIS — S52531A Colles' fracture of right radius, initial encounter for closed fracture: Secondary | ICD-10-CM | POA: Diagnosis not present

## 2018-02-07 ENCOUNTER — Ambulatory Visit (INDEPENDENT_AMBULATORY_CARE_PROVIDER_SITE_OTHER): Payer: Self-pay | Admitting: Cardiology

## 2018-02-07 ENCOUNTER — Encounter: Payer: Self-pay | Admitting: *Deleted

## 2018-02-07 ENCOUNTER — Encounter: Payer: Self-pay | Admitting: Cardiology

## 2018-02-07 VITALS — BP 110/56 | HR 67 | Ht 65.0 in | Wt 199.8 lb

## 2018-02-07 DIAGNOSIS — G3184 Mild cognitive impairment, so stated: Secondary | ICD-10-CM

## 2018-02-07 DIAGNOSIS — E118 Type 2 diabetes mellitus with unspecified complications: Secondary | ICD-10-CM

## 2018-02-07 DIAGNOSIS — I10 Essential (primary) hypertension: Secondary | ICD-10-CM

## 2018-02-07 DIAGNOSIS — I6381 Other cerebral infarction due to occlusion or stenosis of small artery: Secondary | ICD-10-CM

## 2018-02-07 DIAGNOSIS — Z794 Long term (current) use of insulin: Secondary | ICD-10-CM

## 2018-02-07 DIAGNOSIS — R072 Precordial pain: Secondary | ICD-10-CM

## 2018-02-07 NOTE — Patient Instructions (Signed)
Medication Instructions:  Your physician recommends that you continue on your current medications as directed. Please refer to the Current Medication list given to you today.   Labwork: None  Testing/Procedures: You had an EKG today.   Your physician has requested that you have an echocardiogram. Echocardiography is a painless test that uses sound waves to create images of your heart. It provides your doctor with information about the size and shape of your heart and how well your heart's chambers and valves are working. This procedure takes approximately one hour. There are no restrictions for this procedure.  Your physician has requested that you have a lexiscan myoview. For further information please visit [email or web address]. Please follow instruction sheet, as given.   Follow-Up: Your physician recommends that you schedule a follow-up appointment in: 1 month.  If you need a refill on your cardiac medications before your next appointment, please call your pharmacy.   Thank you for choosing [facility]! Seymone Forlenza, RN [phone]    

## 2018-02-07 NOTE — Progress Notes (Signed)
Cardiology Consultation:    Date:  02/07/2018   ID:  Tamara Fry, DOB Feb 01, 1946, MRN [id number]  PCP:  Jani Gravel, MD  Cardiologist:  Jenne Campus, MD   Referring MD: Jani Gravel, MD   Chief Complaint  Patient presents with  . Pre-op Exam  I need surgery  History of Present Illness:    Tamara Fry is a 72 y.o. female who is being seen today for the evaluation of preop evaluation at the request of Jani Gravel, MD.  Last Saturday which was 4 days ago she fell down she tripped on something fell down and broke her wrist she required surgery she was sent to [place] for medical optimization.  She denies having any chest pain tightness squeezing pressure burning chest.  Interestingly 1 of the diagnosis in the chart is chest pain.  She does have some mental impairment but overall I have regular conversation with her with no difficulties.  She does have multiple risk factors for coronary artery disease namely diabetes and hypertension.  She apparently did not call on cholesterol medication but she does not remember the name of it.  She smoked many years ago and quit more than 30 years ago.  She does not have family history of premature coronary artery disease.  She is walking around but does not exercise on the regular basis.  Past Medical History:  Diagnosis Date  . Chronic kidney disease    Stage 4  . Depression   . Diabetes mellitus    Type I per office notes  . Hyperlipidemia   . Hypertension   . Memory loss    "short term"  . Migraine   . Stroke [facility])    TIA - no residual    Past Surgical History:  Procedure Laterality Date  . ABDOMINAL HYSTERECTOMY    . ANKLE ARTHODESIS W/ ARTHROSCOPY Left   . CATARACT EXTRACTION Bilateral   . CHOLECYSTECTOMY      Current Medications: Current Meds  Medication Sig  . aspirin EC 81 MG tablet Take 81 mg by mouth daily.  Marland Kitchen desvenlafaxine (PRISTIQ) 50 MG 24 hr tablet Take 50 mg by mouth daily.  . diphenhydrAMINE (BENADRYL) 25 MG tablet Take 25  mg by mouth at bedtime as needed for allergies.  Marland Kitchen donepezil (ARICEPT) 10 MG tablet Take 1 tablet (10 mg total) by mouth daily.  . FENOFIBRATE PO Take 1 tablet by mouth daily.  Marland Kitchen HYDROcodone-acetaminophen (NORCO) 5-325 MG tablet Take 1 tablet by mouth every 4 (four) hours as needed for severe pain.  Marland Kitchen ibuprofen (ADVIL,MOTRIN) 200 MG tablet Take 200 mg by mouth every 6 (six) hours as needed for headache.  . insulin lispro protamine-lispro (HUMALOG 50/50 MIX) (50-50) 100 UNIT/ML SUSP injection Inject 0.3-0.45 mLs (30-45 Units total) into the skin 2 (two) times daily before a meal. Inject 30 units every morning and 45 units every night (Patient taking differently: Inject 50 Units into the skin 2 (two) times daily. )  . losartan (COZAAR) 50 MG tablet Take 50 mg by mouth daily.  . nebivolol (BYSTOLIC) 10 MG tablet Take 0.5 tablets (5 mg total) by mouth daily.  . sodium chloride (OCEAN) 0.65 % SOLN nasal spray Place 1 spray into both nostrils as needed for congestion.  Marland Kitchen venlafaxine (EFFEXOR) 37.5 MG tablet Take 2 tablets at night     Allergies:   Patient has no known allergies.   Social History   Socioeconomic History  . Marital status: Married  Spouse name: Not on file  . Number of children: Not on file  . Years of education: Not on file  . Highest education level: Not on file  Occupational History  . Not on file  Social Needs  . Financial resource strain: Not on file  . Food insecurity:    Worry: Not on file    Inability: Not on file  . Transportation needs:    Medical: Not on file    Non-medical: Not on file  Tobacco Use  . Smoking status: Former Smoker    Packs/day: 1.00    Years: 5.00    Pack years: 5.00    Types: Cigarettes  . Smokeless tobacco: Never Used  . Tobacco comment: 06/22/17- "quit at least 40 years ago"  Substance and Sexual Activity  . Alcohol use: No    Alcohol/week: 0.0 oz  . Drug use: No  . Sexual activity: Never  Lifestyle  . Physical activity:    Days  per week: Not on file    Minutes per session: Not on file  . Stress: Not on file  Relationships  . Social connections:    Talks on phone: Not on file    Gets together: Not on file    Attends religious service: Not on file    Active member of club or organization: Not on file    Attends meetings of clubs or organizations: Not on file    Relationship status: Not on file  Other Topics Concern  . Not on file  Social History Narrative  . Not on file     Family History: The patient's family history includes Diabetes Mellitus II in her father; Hypertension in her mother; Prostate cancer in her father. ROS:   Please see the history of present illness.    All 14 point review of systems negative except as described per history of present illness.  EKGs/Labs/Other Studies Reviewed:    The following studies were reviewed today: Carotid ultrasound from 2014 did not show any critical stenosis.  Echocardiogram also worse unrevealing.  EKG:  EKG is  ordered today.  The ekg ordered today demonstrates normal sinus rhythm left axis deviation left ventricle hypertrophy poor R wave progression anterior precordium raising suspicion for anterior septal wall MI  Recent Labs: 01/28/2018: BUN 37; Creatinine, Ser 2.06; Hemoglobin 11.7; Platelets 184; Potassium 4.9; Sodium 132  Recent Lipid Panel    Component Value Date/Time   CHOL 237 (H) 07/27/2013 0553   TRIG 191 (H) 07/27/2013 0553   HDL 44 07/27/2013 0553   CHOLHDL 5.4 07/27/2013 0553   VLDL 38 07/27/2013 0553   LDLCALC 155 (H) 07/27/2013 0553    Physical Exam:    VS:  BP (!) 110/56   Pulse 67   Ht 5\' 5"  (1.651 m)   Wt 199 lb 12.8 oz (90.6 kg)   SpO2 97%   BMI 33.25 kg/m     Wt Readings from Last 3 Encounters:  02/07/18 199 lb 12.8 oz (90.6 kg)  01/28/18 200 lb (90.7 kg)  03/01/17 220 lb (99.8 kg)     GEN:  Well nourished, well developed in no acute distress HEENT: Normal NECK: No JVD; No carotid bruits LYMPHATICS: No  lymphadenopathy CARDIAC: RRR, no murmurs, no rubs, no gallops RESPIRATORY:  Clear to auscultation without rales, wheezing or rhonchi  ABDOMEN: Soft, non-tender, non-distended MUSCULOSKELETAL:  No edema; No deformity  SKIN: Warm and dry NEUROLOGIC:  Alert and oriented x 3 PSYCHIATRIC:  Normal affect   ASSESSMENT:  1. Precordial pain   2. Essential hypertension   3. Type 2 diabetes mellitus with complication, with long-term current use of insulin ([place])   4. Lacunar stroke ([place])   5. Mild cognitive impairment    PLAN:    In order of problems listed above:  1. Precordial pain.  She denies having any. 2. Preop evaluation for this lady with fracture of the right wrist.  She has multiple risk factors for coronary artery disease she does have hypertension diabetes dyslipidemia.  We need to assess her heart before surgery.  I will schedule her to have echocardiogram.  Also Lexiscan will be ordered.  We will try to proceed as quickly as possible.  I will try to get in touch with surgery and see if there is an alternative to this management because if there is none the only changes in the management that we will have if her stress test is abnormal will be keeping her in the hospital longer and watch her more carefully. 3. Lacunar stroke: Old finding. 4. Mild cognitive impairment.  I still has regular conversation with her and she is doing quite well 5. Dyslipidemia I do not see any cholesterol on medication on her list.  Her last LDL was 155.  She needs to be on statin I would favor atorvastatin 20 mg daily for her.   Medication Adjustments/Labs and Tests Ordered: Current medicines are reviewed at length with the patient today.  Concerns regarding medicines are outlined above.  No orders of the defined types were placed in this encounter.  No orders of the defined types were placed in this encounter.   Signed, Park Liter, MD, [facility]. 02/07/2018 4:17 PM    [facility]

## 2018-02-08 ENCOUNTER — Telehealth (HOSPITAL_COMMUNITY): Payer: Self-pay

## 2018-02-08 NOTE — Telephone Encounter (Signed)
Encounter complete. 

## 2018-02-09 ENCOUNTER — Telehealth: Payer: Self-pay | Admitting: *Deleted

## 2018-02-09 ENCOUNTER — Telehealth (HOSPITAL_COMMUNITY): Payer: Self-pay

## 2018-02-09 NOTE — Telephone Encounter (Signed)
Called and left a message for Dr. Brennan Bailey office regarding patient's upcoming surgery.   Almyra Free returned call and directed me to Threasa Beards, NP who provided needed information regarding surgery. Patient needs pinning and placement of the right wrist due to fracture. If patient does not have this done, she will have a deformity to right wrist and it is possible she will not be able to use her right hand. Patient will require 1-1.5 hours of general anesthesia.   Informed Melanie, NP that patient is going for testing needed before Dr. Agustin Cree will clear the patient for surgery tomorrow, 02/10/18. She is scheduled for an echocardiogram and lexiscan. Advised her that when Dr. Agustin Cree determines whether or not patient is cleared for surgery, a letter of notification will be sent to them via fax. Fax number provided. No further questions.

## 2018-02-09 NOTE — Telephone Encounter (Signed)
Contacted patient to inform her of scheduled testing yesterday morning. Patient verbalized she wanted me to call and discuss these details with her son, Eddie Dibbles, per [facility]. Contacted Eddie Dibbles who states that he was driving and he would return my call. Did mention to him at that time that patient should not take her nebivolol (bystolic) before lexiscan on Friday. This was reviewed with the rest of the lexiscan instructions during patient's office visit on Tuesday, 02/07/18.  Eddie Dibbles never called me back yesterday, so I called to follow up with him this morning. Reviewed lexiscan instructions with him regarding medications. Reminded him that the patient needed to hold her nebivolol (bystolic) for 24 hours before testing. Eddie Dibbles expressed frustration saying that he spoke with Estill Bamberg, RN at [employer] who instructed him that the patient could take all medications normally. Educated him on the reason the patient should not take nebivolol 24 hours before testing. Eddie Dibbles finally agreed and states he "will call his mom now to let her know."   Reached out to [place], [place] to clarify with her regarding the conversation.

## 2018-02-10 ENCOUNTER — Ambulatory Visit (HOSPITAL_COMMUNITY)
Admission: RE | Admit: 2018-02-10 | Discharge: 2018-02-10 | Disposition: A | Discharge status: Home / Self Care | Payer: Medicare Other | Source: Ambulatory Visit | Attending: Cardiovascular Disease | Admitting: Cardiovascular Disease

## 2018-02-10 ENCOUNTER — Ambulatory Visit (HOSPITAL_BASED_OUTPATIENT_CLINIC_OR_DEPARTMENT_OTHER): Payer: Medicare Other

## 2018-02-10 ENCOUNTER — Other Ambulatory Visit: Payer: Self-pay

## 2018-02-10 DIAGNOSIS — E785 Hyperlipidemia, unspecified: Secondary | ICD-10-CM | POA: Insufficient documentation

## 2018-02-10 DIAGNOSIS — R072 Precordial pain: Secondary | ICD-10-CM

## 2018-02-10 DIAGNOSIS — I10 Essential (primary) hypertension: Secondary | ICD-10-CM

## 2018-02-10 DIAGNOSIS — I509 Heart failure, unspecified: Secondary | ICD-10-CM | POA: Diagnosis not present

## 2018-02-10 DIAGNOSIS — I129 Hypertensive chronic kidney disease with stage 1 through stage 4 chronic kidney disease, or unspecified chronic kidney disease: Secondary | ICD-10-CM | POA: Diagnosis not present

## 2018-02-10 DIAGNOSIS — Z8673 Personal history of transient ischemic attack (TIA), and cerebral infarction without residual deficits: Secondary | ICD-10-CM | POA: Insufficient documentation

## 2018-02-10 DIAGNOSIS — E1122 Type 2 diabetes mellitus with diabetic chronic kidney disease: Secondary | ICD-10-CM | POA: Diagnosis not present

## 2018-02-10 DIAGNOSIS — I253 Aneurysm of heart: Secondary | ICD-10-CM | POA: Insufficient documentation

## 2018-02-10 DIAGNOSIS — N189 Chronic kidney disease, unspecified: Secondary | ICD-10-CM | POA: Insufficient documentation

## 2018-02-10 LAB — MYOCARDIAL PERFUSION IMAGING
CHL CUP NUCLEAR SDS: 3
CHL CUP RESTING HR STRESS: 57 {beats}/min
LVDIAVOL: 83 mL (ref 46–106)
LVSYSVOL: 36 mL
Peak HR: 85 {beats}/min
SRS: 2
SSS: 5

## 2018-02-10 MED ORDER — REGADENOSON 0.4 MG/5ML IV SOLN
0.4000 mg | Freq: Once | INTRAVENOUS | Status: AC
  Administered 2018-02-10: 0.4 mg via INTRAVENOUS

## 2018-02-10 MED ORDER — TECHNETIUM TC 99M TETROFOSMIN IV KIT
10.2000 | PACK | Freq: Once | INTRAVENOUS | Status: AC | PRN
  Administered 2018-02-10: 10.2 via INTRAVENOUS
  Filled 2018-02-10: qty 11

## 2018-02-10 MED ORDER — TECHNETIUM TC 99M TETROFOSMIN IV KIT
29.6000 | PACK | Freq: Once | INTRAVENOUS | Status: AC | PRN
  Administered 2018-02-10: 29.6 via INTRAVENOUS
  Filled 2018-02-10: qty 30

## 2018-02-14 ENCOUNTER — Telehealth: Payer: Self-pay | Admitting: Cardiology

## 2018-02-14 ENCOUNTER — Encounter: Payer: Self-pay | Admitting: *Deleted

## 2018-02-14 NOTE — Telephone Encounter (Signed)
Patient's son, Tamara Fry, per [facility] informed of results and advised that Dr. Agustin Cree has cleared the patient for surgery from a cardiac standpoint. Informed him we would fax a letter to Dr. Brennan Bailey office informing them of this so they could move forward with scheduling patient's surgery. Tamara Fry verbalized understanding. No further questions.

## 2018-02-14 NOTE — Telephone Encounter (Signed)
Please call back with patient lab results

## 2018-02-15 NOTE — Telephone Encounter (Signed)
complete

## 2018-02-21 DIAGNOSIS — S52531A Colles' fracture of right radius, initial encounter for closed fracture: Secondary | ICD-10-CM | POA: Diagnosis not present

## 2018-02-21 DIAGNOSIS — S52531B Colles' fracture of right radius, initial encounter for open fracture type I or II: Secondary | ICD-10-CM | POA: Diagnosis not present

## 2018-02-21 DIAGNOSIS — S52571C Other intraarticular fracture of lower end of right radius, initial encounter for open fracture type IIIA, IIIB, or IIIC: Secondary | ICD-10-CM | POA: Diagnosis not present

## 2018-03-09 ENCOUNTER — Encounter: Payer: Self-pay | Admitting: Cardiology

## 2018-03-09 ENCOUNTER — Ambulatory Visit: Payer: Medicare Other | Admitting: Cardiology

## 2018-03-09 VITALS — BP 124/84 | HR 71 | Ht 65.0 in | Wt 197.8 lb

## 2018-03-09 DIAGNOSIS — E118 Type 2 diabetes mellitus with unspecified complications: Secondary | ICD-10-CM

## 2018-03-09 DIAGNOSIS — R072 Precordial pain: Secondary | ICD-10-CM

## 2018-03-09 DIAGNOSIS — E782 Mixed hyperlipidemia: Secondary | ICD-10-CM | POA: Diagnosis not present

## 2018-03-09 DIAGNOSIS — I10 Essential (primary) hypertension: Secondary | ICD-10-CM

## 2018-03-09 DIAGNOSIS — Z794 Long term (current) use of insulin: Secondary | ICD-10-CM | POA: Diagnosis not present

## 2018-03-09 NOTE — Patient Instructions (Signed)
Medication Instructions:  Your physician recommends that you continue on your current medications as directed. Please refer to the Current Medication list given to you today.   Labwork: Your physician recommends that you return for lab work today: Lipids, and hepatic function panel.   Testing/Procedures: None.  Follow-Up: Your physician wants you to follow-up in: 4 months. You will receive a reminder letter in the mail two months in advance. If you don't receive a letter, please call our office to schedule the follow-up appointment.  Any Other Special Instructions Will Be Listed Below (If Applicable).       If you need a refill on your cardiac medications before your next appointment, please call your pharmacy.

## 2018-03-09 NOTE — Progress Notes (Signed)
Cardiology Office Note:    Date:  03/09/2018   ID:  Tamara Fry, DOB October 17, 1945, MRN [id number]  PCP:  Tamara Gravel, MD  Cardiologist:  Tamara Campus, MD    Referring MD: Tamara Gravel, MD   Chief Complaint  Patient presents with  . 1 month follow up  Doing well  History of Present Illness:    Tamara Fry is a 72 y.o. female with diabetes hypertension atypical chest pain she was sent to [place] for evaluation before wrist surgery.  That evaluation included stress test which was negative as well as echocardiogram that showed normal left ventricular ejection fraction she does have mild aortic enlargement.  Went to surgery with no difficulties doing well.  Denies have any chest pain tightness squeezing pressure burning chest  Past Medical History:  Diagnosis Date  . Chronic kidney disease    Stage 4  . Depression   . Diabetes mellitus    Type I per office notes  . Hyperlipidemia   . Hypertension   . Memory loss    "short term"  . Migraine   . Stroke [facility])    TIA - no residual    Past Surgical History:  Procedure Laterality Date  . ABDOMINAL HYSTERECTOMY    . ANKLE ARTHODESIS W/ ARTHROSCOPY Left   . CATARACT EXTRACTION Bilateral   . CHOLECYSTECTOMY    . WRIST FRACTURE SURGERY      Current Medications: Current Meds  Medication Sig  . aspirin EC 81 MG tablet Take 81 mg by mouth daily.  Marland Kitchen desvenlafaxine (PRISTIQ) 50 MG 24 hr tablet Take 50 mg by mouth daily.  . diphenhydrAMINE (BENADRYL) 25 MG tablet Take 25 mg by mouth at bedtime as needed for allergies.  Marland Kitchen donepezil (ARICEPT) 10 MG tablet Take 1 tablet (10 mg total) by mouth daily.  . FENOFIBRATE PO Take 1 tablet by mouth daily.  Marland Kitchen ibuprofen (ADVIL,MOTRIN) 200 MG tablet Take 200 mg by mouth every 6 (six) hours as needed for headache.  . insulin lispro protamine-lispro (HUMALOG 50/50 MIX) (50-50) 100 UNIT/ML SUSP injection Inject 0.3-0.45 mLs (30-45 Units total) into the skin 2 (two) times daily before a meal. Inject 30 units  every morning and 45 units every night (Patient taking differently: Inject 50 Units into the skin 2 (two) times daily. )  . losartan (COZAAR) 50 MG tablet Take 50 mg by mouth daily.  . nebivolol (BYSTOLIC) 10 MG tablet Take 0.5 tablets (5 mg total) by mouth daily.  . sodium chloride (OCEAN) 0.65 % SOLN nasal spray Place 1 spray into both nostrils as needed for congestion.  Marland Kitchen venlafaxine (EFFEXOR) 37.5 MG tablet Take 2 tablets at night     Allergies:   Patient has no known allergies.   Social History   Socioeconomic History  . Marital status: Married    Spouse name: Not on file  . Number of children: Not on file  . Years of education: Not on file  . Highest education level: Not on file  Occupational History  . Not on file  Social Needs  . Financial resource strain: Not on file  . Food insecurity:    Worry: Not on file    Inability: Not on file  . Transportation needs:    Medical: Not on file    Non-medical: Not on file  Tobacco Use  . Smoking status: Former Smoker    Packs/day: 1.00    Years: 5.00    Pack years: 5.00    Types:  Cigarettes  . Smokeless tobacco: Never Used  . Tobacco comment: 06/22/17- "quit at least 40 years ago"  Substance and Sexual Activity  . Alcohol use: No    Alcohol/week: 0.0 oz  . Drug use: No  . Sexual activity: Never  Lifestyle  . Physical activity:    Days per week: Not on file    Minutes per session: Not on file  . Stress: Not on file  Relationships  . Social connections:    Talks on phone: Not on file    Gets together: Not on file    Attends religious service: Not on file    Active member of club or organization: Not on file    Attends meetings of clubs or organizations: Not on file    Relationship status: Not on file  Other Topics Concern  . Not on file  Social History Narrative  . Not on file     Family History: The patient's family history includes Diabetes Mellitus II in her father; Hypertension in her mother; Prostate cancer  in her father. ROS:   Please see the history of present illness.    All 14 point review of systems negative except as described per history of present illness  EKGs/Labs/Other Studies Reviewed:     Echocardiogram from 02/10/2018 - Left ventricle: The cavity size was normal. Systolic function was   normal. The estimated ejection fraction was in the range of 60%   to 65%. Wall motion was normal; there were no regional wall   motion abnormalities. Doppler parameters are consistent with   abnormal left ventricular relaxation (grade 1 diastolic   dysfunction). - Aortic valve: Transvalvular velocity was within the normal range.   There was no stenosis. There was no regurgitation. - Aorta: Ascending aortic diameter: 39 mm (S). - Ascending aorta: The ascending aorta was mildly dilated. - Mitral valve: Transvalvular velocity was within the normal range.   There was no evidence for stenosis. There was no regurgitation. - Right ventricle: Systolic function was normal. - Atrial septum: No defect or patent foramen ovale was identified.   There was an atrial septal aneurysm. - Tricuspid valve: There was no regurgitation.  Recent Labs: 01/28/2018: BUN 37; Creatinine, Ser 2.06; Hemoglobin 11.7; Platelets 184; Potassium 4.9; Sodium 132  Recent Lipid Panel    Component Value Date/Time   CHOL 237 (H) 07/27/2013 0553   TRIG 191 (H) 07/27/2013 0553   HDL 44 07/27/2013 0553   CHOLHDL 5.4 07/27/2013 0553   VLDL 38 07/27/2013 0553   LDLCALC 155 (H) 07/27/2013 0553    Physical Exam:    VS:  BP 124/84   Pulse 71   Ht 5\' 5"  (1.651 m)   Wt 197 lb 12.8 oz (89.7 kg)   SpO2 97%   BMI 32.92 kg/m     Wt Readings from Last 3 Encounters:  03/09/18 197 lb 12.8 oz (89.7 kg)  02/10/18 199 lb (90.3 kg)  02/07/18 199 lb 12.8 oz (90.6 kg)     GEN:  Well nourished, well developed in no acute distress HEENT: Normal NECK: No JVD; No carotid bruits LYMPHATICS: No lymphadenopathy CARDIAC: RRR, no murmurs,  no rubs, no gallops RESPIRATORY:  Clear to auscultation without rales, wheezing or rhonchi  ABDOMEN: Soft, non-tender, non-distended MUSCULOSKELETAL:  No edema; No deformity  SKIN: Warm and dry LOWER EXTREMITIES: no swelling NEUROLOGIC:  Alert and oriented x 3 PSYCHIATRIC:  Normal affect   ASSESSMENT:    1. Essential hypertension   2. Type 2  diabetes mellitus with complication, with long-term current use of insulin ([place])   3. Mixed hyperlipidemia   4. Precordial pain    PLAN:    In order of problems listed above:  1. Essential hypertension blood pressure well controlled continue present management. 2. Type 2 diabetes followed by internal medicine team doing well 3. Dyslipidemia see last fasting lipid profile from 2014 that was LDL 155 in my opinion she need to be taking cholesterol medication we called pharmacy to verify make sure she does not take any cholesterol medication if not we will check a fasting lipid profile today and I will put her on appropriate medications. 4. Precordial chest pain denies having any.  See her back in my office in about 3 to 4 months sooner if she get a problem   Medication Adjustments/Labs and Tests Ordered: Current medicines are reviewed at length with the patient today.  Concerns regarding medicines are outlined above.  No orders of the defined types were placed in this encounter.  Medication changes: No orders of the defined types were placed in this encounter.   Signed, Park Liter, MD, [facility] 03/09/2018 4:25 PM    [place]

## 2018-03-10 ENCOUNTER — Telehealth: Payer: Self-pay | Admitting: Emergency Medicine

## 2018-03-10 LAB — LIPID PANEL
CHOLESTEROL TOTAL: 216 mg/dL — AB (ref 100–199)
Chol/HDL Ratio: 3.9 ratio (ref 0.0–4.4)
HDL: 56 mg/dL (ref >39)
LDL Calculated: 116 mg/dL — ABNORMAL HIGH (ref 0–99)
TRIGLYCERIDES: 222 mg/dL — AB (ref 0–149)
VLDL Cholesterol Cal: 44 mg/dL — ABNORMAL HIGH (ref 5–40)

## 2018-03-10 LAB — HEPATIC FUNCTION PANEL
ALBUMIN: 4 g/dL (ref 3.5–4.8)
ALK PHOS: 132 IU/L — AB (ref 39–117)
ALT: 10 IU/L (ref 0–32)
AST: 11 IU/L (ref 0–40)
Bilirubin Total: 0.3 mg/dL (ref 0.0–1.2)
Bilirubin, Direct: 0.13 mg/dL (ref 0.00–0.40)
Total Protein: 6.8 g/dL (ref 6.0–8.5)

## 2018-03-10 MED ORDER — ATORVASTATIN CALCIUM 20 MG PO TABS: 20.0000 mg | ORAL_TABLET | Freq: Every day | ORAL | 0 refills | Status: DC

## 2018-03-10 NOTE — Telephone Encounter (Signed)
Informed patient of lab results. Informed patient that Dr. Agustin Cree has advised her to start Lipitor 20 mg daily. Medication sent to patient preferred pharmacy. Patient verbally understands.

## 2018-03-22 DIAGNOSIS — S52531D Colles' fracture of right radius, subsequent encounter for closed fracture with routine healing: Secondary | ICD-10-CM | POA: Diagnosis not present

## 2018-03-28 DIAGNOSIS — Z0000 Encounter for general adult medical examination without abnormal findings: Secondary | ICD-10-CM | POA: Diagnosis not present

## 2018-03-28 DIAGNOSIS — E785 Hyperlipidemia, unspecified: Secondary | ICD-10-CM | POA: Diagnosis not present

## 2018-03-28 DIAGNOSIS — I10 Essential (primary) hypertension: Secondary | ICD-10-CM | POA: Diagnosis not present

## 2018-03-28 DIAGNOSIS — Z780 Asymptomatic menopausal state: Secondary | ICD-10-CM | POA: Diagnosis not present

## 2018-03-28 DIAGNOSIS — E118 Type 2 diabetes mellitus with unspecified complications: Secondary | ICD-10-CM | POA: Diagnosis not present

## 2018-03-28 DIAGNOSIS — E789 Disorder of lipoprotein metabolism, unspecified: Secondary | ICD-10-CM | POA: Diagnosis not present

## 2018-03-28 DIAGNOSIS — E119 Type 2 diabetes mellitus without complications: Secondary | ICD-10-CM | POA: Diagnosis not present

## 2018-05-04 ENCOUNTER — Ambulatory Visit: Payer: Medicare Other | Admitting: Podiatry

## 2018-05-19 ENCOUNTER — Other Ambulatory Visit: Payer: Self-pay

## 2018-05-19 ENCOUNTER — Emergency Department (HOSPITAL_COMMUNITY)
Admission: EM | Admit: 2018-05-19 | Discharge: 2018-05-19 | Disposition: A | Discharge status: Home / Self Care | Payer: Medicare Other | Attending: Emergency Medicine | Admitting: Emergency Medicine

## 2018-05-19 ENCOUNTER — Encounter (HOSPITAL_COMMUNITY): Payer: Self-pay | Admitting: Emergency Medicine

## 2018-05-19 DIAGNOSIS — F0390 Unspecified dementia without behavioral disturbance: Secondary | ICD-10-CM | POA: Insufficient documentation

## 2018-05-19 DIAGNOSIS — K920 Hematemesis: Secondary | ICD-10-CM | POA: Insufficient documentation

## 2018-05-19 DIAGNOSIS — Z79899 Other long term (current) drug therapy: Secondary | ICD-10-CM | POA: Insufficient documentation

## 2018-05-19 DIAGNOSIS — E785 Hyperlipidemia, unspecified: Secondary | ICD-10-CM | POA: Diagnosis not present

## 2018-05-19 DIAGNOSIS — E11649 Type 2 diabetes mellitus with hypoglycemia without coma: Secondary | ICD-10-CM | POA: Diagnosis not present

## 2018-05-19 DIAGNOSIS — Z87891 Personal history of nicotine dependence: Secondary | ICD-10-CM | POA: Diagnosis not present

## 2018-05-19 DIAGNOSIS — Z8673 Personal history of transient ischemic attack (TIA), and cerebral infarction without residual deficits: Secondary | ICD-10-CM | POA: Diagnosis not present

## 2018-05-19 DIAGNOSIS — R110 Nausea: Secondary | ICD-10-CM | POA: Insufficient documentation

## 2018-05-19 DIAGNOSIS — E10649 Type 1 diabetes mellitus with hypoglycemia without coma: Secondary | ICD-10-CM | POA: Insufficient documentation

## 2018-05-19 DIAGNOSIS — N184 Chronic kidney disease, stage 4 (severe): Secondary | ICD-10-CM | POA: Diagnosis not present

## 2018-05-19 DIAGNOSIS — Z7982 Long term (current) use of aspirin: Secondary | ICD-10-CM | POA: Diagnosis not present

## 2018-05-19 DIAGNOSIS — I129 Hypertensive chronic kidney disease with stage 1 through stage 4 chronic kidney disease, or unspecified chronic kidney disease: Secondary | ICD-10-CM | POA: Diagnosis not present

## 2018-05-19 DIAGNOSIS — Z794 Long term (current) use of insulin: Secondary | ICD-10-CM | POA: Diagnosis not present

## 2018-05-19 DIAGNOSIS — E162 Hypoglycemia, unspecified: Secondary | ICD-10-CM

## 2018-05-19 LAB — URINALYSIS, ROUTINE W REFLEX MICROSCOPIC
Bilirubin Urine: NEGATIVE
GLUCOSE, UA: 50 mg/dL — AB
Ketones, ur: NEGATIVE mg/dL
Nitrite: NEGATIVE
Protein, ur: NEGATIVE mg/dL
SPECIFIC GRAVITY, URINE: 1.017 (ref 1.005–1.030)
WBC, UA: >50 WBC/hpf — ABNORMAL HIGH (ref 0–5)
pH: 5 (ref 5.0–8.0)

## 2018-05-19 LAB — COMPREHENSIVE METABOLIC PANEL
ALT: 93 U/L — AB (ref 0–44)
AST: 189 U/L — AB (ref 15–41)
Albumin: 3.7 g/dL (ref 3.5–5.0)
Alkaline Phosphatase: 81 U/L (ref 38–126)
Anion gap: 9 (ref 5–15)
BUN: 30 mg/dL — ABNORMAL HIGH (ref 8–23)
CHLORIDE: 105 mmol/L (ref 98–111)
CO2: 25 mmol/L (ref 22–32)
CREATININE: 1.72 mg/dL — AB (ref 0.44–1.00)
Calcium: 10 mg/dL (ref 8.9–10.3)
GFR calc Af Amer: 33 mL/min — ABNORMAL LOW (ref >60)
GFR calc non Af Amer: 29 mL/min — ABNORMAL LOW (ref >60)
Glucose, Bld: 108 mg/dL — ABNORMAL HIGH (ref 70–99)
Potassium: 3.9 mmol/L (ref 3.5–5.1)
SODIUM: 139 mmol/L (ref 135–145)
Total Bilirubin: 0.9 mg/dL (ref 0.3–1.2)
Total Protein: 6.9 g/dL (ref 6.5–8.1)

## 2018-05-19 LAB — CBG MONITORING, ED
GLUCOSE-CAPILLARY: 142 mg/dL — AB (ref 70–99)
Glucose-Capillary: 34 mg/dL — CL (ref 70–99)
Glucose-Capillary: 40 mg/dL — CL (ref 70–99)
Glucose-Capillary: 47 mg/dL — ABNORMAL LOW (ref 70–99)
Glucose-Capillary: 93 mg/dL (ref 70–99)

## 2018-05-19 LAB — CBC
HEMATOCRIT: 41.4 % (ref 36.0–46.0)
Hemoglobin: 13.1 g/dL (ref 12.0–15.0)
MCH: 29 pg (ref 26.0–34.0)
MCHC: 31.6 g/dL (ref 30.0–36.0)
MCV: 91.6 fL (ref 78.0–100.0)
Platelets: 232 10*3/uL (ref 150–400)
RBC: 4.52 MIL/uL (ref 3.87–5.11)
RDW: 12.8 % (ref 11.5–15.5)
WBC: 10.7 10*3/uL — ABNORMAL HIGH (ref 4.0–10.5)

## 2018-05-19 LAB — ABO/RH: ABO/RH(D): O POS

## 2018-05-19 LAB — TYPE AND SCREEN
ABO/RH(D): O POS
Antibody Screen: NEGATIVE

## 2018-05-19 LAB — POC OCCULT BLOOD, ED: Fecal Occult Bld: NEGATIVE

## 2018-05-19 MED ORDER — GLUCOSE 40 % PO GEL
1.0000 | Freq: Once | ORAL | Status: AC
  Administered 2018-05-19: 37.5 g via ORAL
  Filled 2018-05-19: qty 1

## 2018-05-19 MED ORDER — DEXTROSE 50 % IV SOLN
1.0000 | Freq: Once | INTRAVENOUS | Status: AC
  Administered 2018-05-19: 50 mL via INTRAVENOUS
  Filled 2018-05-19: qty 50

## 2018-05-19 NOTE — ED Notes (Signed)
Pt stable, ambulatory, states understanding of discharge instructions 

## 2018-05-19 NOTE — ED Notes (Signed)
Pt CBG 93. 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

## 2018-05-19 NOTE — Discharge Instructions (Addendum)
Decrease insulin to 30U in the morning and 45U at night.  Eat breakfast lunch and dinner.  Follow up with primary care doctor in 2 days.  Return to the ED for any worsening or other concerns.

## 2018-05-19 NOTE — ED Notes (Signed)
This RN made aware of patient's CBG level.  Brought to triage and provided OJ and graham crackers.  Patient alert and oriented, skin pale and moist.  EMT monitoring and will move to room when available

## 2018-05-19 NOTE — ED Provider Notes (Signed)
Patient reports that she vomited a drinking glass full blood this morning.  She denies nausea present.  She denies abdominal pain last bowel movement yesterday, normal she is not nauseated presently.  She states she ate toast and jelly this morning took her usual dose of insulin.  Her son reports that.  Presently asymptomatic on exam alert no distress lungs clear to auscultation heart regular rate and rhythm abdomen nondistended nontender rectum normal tone brown stool no gross blood.  NG tube was inserted by the nurse which showed clear yellowish material, no blood.  NG tube was pulled.   Orlie Dakin, MD 05/20/18 [phone]

## 2018-05-19 NOTE — ED Triage Notes (Signed)
Onset today patient felt nausea and stated emesis large amount of bright red blood and dizziness. States last bowel movement one day ago normal. Alert answering and following commands appropriate. Denies pain.

## 2018-05-19 NOTE — ED Notes (Signed)
RN made aware of CBG=47

## 2018-05-19 NOTE — ED Provider Notes (Signed)
[place] EMERGENCY DEPARTMENT Provider Note   CSN: [id number] Arrival date & time: 05/19/18  1043     History   Chief Complaint Chief Complaint  Patient presents with  . Hematemesis  . Dizziness    HPI Tamara Fry is a 72 y.o. female.  HPI  Patient is a 72yo female with PMHx of CKD IV, DM, HTN, dementia, CVA, and recent R wrist fx s/p repair a couple of weeks ago who presents with hematemesis today x one episode.  Unable to describe the amount however states that it was small.  She adamantly denies any hemoptysis as she was nauseous beforehand.  On arrival she was noted to be hypoglycemia to 30s.  Oral glucose given without improvement.  She took 50U of insulin this morning which she states she cut back as "she is supposed to take 70U."  She is currently asymptomatic.  She denies abdominal pain, hematuria, melena, BRBPR, chest pain, shortness of breath, fevers, and all other complaints.  No anticoagulation.  No falls or trauma.  No prior history of GI bleed.  DM medications - humalog 50-50 (instructions to take 30U AM, 45U PM) however is taking 50U BID.      Past Medical History:  Diagnosis Date  . Chronic kidney disease    Stage 4  . Depression   . Diabetes mellitus    Type I per office notes  . Hyperlipidemia   . Hypertension   . Memory loss    "short term"  . Migraine   . Stroke [facility])    TIA - no residual    Patient Active Problem List   Diagnosis Date Noted  . Essential hypertension 06/20/2015  . Mild cognitive impairment 12/18/2014  . Migraine without aura and without status migrainosus, not intractable 12/18/2014  . Benign paroxysmal positional vertigo 12/18/2014  . Vertigo 06/16/2014  . Migraine, unspecified, without mention of intractable migraine without mention of status migrainosus 04/20/2014  . Lacunar stroke ([place]) 01/03/2014  . Chest pain 07/26/2013  . Headache 07/26/2013  . Diabetes mellitus ([place]) 07/26/2013  . CVA (cerebral  infarction) 07/26/2013  . TIA (transient ischemic attack) 07/25/2013  . Diabetes ([place]) 04/07/2010  . Hyperlipidemia 04/07/2010  . OBESITY 04/07/2010  . CHRONIC PAIN SYNDROME 04/07/2010  . Hypertension 04/07/2010  . ARTHRITIS, CHRONIC 04/07/2010  . CHEST PAIN 04/07/2010  . MIGRAINES, HX OF 04/07/2010    Past Surgical History:  Procedure Laterality Date  . ABDOMINAL HYSTERECTOMY    . ANKLE ARTHODESIS W/ ARTHROSCOPY Left   . CATARACT EXTRACTION Bilateral   . CHOLECYSTECTOMY    . WRIST FRACTURE SURGERY       OB History   None      Home Medications    Prior to Admission medications   Medication Sig Start Date End Date Taking? Authorizing Provider  aspirin EC 81 MG tablet Take 81 mg by mouth at bedtime.    Yes [provider]  atorvastatin (LIPITOR) 20 MG tablet Take 1 tablet (20 mg total) by mouth daily. Patient taking differently: Take 20 mg by mouth at bedtime.  03/10/18 06/08/18 Yes Park Liter, MD  desvenlafaxine (PRISTIQ) 50 MG 24 hr tablet Take 50 mg by mouth at bedtime.    Yes [provider]  diphenhydrAMINE (BENADRYL) 25 MG tablet Take 25 mg by mouth at bedtime as needed (seasonal allergies).    Yes [provider]  donepezil (ARICEPT) 10 MG tablet Take 1 tablet (10 mg total) by mouth daily.  Patient taking differently: Take 10 mg by mouth at bedtime.  06/20/15  Yes Cameron Sprang, MD  fenofibrate (TRICOR) 48 MG tablet Take 48 mg by mouth at bedtime.   Yes [provider]  ibuprofen (ADVIL,MOTRIN) 200 MG tablet Take 200 mg by mouth at bedtime as needed for headache (pain).    Yes [provider]  insulin lispro protamine-lispro (HUMALOG 50/50 MIX) (50-50) 100 UNIT/ML SUSP injection Inject 0.3-0.45 mLs (30-45 Units total) into the skin 2 (two) times daily before a meal. Inject 30 units every morning and 45 units every night Patient taking differently: Inject 50 Units into the skin daily before supper.  06/17/14  Yes Hosie Poisson, MD  losartan (COZAAR) 50 MG tablet Take 50 mg by mouth at bedtime.    Yes [provider]  nebivolol (BYSTOLIC) 10 MG tablet Take 0.5 tablets (5 mg total) by mouth daily. Patient taking differently: Take 10 mg by mouth at bedtime.  06/17/14  Yes Hosie Poisson, MD  venlafaxine [facility]) 37.5 MG tablet Take 2 tablets at night Patient not taking: Reported on 05/19/2018 06/20/15   Cameron Sprang, MD    Family History Family History  Problem Relation Age of Onset  . Hypertension Mother   . Diabetes Mellitus II Father   . Prostate cancer Father     Social History Social History   Tobacco Use  . Smoking status: Former Smoker    Packs/day: 1.00    Years: 5.00    Pack years: 5.00    Types: Cigarettes  . Smokeless tobacco: Never Used  . Tobacco comment: 06/22/17- "quit at least 40 years ago"  Substance Use Topics  . Alcohol use: No    Alcohol/week: 0.0 standard drinks  . Drug use: No     Allergies   Patient has no known allergies.   Review of Systems Review of Systems  Constitutional: Negative for chills and fever.  HENT: Negative for rhinorrhea and sore throat.   Eyes: Negative for pain and visual disturbance.  Respiratory: Negative for cough and shortness of breath.   Cardiovascular: Negative for chest pain and palpitations.  Gastrointestinal: Positive for vomiting (hematemesis x 1). Negative for abdominal pain, blood in stool, diarrhea and nausea.  Genitourinary: Negative for dysuria and hematuria.  Musculoskeletal: Negative for arthralgias and back pain.  Skin: Negative for color change and rash.  Neurological: Negative for seizures and syncope.  All other systems reviewed and are negative.    Physical Exam Updated Vital Signs BP (!) 103/91 (BP Location: Left Arm)   Pulse (!) 59   Temp 97.9 F (36.6 C) (Oral)   Resp 18   Ht 5\' 5"  (1.651 m)   Wt 86.2 kg   SpO2 100%   BMI 31.62 kg/m   Physical Exam  Constitutional: She is oriented to person,  place, and time. She appears well-developed and well-nourished.  Morbidly obese Caucasian female.  HENT:  Head: Normocephalic and atraumatic.  Mouth/Throat: Oropharynx is clear and moist.  Eyes: Pupils are equal, round, and reactive to light. Conjunctivae and EOM are normal.  Neck: Normal range of motion. Neck supple.  Cardiovascular: Normal rate, regular rhythm and intact distal pulses.  No murmur heard. Pulmonary/Chest: Effort normal and breath sounds normal. No respiratory distress. She has no wheezes. She has no rales.  Abdominal: Soft. She exhibits no distension. There is no tenderness. There is no guarding.  Musculoskeletal: Normal range of motion. She exhibits no edema.  Neurological: She is alert and oriented  to person, place, and time.  Skin: Skin is warm and dry. Capillary refill takes less than 2 seconds. No rash noted.  Psychiatric: She has a normal mood and affect.  Nursing note and vitals reviewed.    ED Treatments / Results  Labs (all labs ordered are listed, but only abnormal results are displayed) Labs Reviewed  COMPREHENSIVE METABOLIC PANEL - Abnormal; Notable for the following components:      Result Value   Glucose, Bld 108 (*)    BUN 30 (*)    Creatinine, Ser 1.72 (*)    AST 189 (*)    ALT 93 (*)    GFR calc non Af Amer 29 (*)    GFR calc Af Amer 33 (*)    All other components within normal limits  CBC - Abnormal; Notable for the following components:   WBC 10.7 (*)    All other components within normal limits  URINALYSIS, ROUTINE W REFLEX MICROSCOPIC - Abnormal; Notable for the following components:   Color, Urine AMBER (*)    APPearance HAZY (*)    Glucose, UA 50 (*)    Hgb urine dipstick SMALL (*)    Leukocytes, UA LARGE (*)    WBC, UA >50 (*)    Bacteria, UA MANY (*)    All other components within normal limits  CBG MONITORING, ED - Abnormal; Notable for the following components:   Glucose-Capillary 34 (*)    All other components within normal  limits  CBG MONITORING, ED - Abnormal; Notable for the following components:   Glucose-Capillary 40 (*)    All other components within normal limits  CBG MONITORING, ED - Abnormal; Notable for the following components:   Glucose-Capillary 47 (*)    All other components within normal limits  CBG MONITORING, ED - Abnormal; Notable for the following components:   Glucose-Capillary 142 (*)    All other components within normal limits  POC OCCULT BLOOD, ED  CBG MONITORING, ED  TYPE AND SCREEN  ABO/RH    EKG EKG Interpretation  Date/Time:  Friday May 19 2018 11:30:39 EDT Ventricular Rate:  62 PR Interval:  166 QRS Duration: 114 QT Interval:  432 QTC Calculation: 438 R Axis:   -2 Text Interpretation:  Normal sinus rhythm Minimal voltage criteria for LVH, may be normal variant Possible Anterior infarct , age undetermined Abnormal ECG No significant change since last tracing Confirmed by Orlie Dakin [phone]) on 05/19/2018 2:54:48 PM   Radiology No results found.  Procedures Procedures (including critical care time)  Medications Ordered in ED Medications  dextrose (GLUTOSE) 40 % oral gel 37.5 g (37.5 g Oral Given 05/19/18 1448)  dextrose 50 % solution 50 mL (50 mLs Intravenous Given 05/19/18 1538)     Initial Impression / Assessment and Plan / ED Course  I have reviewed the triage vital signs and the nursing notes.  Pertinent labs & imaging results that were available during my care of the patient were reviewed by me and considered in my medical decision making (see chart for details).     Patient is a 72yo female with PMHx of CKD IV, DM, HTN, dementia, CVA, and recent R wrist fx s/p repair a couple of weeks ago who presents with hematemesis today x one episode.  She is asymptomatic without HA, lightheadedness or abdominal pain.  On arrival she was hypoglycemic to the 30s and had no response to oral glucose.  Ultrasound-guided IV placed in her right AC and D50 given.  On  arrival she is HDS and well appearing.  Exam as above unremarkable.  Abdomen benign.  Rectal exam without gross blood.  Stool guaiac negative.  NG tube placed without blood.  No focal neurologic deficits.  Labs obtained including type and screen.  Recheck glucose s/p D50 is 143.  Will attempt p.o. challenge.  Labs unremarkable.  Hemoglobin is 13.1 which is actually improved from prior on 01/28/2018 (Hgb at 11.7).   CMP with mild LFT elevation otherwise at baseline.  UA without infection.  No toxic, metabolic, or infectious etiologies identified.  Patient has been hemodynamically stable throughout ED stay.  Etiology of single hematemesis episode unclear at this time however doubt emergent process at this time as this would be inconsistent with history and physical, low risk, other diagnosis much more likely.  Patient with dementia and intermittent confusion at baseline likely causing nonadherence to prescribed insulin regiment and resulting hypoglycemia episode.  Patient tolerated sandwich without difficulty.  POC glucose recheck 93.  Instructed patient to decrease insulin to 30U in AM and 45U in PM as previously Rx.  Message sent to PCP to arrange this follow-up.    Old records reviewed.  Imaging and labs reviewed and interpreted by myself and attending and used in the MDM.  Addressed patient question and concerns.  Reviewed discharged instructions with strict precautions given.  Advised patient to schedule follow-up with primary care provider.  Patient verbalized understanding and agrees with plan.  Patient stable at discharge.  The plan for this patient was discussed with Dr. Winfred Leeds who voiced agreement and who oversaw evaluation and treatment of this patient.  Final Clinical Impressions(s) / ED Diagnoses   Final diagnoses:  Hematemesis with nausea  Hypoglycemia    ED Discharge Orders    None       Fabian November, MD 05/20/18 5300    Orlie Dakin, MD 05/22/18 1140

## 2018-05-24 DIAGNOSIS — S52531D Colles' fracture of right radius, subsequent encounter for closed fracture with routine healing: Secondary | ICD-10-CM | POA: Diagnosis not present

## 2018-09-17 ENCOUNTER — Emergency Department (HOSPITAL_COMMUNITY)
Admission: EM | Admit: 2018-09-17 | Discharge: 2018-09-17 | Disposition: A | Discharge status: Home / Self Care | Payer: Medicare Other | Attending: Emergency Medicine | Admitting: Emergency Medicine

## 2018-09-17 DIAGNOSIS — M5489 Other dorsalgia: Secondary | ICD-10-CM | POA: Diagnosis not present

## 2018-09-17 DIAGNOSIS — Z87891 Personal history of nicotine dependence: Secondary | ICD-10-CM | POA: Diagnosis not present

## 2018-09-17 DIAGNOSIS — G8929 Other chronic pain: Secondary | ICD-10-CM

## 2018-09-17 DIAGNOSIS — M545 Low back pain: Secondary | ICD-10-CM | POA: Diagnosis not present

## 2018-09-17 DIAGNOSIS — R001 Bradycardia, unspecified: Secondary | ICD-10-CM | POA: Diagnosis not present

## 2018-09-17 DIAGNOSIS — I129 Hypertensive chronic kidney disease with stage 1 through stage 4 chronic kidney disease, or unspecified chronic kidney disease: Secondary | ICD-10-CM | POA: Diagnosis not present

## 2018-09-17 DIAGNOSIS — M25512 Pain in left shoulder: Secondary | ICD-10-CM | POA: Insufficient documentation

## 2018-09-17 DIAGNOSIS — R2689 Other abnormalities of gait and mobility: Secondary | ICD-10-CM | POA: Diagnosis not present

## 2018-09-17 DIAGNOSIS — Z79899 Other long term (current) drug therapy: Secondary | ICD-10-CM | POA: Insufficient documentation

## 2018-09-17 DIAGNOSIS — N184 Chronic kidney disease, stage 4 (severe): Secondary | ICD-10-CM | POA: Diagnosis not present

## 2018-09-17 LAB — COMPREHENSIVE METABOLIC PANEL
ALT: 15 U/L (ref 0–44)
AST: 25 U/L (ref 15–41)
Albumin: 3.7 g/dL (ref 3.5–5.0)
Alkaline Phosphatase: 84 U/L (ref 38–126)
Anion gap: 14 (ref 5–15)
BILIRUBIN TOTAL: 0.6 mg/dL (ref 0.3–1.2)
BUN: 36 mg/dL — AB (ref 8–23)
CO2: 24 mmol/L (ref 22–32)
CREATININE: 2.23 mg/dL — AB (ref 0.44–1.00)
Calcium: 10.2 mg/dL (ref 8.9–10.3)
Chloride: 96 mmol/L — ABNORMAL LOW (ref 98–111)
GFR calc Af Amer: 25 mL/min — ABNORMAL LOW (ref >60)
GFR calc non Af Amer: 21 mL/min — ABNORMAL LOW (ref >60)
Glucose, Bld: 110 mg/dL — ABNORMAL HIGH (ref 70–99)
Potassium: 3.7 mmol/L (ref 3.5–5.1)
Sodium: 134 mmol/L — ABNORMAL LOW (ref 135–145)
Total Protein: 7.7 g/dL (ref 6.5–8.1)

## 2018-09-17 LAB — CBC WITH DIFFERENTIAL/PLATELET
ABS IMMATURE GRANULOCYTES: 0.11 10*3/uL — AB (ref 0.00–0.07)
BASOS PCT: 0 %
Basophils Absolute: 0.1 10*3/uL (ref 0.0–0.1)
EOS ABS: 0.2 10*3/uL (ref 0.0–0.5)
Eosinophils Relative: 1 %
HEMATOCRIT: 40.6 % (ref 36.0–46.0)
HEMOGLOBIN: 13.5 g/dL (ref 12.0–15.0)
Immature Granulocytes: 1 %
Lymphocytes Relative: 16 %
Lymphs Abs: 1.9 10*3/uL (ref 0.7–4.0)
MCH: 29 pg (ref 26.0–34.0)
MCHC: 33.3 g/dL (ref 30.0–36.0)
MCV: 87.3 fL (ref 80.0–100.0)
MONO ABS: 0.9 10*3/uL (ref 0.1–1.0)
Monocytes Relative: 7 %
NEUTROS ABS: 9.3 10*3/uL — AB (ref 1.7–7.7)
Neutrophils Relative %: 75 %
Platelets: 249 10*3/uL (ref 150–400)
RBC: 4.65 MIL/uL (ref 3.87–5.11)
RDW: 12.4 % (ref 11.5–15.5)
WBC: 12.5 10*3/uL — ABNORMAL HIGH (ref 4.0–10.5)
nRBC: 0 % (ref 0.0–0.2)

## 2018-09-17 LAB — TROPONIN I: Troponin I: <0.03 ng/mL (ref <0.03)

## 2018-09-17 LAB — LIPASE, BLOOD: LIPASE: 43 U/L (ref 11–51)

## 2018-09-17 MED ORDER — SODIUM CHLORIDE 0.9 % IV BOLUS
500.0000 mL | Freq: Once | INTRAVENOUS | Status: AC
  Administered 2018-09-17: 500 mL via INTRAVENOUS

## 2018-09-17 NOTE — ED Notes (Signed)
Pt noted to be hypotensive in triage, checked on each arm. Pt is sitting comfortably in triage, mentating appropriately. Denies dizziness, lightheadedness, etc.

## 2018-09-17 NOTE — ED Triage Notes (Signed)
BIB EMS from home, pt report pain to back, legs, shoulders X1 day.

## 2018-09-17 NOTE — ED Notes (Signed)
Blood pressure WNL in room, states she came in today b/c she was starting to loose her balance and almost fell today.  Pt states she has had "back problems and hip problems for a long time" but today it has caused her to lose her balance several times in the past two days.

## 2018-09-17 NOTE — ED Notes (Signed)
Nurse collecting labs. 

## 2018-09-17 NOTE — ED Provider Notes (Signed)
[place] EMERGENCY DEPARTMENT Provider Note   CSN: [id number] Arrival date & time: 09/17/18  0053     History   Chief Complaint Chief Complaint  Patient presents with  . Back Pain    HPI Tamara Fry is a 73 y.o. female.  Patient presents to the emergency department for evaluation of left shoulder and low back pain.  Patient reports that the symptoms began earlier today.  She does report that she had a fall earlier today.  She states that she often loses her balance.  This happened earlier today and she was able to fall onto her bed, does not think that she had any injury.  The pain did, however, began after that fall.  Patient reports that she has chronic problems with her left shoulder, has been told she needs surgery.  The pain is behind the shoulder, the area is tender and she has some pain with moving the arm.  She also is complaining of low back pain that worsens with bending and twisting.  Pain does not radiate to lower extremities.     Past Medical History:  Diagnosis Date  . Chronic kidney disease    Stage 4  . Depression   . Diabetes mellitus    Type I per office notes  . Hyperlipidemia   . Hypertension   . Memory loss    "short term"  . Migraine   . Stroke [facility])    TIA - no residual    Patient Active Problem List   Diagnosis Date Noted  . Essential hypertension 06/20/2015  . Mild cognitive impairment 12/18/2014  . Migraine without aura and without status migrainosus, not intractable 12/18/2014  . Benign paroxysmal positional vertigo 12/18/2014  . Vertigo 06/16/2014  . Migraine, unspecified, without mention of intractable migraine without mention of status migrainosus 04/20/2014  . Lacunar stroke ([place]) 01/03/2014  . Chest pain 07/26/2013  . Headache 07/26/2013  . Diabetes mellitus ([place]) 07/26/2013  . CVA (cerebral infarction) 07/26/2013  . TIA (transient ischemic attack) 07/25/2013  . Diabetes ([place]) 04/07/2010  . Hyperlipidemia  04/07/2010  . OBESITY 04/07/2010  . CHRONIC PAIN SYNDROME 04/07/2010  . Hypertension 04/07/2010  . ARTHRITIS, CHRONIC 04/07/2010  . CHEST PAIN 04/07/2010  . MIGRAINES, HX OF 04/07/2010    Past Surgical History:  Procedure Laterality Date  . ABDOMINAL HYSTERECTOMY    . ANKLE ARTHODESIS W/ ARTHROSCOPY Left   . CATARACT EXTRACTION Bilateral   . CHOLECYSTECTOMY    . WRIST FRACTURE SURGERY       OB History   No obstetric history on file.      Home Medications    Prior to Admission medications   Medication Sig Start Date End Date Taking? Authorizing Provider  aspirin EC 81 MG tablet Take 81 mg by mouth at bedtime.    Yes [provider]  atorvastatin (LIPITOR) 20 MG tablet Take 1 tablet (20 mg total) by mouth daily. Patient taking differently: Take 20 mg by mouth at bedtime.  03/10/18 09/17/18 Yes Park Liter, MD  desvenlafaxine (PRISTIQ) 50 MG 24 hr tablet Take 50 mg by mouth at bedtime.    Yes [provider]  diphenhydrAMINE (BENADRYL) 25 MG tablet Take 25 mg by mouth at bedtime as needed (seasonal allergies).    Yes [provider]  donepezil (ARICEPT) 10 MG tablet Take 1 tablet (10 mg total) by mouth daily. Patient taking differently: Take 10 mg by mouth at bedtime.  06/20/15  Yes Ellouise Newer  M, MD  fenofibrate (TRICOR) 48 MG tablet Take 48 mg by mouth at bedtime.   Yes [provider]  ibuprofen (ADVIL,MOTRIN) 200 MG tablet Take 200 mg by mouth at bedtime as needed for headache (pain).    Yes [provider]  insulin lispro protamine-lispro (HUMALOG 50/50 MIX) (50-50) 100 UNIT/ML SUSP injection Inject 0.3-0.45 mLs (30-45 Units total) into the skin 2 (two) times daily before a meal. Inject 30 units every morning and 45 units every night Patient taking differently: Inject 50 Units into the skin daily before supper.  06/17/14  Yes Hosie Poisson, MD  losartan (COZAAR) 50 MG tablet Take 50 mg by mouth at bedtime.    Yes [provider]  nebivolol (BYSTOLIC) 10 MG tablet Take 0.5 tablets (5 mg total) by mouth daily. Patient taking differently: Take 10 mg by mouth at bedtime.  06/17/14  Yes Hosie Poisson, MD    Family History Family History  Problem Relation Age of Onset  . Hypertension Mother   . Diabetes Mellitus II Father   . Prostate cancer Father     Social History Social History   Tobacco Use  . Smoking status: Former Smoker    Packs/day: 1.00    Years: 5.00    Pack years: 5.00    Types: Cigarettes  . Smokeless tobacco: Never Used  . Tobacco comment: 06/22/17- "quit at least 40 years ago"  Substance Use Topics  . Alcohol use: No    Alcohol/week: 0.0 standard drinks  . Drug use: No     Allergies   Patient has no known allergies.   Review of Systems Review of Systems  Respiratory: Negative for shortness of breath.   Cardiovascular: Negative for chest pain.  Musculoskeletal: Positive for arthralgias and back pain.  All other systems reviewed and are negative.    Physical Exam Updated Vital Signs BP (!) 139/53   Pulse 91   Temp 98 F (36.7 C) (Oral)   Resp 17   SpO2 96%   Physical Exam Vitals signs and nursing note reviewed.  Constitutional:      General: She is not in acute distress.    Appearance: Normal appearance. She is well-developed.  HENT:     Head: Normocephalic and atraumatic.     Right Ear: Hearing normal.     Left Ear: Hearing normal.     Nose: Nose normal.  Eyes:     Conjunctiva/sclera: Conjunctivae normal.     Pupils: Pupils are equal, round, and reactive to light.  Neck:     Musculoskeletal: Normal range of motion and neck supple.  Cardiovascular:     Rate and Rhythm: Regular rhythm.     Heart sounds: S1 normal and S2 normal. No murmur. No friction rub. No gallop.   Pulmonary:     Effort: Pulmonary effort is normal. No respiratory distress.     Breath sounds: Normal breath sounds.  Chest:     Chest wall: No tenderness.  Abdominal:     General:  Bowel sounds are normal.     Palpations: Abdomen is soft.     Tenderness: There is no abdominal tenderness. There is no guarding or rebound. Negative signs include Murphy's sign and McBurney's sign.     Hernia: No hernia is present.  Musculoskeletal: Normal range of motion.     Left shoulder: She exhibits tenderness. She exhibits no deformity.     Lumbar back: She exhibits tenderness.       Back:  Skin:  General: Skin is warm and dry.     Findings: No rash.  Neurological:     Mental Status: She is alert and oriented to person, place, and time.     GCS: GCS eye subscore is 4. GCS verbal subscore is 5. GCS motor subscore is 6.     Cranial Nerves: No cranial nerve deficit.     Sensory: No sensory deficit.     Coordination: Coordination normal.  Psychiatric:        Speech: Speech normal.        Behavior: Behavior normal.        Thought Content: Thought content normal.      ED Treatments / Results  Labs (all labs ordered are listed, but only abnormal results are displayed) Labs Reviewed  CBC WITH DIFFERENTIAL/PLATELET - Abnormal; Notable for the following components:      Result Value   WBC 12.5 (*)    Neutro Abs 9.3 (*)    Abs Immature Granulocytes 0.11 (*)    All other components within normal limits  COMPREHENSIVE METABOLIC PANEL - Abnormal; Notable for the following components:   Sodium 134 (*)    Chloride 96 (*)    Glucose, Bld 110 (*)    BUN 36 (*)    Creatinine, Ser 2.23 (*)    GFR calc non Af Amer 21 (*)    GFR calc Af Amer 25 (*)    All other components within normal limits  LIPASE, BLOOD  TROPONIN I  URINALYSIS, ROUTINE W REFLEX MICROSCOPIC    EKG EKG Interpretation  Date/Time:  Sunday September 17 2018 03:13:24 EST Ventricular Rate:  53 PR Interval:  182 QRS Duration: 132 QT Interval:  490 QTC Calculation: 459 R Axis:   -14 Text Interpretation:  Sinus bradycardia Left ventricular hypertrophy with QRS widening Abnormal ECG No significant change since  last tracing Confirmed by Orpah Greek [phone]) on 09/17/2018 3:42:54 AM   Radiology No results found.  Procedures Procedures (including critical care time)  Medications Ordered in ED Medications  sodium chloride 0.9 % bolus 500 mL (0 mLs Intravenous Stopped 09/17/18 0401)     Initial Impression / Assessment and Plan / ED Course  I have reviewed the triage vital signs and the nursing notes.  Pertinent labs & imaging results that were available during my care of the patient were reviewed by me and considered in my medical decision making (see chart for details).     Patient presents to the emergency department for evaluation of left shoulder pain and back pain.  These both seem to be chronic issues.  She has not had any direct injury.  Both areas are tender to the touch and pain is reproducible with movement.  Patient was noted to be hypotensive in triage.  It is not clear if this was an accurate reading.  When she got back to the exam room blood pressure was normal and she has not had any hypotensive values during the period of evaluation.  She is asymptomatic.  Her work-up has been unremarkable.  No urine was obtained as the patient somehow missed the hat when urinating.  When asked for a second sample, patient became angry, stating she was in the ER for hours and did not want to stay any longer.  She asked to be discharged.  As her work-up is unremarkable and she appears well, she was discharged.  Final Clinical Impressions(s) / ED Diagnoses   Final diagnoses:  Chronic midline low back pain  without sciatica  Chronic left shoulder pain    ED Discharge Orders    None       , Gwenyth Allegra, MD 09/17/18 [phone]

## 2018-09-19 ENCOUNTER — Other Ambulatory Visit: Payer: Self-pay

## 2018-09-19 NOTE — Patient Outreach (Signed)
[facility]) Care Management  09/19/2018  KESA BIRKY 12-23-1945 [id number]   Referral Date: 09/19/2018 Referral Source: [facility] referral Referral Reason: Recent ED visit, fall, early dementia, lives alone   Outreach Attempt: no answer.  HIPAA compliant voice message left.  Plan: RN CM will attempt again within 4 business days and send letter.    Jone Baseman, RN, MSN [facility] Care Management Care Management Coordinator Direct Line [phone] Toll Free: [phone]  Fax: [phone]

## 2018-09-20 ENCOUNTER — Other Ambulatory Visit: Payer: Self-pay

## 2018-09-20 NOTE — Patient Outreach (Signed)
[place] [facility]) Care Management  09/20/2018  QUANNA WITTKE 11-25-1945 [id number]   Referral Date: 09/19/2018 Referral Source: [facility] referral Referral Reason: Recent ED visit, fall, early dementia, lives alone   Outreach Attempt: spoke with patient who states she cannot talk right now.  CM contact information given for return call.    Plan: RN CM will wait for patient return call. If no return call CM will contact again within 4 business days.    Jone Baseman, RN, MSN [place] Management Care Management Coordinator Direct Line [phone] Cell [phone] Toll Free: [phone]  Fax: [phone]

## 2018-09-21 ENCOUNTER — Other Ambulatory Visit: Payer: Self-pay

## 2018-09-21 NOTE — Patient Outreach (Signed)
[facility]) Care Management  09/21/2018  Tamara Fry 1946-05-27 [id number]   Referral Date:09/19/2018 Referral Source:[facility] referral Referral Reason:Recent ED visit, fall, early dementia, lives alone  Outreach Attempt: spoke with patient.  She reports she is doing good.  She reports that she lives alone but her sons check on her everyday.  Patient reports that she is independent with all her care and still able to cook.  Patient reports that she manages her own medications and is able to afford them.  Patient acknowledges recent fall but states she does fine.    Patient admits to DM, HTN, Hyperlipidemia, arthritis and previous stroke.  Patient reports that she manages good with her diabetes and HTN.  She states that her blood sugar last check was 180 last night but she ate some cake. She states it is normally not that high.  Discussed [facility] services and support with patient. She declined services at this time.  Advised patient that CM did send letter and brochure and she could review and keep for future reference.  She verbalized understanding.    Plan: RN CM will close case.    Jone Baseman, RN, MSN [place] Management Care Management Coordinator Direct Line [phone] Cell [phone] Toll Free: [phone]  Fax: [phone]

## 2018-09-22 ENCOUNTER — Ambulatory Visit: Payer: Self-pay

## 2018-11-16 IMAGING — DX DG WRIST COMPLETE 3+V*R*
3 series · 3 of 3 positions shown · non-contrast
Comparison: None.

CLINICAL DATA: Fell on outstretched hand.  Pain.

EXAM:
RIGHT WRIST - COMPLETE 3+ VIEW

[wrist pa]
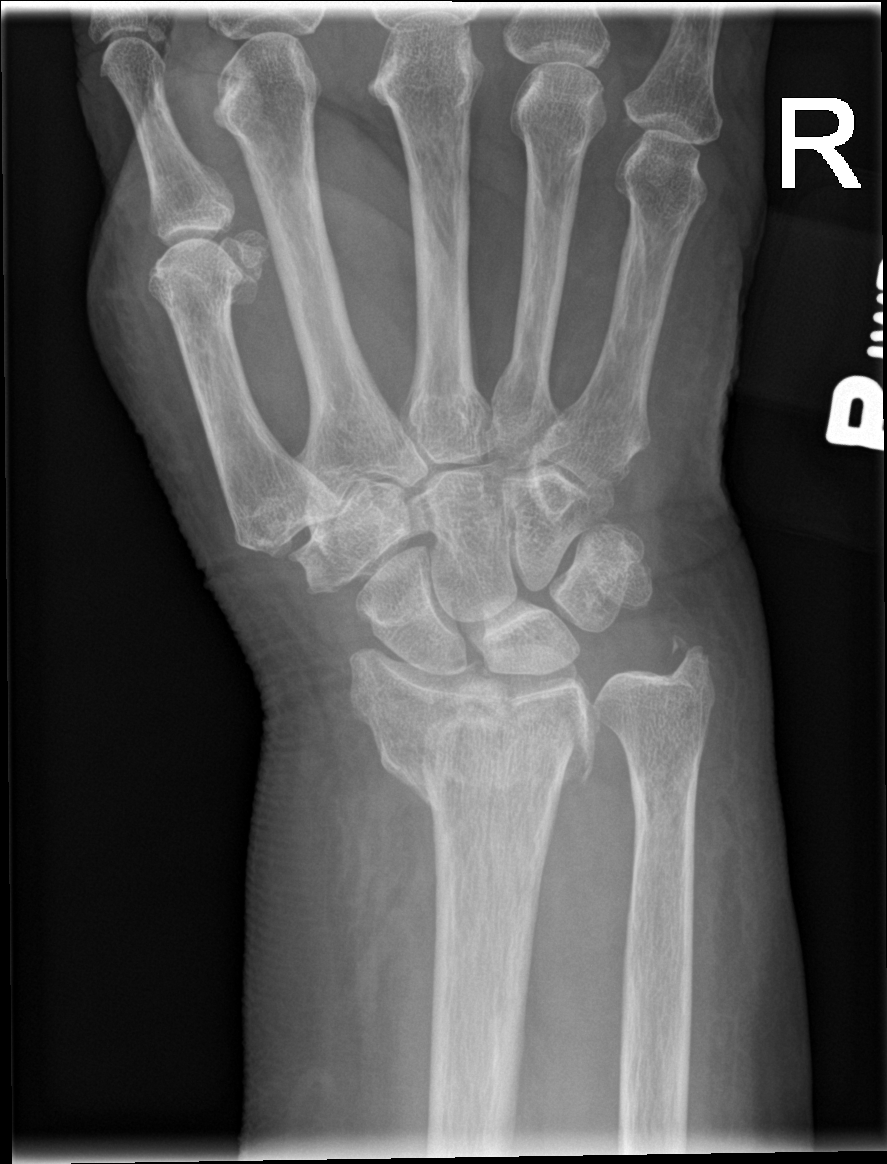

[wrist obl]
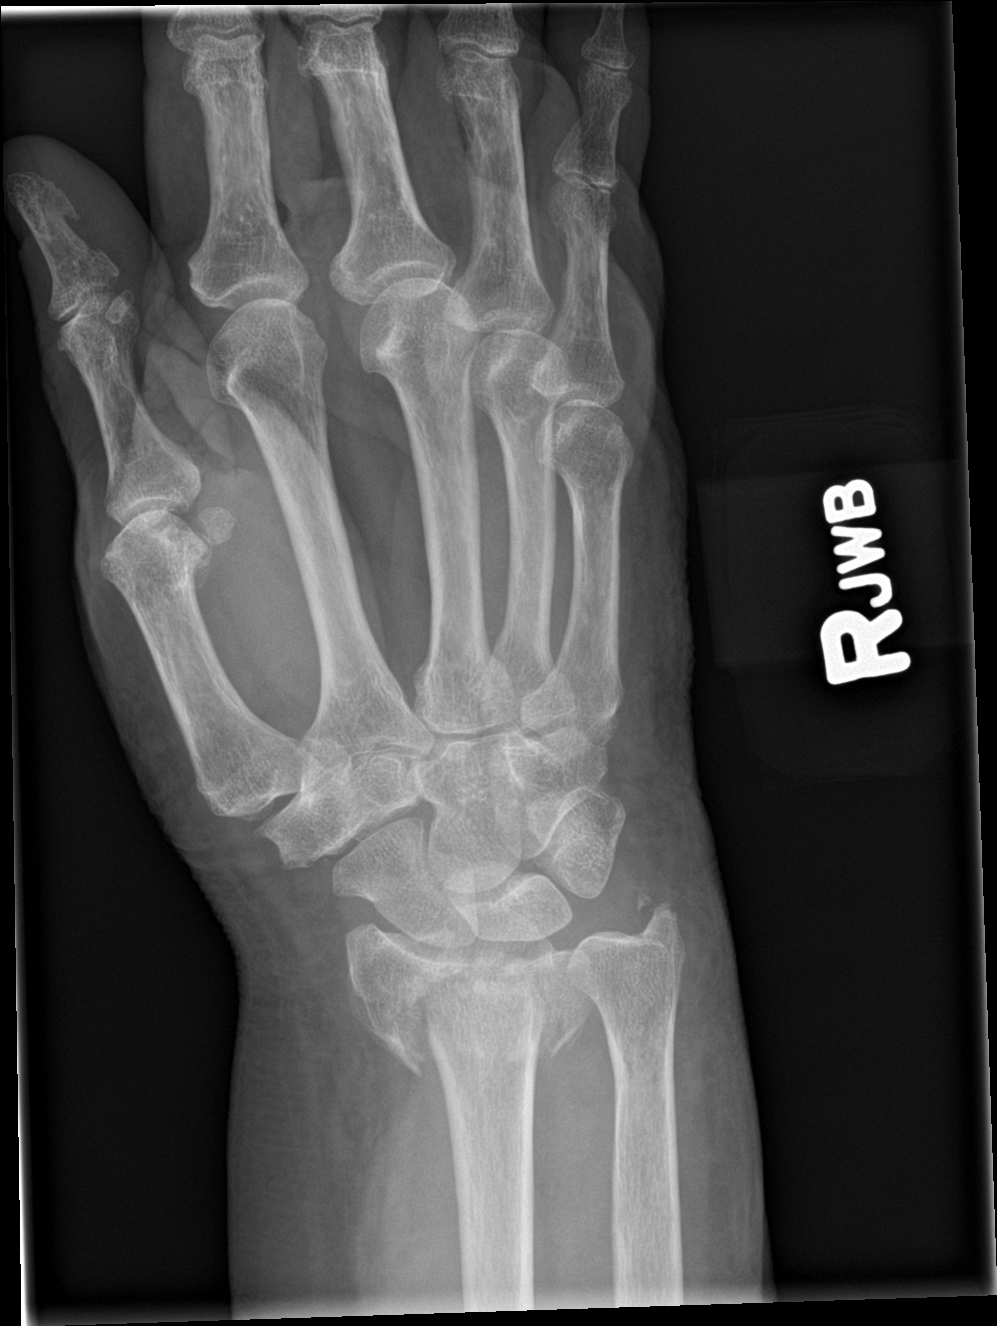

[wrist lat]
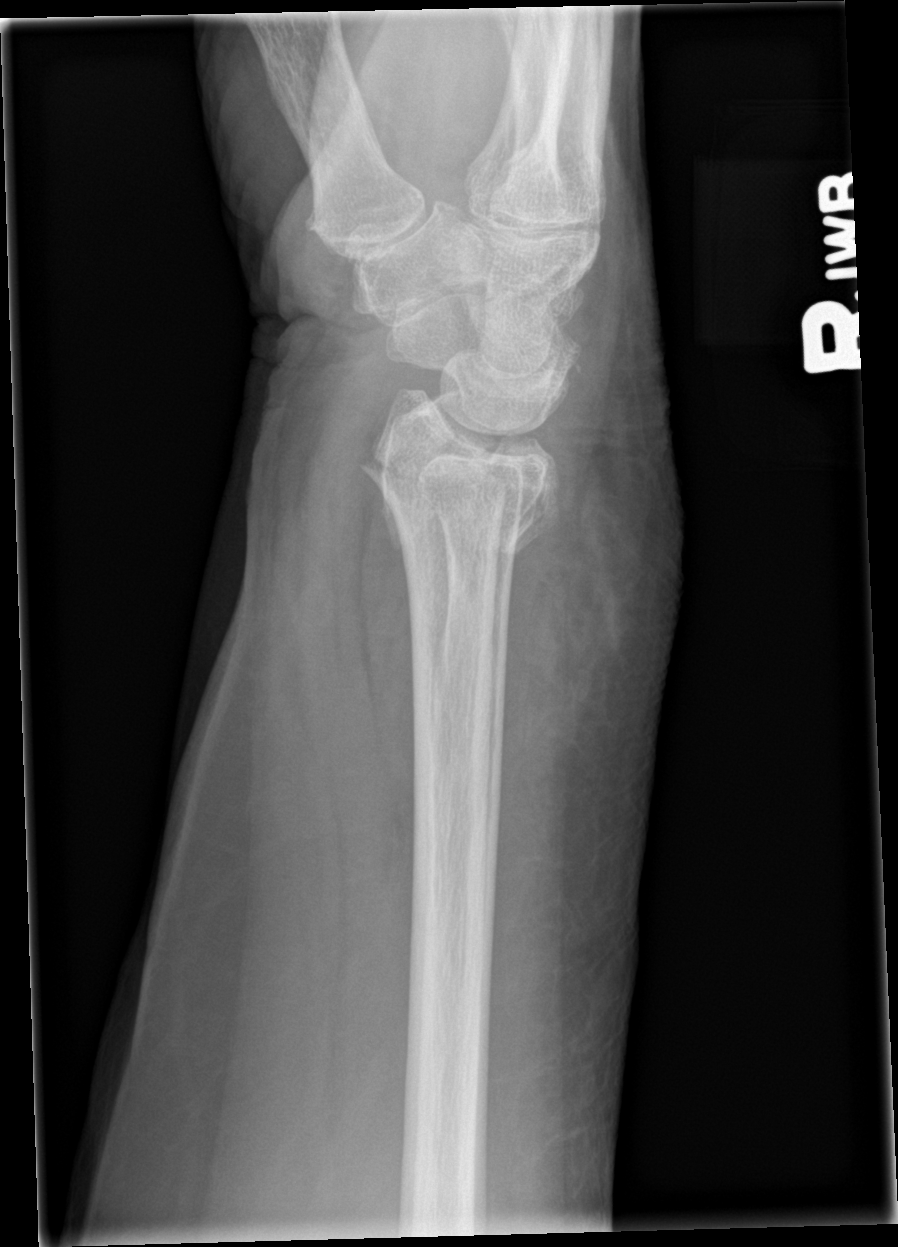

[3 of 3 positions shown; findings below may reference images not displayed]

FINDINGS: Acute, closed, intra-articular and dorsally angulated comminuted
fracture of the right distal radial metaphysis and epiphysis
extending into the radiocarpal joint at the level of the lunate
fossa as well as distal radioulnar joint. Minimally avulsed ulnar
styloid fracture. Associated soft tissue swelling about the wrist
more so dorsally. Osteoarthritis of the first CMC joint. Carpal rows
are maintained.
IMPRESSION: Acute intra-articular and dorsally angulated fracture of the distal
radius with avulsed ulnar styloid as above. Periarticular soft
tissue swelling secondary to fracture.

## 2019-01-09 DIAGNOSIS — I10 Essential (primary) hypertension: Secondary | ICD-10-CM | POA: Diagnosis not present

## 2019-01-09 DIAGNOSIS — E789 Disorder of lipoprotein metabolism, unspecified: Secondary | ICD-10-CM | POA: Diagnosis not present

## 2019-01-09 DIAGNOSIS — E118 Type 2 diabetes mellitus with unspecified complications: Secondary | ICD-10-CM | POA: Diagnosis not present

## 2019-01-10 ENCOUNTER — Encounter (HOSPITAL_COMMUNITY): Payer: Self-pay

## 2019-01-10 ENCOUNTER — Observation Stay (HOSPITAL_COMMUNITY)
Admission: EM | Admit: 2019-01-10 | Discharge: 2019-01-11 | Disposition: A | Discharge status: Home / Self Care | Payer: Medicare Other | Attending: Internal Medicine | Admitting: Internal Medicine

## 2019-01-10 ENCOUNTER — Other Ambulatory Visit: Payer: Self-pay

## 2019-01-10 DIAGNOSIS — I10 Essential (primary) hypertension: Secondary | ICD-10-CM | POA: Diagnosis present

## 2019-01-10 DIAGNOSIS — F329 Major depressive disorder, single episode, unspecified: Secondary | ICD-10-CM | POA: Insufficient documentation

## 2019-01-10 DIAGNOSIS — E1065 Type 1 diabetes mellitus with hyperglycemia: Secondary | ICD-10-CM | POA: Diagnosis not present

## 2019-01-10 DIAGNOSIS — E669 Obesity, unspecified: Secondary | ICD-10-CM | POA: Insufficient documentation

## 2019-01-10 DIAGNOSIS — E785 Hyperlipidemia, unspecified: Secondary | ICD-10-CM | POA: Diagnosis not present

## 2019-01-10 DIAGNOSIS — Z833 Family history of diabetes mellitus: Secondary | ICD-10-CM | POA: Insufficient documentation

## 2019-01-10 DIAGNOSIS — I129 Hypertensive chronic kidney disease with stage 1 through stage 4 chronic kidney disease, or unspecified chronic kidney disease: Secondary | ICD-10-CM | POA: Diagnosis not present

## 2019-01-10 DIAGNOSIS — F0390 Unspecified dementia without behavioral disturbance: Secondary | ICD-10-CM | POA: Insufficient documentation

## 2019-01-10 DIAGNOSIS — G894 Chronic pain syndrome: Secondary | ICD-10-CM | POA: Insufficient documentation

## 2019-01-10 DIAGNOSIS — E1165 Type 2 diabetes mellitus with hyperglycemia: Secondary | ICD-10-CM | POA: Diagnosis not present

## 2019-01-10 DIAGNOSIS — Z1159 Encounter for screening for other viral diseases: Secondary | ICD-10-CM | POA: Insufficient documentation

## 2019-01-10 DIAGNOSIS — R4189 Other symptoms and signs involving cognitive functions and awareness: Secondary | ICD-10-CM | POA: Diagnosis present

## 2019-01-10 DIAGNOSIS — Z87891 Personal history of nicotine dependence: Secondary | ICD-10-CM | POA: Diagnosis not present

## 2019-01-10 DIAGNOSIS — Z79899 Other long term (current) drug therapy: Secondary | ICD-10-CM | POA: Diagnosis not present

## 2019-01-10 DIAGNOSIS — Z794 Long term (current) use of insulin: Secondary | ICD-10-CM | POA: Diagnosis not present

## 2019-01-10 DIAGNOSIS — E1122 Type 2 diabetes mellitus with diabetic chronic kidney disease: Secondary | ICD-10-CM | POA: Insufficient documentation

## 2019-01-10 DIAGNOSIS — N184 Chronic kidney disease, stage 4 (severe): Secondary | ICD-10-CM | POA: Diagnosis present

## 2019-01-10 DIAGNOSIS — Z6830 Body mass index (BMI) 30.0-30.9, adult: Secondary | ICD-10-CM | POA: Insufficient documentation

## 2019-01-10 DIAGNOSIS — Z8673 Personal history of transient ischemic attack (TIA), and cerebral infarction without residual deficits: Secondary | ICD-10-CM | POA: Insufficient documentation

## 2019-01-10 DIAGNOSIS — Z8249 Family history of ischemic heart disease and other diseases of the circulatory system: Secondary | ICD-10-CM | POA: Diagnosis not present

## 2019-01-10 DIAGNOSIS — Z7982 Long term (current) use of aspirin: Secondary | ICD-10-CM | POA: Insufficient documentation

## 2019-01-10 DIAGNOSIS — R739 Hyperglycemia, unspecified: Secondary | ICD-10-CM | POA: Diagnosis present

## 2019-01-10 DIAGNOSIS — R2681 Unsteadiness on feet: Secondary | ICD-10-CM | POA: Insufficient documentation

## 2019-01-10 DIAGNOSIS — Z791 Long term (current) use of non-steroidal anti-inflammatories (NSAID): Secondary | ICD-10-CM | POA: Insufficient documentation

## 2019-01-10 DIAGNOSIS — N390 Urinary tract infection, site not specified: Secondary | ICD-10-CM | POA: Diagnosis not present

## 2019-01-10 DIAGNOSIS — I447 Left bundle-branch block, unspecified: Secondary | ICD-10-CM | POA: Diagnosis not present

## 2019-01-10 DIAGNOSIS — Z03818 Encounter for observation for suspected exposure to other biological agents ruled out: Secondary | ICD-10-CM | POA: Diagnosis not present

## 2019-01-10 DIAGNOSIS — IMO0002 Reserved for concepts with insufficient information to code with codable children: Secondary | ICD-10-CM | POA: Diagnosis present

## 2019-01-10 LAB — CBG MONITORING, ED
Glucose-Capillary: >600 mg/dL (ref 70–99)
Glucose-Capillary: 506 mg/dL (ref 70–99)
Glucose-Capillary: 432 mg/dL — ABNORMAL HIGH (ref 70–99)
Glucose-Capillary: 310 mg/dL — ABNORMAL HIGH (ref 70–99)
Glucose-Capillary: 191 mg/dL — ABNORMAL HIGH (ref 70–99)
Glucose-Capillary: 183 mg/dL — ABNORMAL HIGH (ref 70–99)

## 2019-01-10 LAB — BASIC METABOLIC PANEL
Anion gap: 19 — ABNORMAL HIGH (ref 5–15)
Anion gap: 14 (ref 5–15)
BUN: 37 mg/dL — ABNORMAL HIGH (ref 8–23)
BUN: 30 mg/dL — ABNORMAL HIGH (ref 8–23)
CO2: 20 mmol/L — ABNORMAL LOW (ref 22–32)
CO2: 19 mmol/L — ABNORMAL LOW (ref 22–32)
Calcium: 9.6 mg/dL (ref 8.9–10.3)
Calcium: 8.6 mg/dL — ABNORMAL LOW (ref 8.9–10.3)
Chloride: 86 mmol/L — ABNORMAL LOW (ref 98–111)
Chloride: 102 mmol/L (ref 98–111)
Creatinine, Ser: 2.24 mg/dL — ABNORMAL HIGH (ref 0.44–1.00)
Creatinine, Ser: 1.77 mg/dL — ABNORMAL HIGH (ref 0.44–1.00)
GFR calc Af Amer: 24 mL/min — ABNORMAL LOW (ref >60)
GFR calc Af Amer: 32 mL/min — ABNORMAL LOW (ref >60)
GFR calc non Af Amer: 21 mL/min — ABNORMAL LOW (ref >60)
GFR calc non Af Amer: 28 mL/min — ABNORMAL LOW (ref >60)
Glucose, Bld: 720 mg/dL (ref 70–99)
Glucose, Bld: 213 mg/dL — ABNORMAL HIGH (ref 70–99)
Potassium: 4.6 mmol/L (ref 3.5–5.1)
Potassium: 4.3 mmol/L (ref 3.5–5.1)
Sodium: 125 mmol/L — ABNORMAL LOW (ref 135–145)
Sodium: 135 mmol/L (ref 135–145)

## 2019-01-10 LAB — POCT I-STAT EG7
Bicarbonate: 24.7 mmol/L (ref 20.0–28.0)
Calcium, Ion: 1.12 mmol/L — ABNORMAL LOW (ref 1.15–1.40)
HCT: 37 % (ref 36.0–46.0)
Hemoglobin: 12.6 g/dL (ref 12.0–15.0)
O2 Saturation: 31 %
Patient temperature: 37
Potassium: 4.8 mmol/L (ref 3.5–5.1)
Sodium: 124 mmol/L — ABNORMAL LOW (ref 135–145)
TCO2: 26 mmol/L (ref 22–32)
pCO2, Ven: 41.4 mmHg — ABNORMAL LOW (ref 44.0–60.0)
pH, Ven: 7.383 (ref 7.250–7.430)
pO2, Ven: 20 mmHg — CL (ref 32.0–45.0)

## 2019-01-10 LAB — URINALYSIS, ROUTINE W REFLEX MICROSCOPIC
Bilirubin Urine: NEGATIVE
Glucose, UA: >=500 mg/dL — AB
Hgb urine dipstick: NEGATIVE
Ketones, ur: 5 mg/dL — AB
Nitrite: NEGATIVE
Protein, ur: NEGATIVE mg/dL
Specific Gravity, Urine: 1.022 (ref 1.005–1.030)
WBC, UA: >50 WBC/hpf — ABNORMAL HIGH (ref 0–5)
pH: 5 (ref 5.0–8.0)

## 2019-01-10 LAB — CBC
HCT: 38.4 % (ref 36.0–46.0)
Hemoglobin: 12.8 g/dL (ref 12.0–15.0)
MCH: 28.9 pg (ref 26.0–34.0)
MCHC: 33.3 g/dL (ref 30.0–36.0)
MCV: 86.7 fL (ref 80.0–100.0)
Platelets: 247 10*3/uL (ref 150–400)
RBC: 4.43 MIL/uL (ref 3.87–5.11)
RDW: 13 % (ref 11.5–15.5)
WBC: 8 10*3/uL (ref 4.0–10.5)
nRBC: 0 % (ref 0.0–0.2)

## 2019-01-10 LAB — GLUCOSE, CAPILLARY
Glucose-Capillary: >600 mg/dL (ref 70–99)
Glucose-Capillary: 202 mg/dL — ABNORMAL HIGH (ref 70–99)
Glucose-Capillary: 306 mg/dL — ABNORMAL HIGH (ref 70–99)

## 2019-01-10 LAB — HEMOGLOBIN A1C
Hgb A1c MFr Bld: 14.1 % — ABNORMAL HIGH (ref 4.8–5.6)
Mean Plasma Glucose: 357.97 mg/dL

## 2019-01-10 LAB — SARS CORONAVIRUS 2 BY RT PCR (HOSPITAL ORDER, PERFORMED IN ~~LOC~~ HOSPITAL LAB): SARS Coronavirus 2: NEGATIVE

## 2019-01-10 MED ORDER — VENLAFAXINE HCL ER 75 MG PO CP24
75.0000 mg | ORAL_CAPSULE | Freq: Every day | ORAL | Status: DC
  Administered 2019-01-10: 75 mg via ORAL
  Filled 2019-01-10 (×2): qty 1

## 2019-01-10 MED ORDER — ATORVASTATIN CALCIUM 10 MG PO TABS
20.0000 mg | ORAL_TABLET | Freq: Every day | ORAL | Status: DC
  Administered 2019-01-11: 20 mg via ORAL
  Filled 2019-01-10: qty 2

## 2019-01-10 MED ORDER — ONDANSETRON HCL 4 MG PO TABS: 4.0000 mg | ORAL_TABLET | Freq: Four times a day (QID) | ORAL | Status: DC | PRN

## 2019-01-10 MED ORDER — FENOFIBRATE 54 MG PO TABS
54.0000 mg | ORAL_TABLET | Freq: Every day | ORAL | Status: DC
  Administered 2019-01-11: 54 mg via ORAL
  Filled 2019-01-10: qty 1

## 2019-01-10 MED ORDER — ACETAMINOPHEN 650 MG RE SUPP: 650.0000 mg | Freq: Four times a day (QID) | RECTAL | Status: DC | PRN

## 2019-01-10 MED ORDER — DOCUSATE SODIUM 100 MG PO CAPS
100.0000 mg | ORAL_CAPSULE | Freq: Two times a day (BID) | ORAL | Status: DC
  Administered 2019-01-10 – 2019-01-11 (×2): 100 mg via ORAL
  Filled 2019-01-10 (×2): qty 1

## 2019-01-10 MED ORDER — SODIUM CHLORIDE 0.9 % IV BOLUS
1000.0000 mL | Freq: Once | INTRAVENOUS | Status: AC
  Administered 2019-01-10: 1000 mL via INTRAVENOUS

## 2019-01-10 MED ORDER — LACTATED RINGERS IV SOLN
INTRAVENOUS | Status: DC
  Administered 2019-01-10: 08:00:00 via INTRAVENOUS

## 2019-01-10 MED ORDER — SODIUM CHLORIDE 0.9 % IV SOLN
INTRAVENOUS | Status: DC
  Administered 2019-01-10: 12:00:00 via INTRAVENOUS

## 2019-01-10 MED ORDER — ENOXAPARIN SODIUM 40 MG/0.4ML ~~LOC~~ SOLN
40.0000 mg | SUBCUTANEOUS | Status: DC
  Filled 2019-01-10: qty 0.4

## 2019-01-10 MED ORDER — INSULIN ASPART 100 UNIT/ML ~~LOC~~ SOLN
0.0000 [IU] | Freq: Three times a day (TID) | SUBCUTANEOUS | Status: DC
  Administered 2019-01-10: 3 [IU] via SUBCUTANEOUS
  Administered 2019-01-10: 5 [IU] via SUBCUTANEOUS
  Administered 2019-01-11: 11 [IU] via SUBCUTANEOUS
  Administered 2019-01-11 (×2): 5 [IU] via SUBCUTANEOUS

## 2019-01-10 MED ORDER — ONDANSETRON HCL 4 MG/2ML IJ SOLN
4.0000 mg | Freq: Once | INTRAMUSCULAR | Status: AC
  Administered 2019-01-10: 4 mg via INTRAVENOUS
  Filled 2019-01-10: qty 2

## 2019-01-10 MED ORDER — INSULIN ASPART 100 UNIT/ML ~~LOC~~ SOLN
0.0000 [IU] | Freq: Every day | SUBCUTANEOUS | Status: DC
  Administered 2019-01-10: 4 [IU] via SUBCUTANEOUS

## 2019-01-10 MED ORDER — POTASSIUM CHLORIDE 10 MEQ/100ML IV SOLN
10.0000 meq | INTRAVENOUS | Status: AC
  Administered 2019-01-10 (×2): 10 meq via INTRAVENOUS
  Filled 2019-01-10 (×2): qty 100

## 2019-01-10 MED ORDER — HALOPERIDOL LACTATE 5 MG/ML IJ SOLN
2.0000 mg | Freq: Four times a day (QID) | INTRAMUSCULAR | Status: DC | PRN
  Administered 2019-01-10 (×2): 2 mg via INTRAVENOUS
  Filled 2019-01-10 (×2): qty 1

## 2019-01-10 MED ORDER — SODIUM CHLORIDE 0.9 % IV SOLN
INTRAVENOUS | Status: DC
  Administered 2019-01-10: 08:00:00 via INTRAVENOUS

## 2019-01-10 MED ORDER — DEXTROSE-NACL 5-0.45 % IV SOLN
INTRAVENOUS | Status: DC
  Administered 2019-01-10: 10:00:00 via INTRAVENOUS

## 2019-01-10 MED ORDER — INSULIN REGULAR(HUMAN) IN NACL 100-0.9 UT/100ML-% IV SOLN
INTRAVENOUS | Status: DC
  Administered 2019-01-10: 5.4 [IU]/h via INTRAVENOUS
  Filled 2019-01-10: qty 100

## 2019-01-10 MED ORDER — ASPIRIN EC 81 MG PO TBEC
81.0000 mg | DELAYED_RELEASE_TABLET | Freq: Every day | ORAL | Status: DC
  Administered 2019-01-10: 81 mg via ORAL
  Filled 2019-01-10: qty 1

## 2019-01-10 MED ORDER — SODIUM CHLORIDE 0.9 % IV SOLN
1.0000 g | Freq: Once | INTRAVENOUS | Status: AC
  Administered 2019-01-10: 1 g via INTRAVENOUS
  Filled 2019-01-10: qty 10

## 2019-01-10 MED ORDER — ACETAMINOPHEN 325 MG PO TABS
650.0000 mg | ORAL_TABLET | Freq: Four times a day (QID) | ORAL | Status: DC | PRN
  Filled 2019-01-10: qty 2

## 2019-01-10 MED ORDER — DONEPEZIL HCL 10 MG PO TABS
10.0000 mg | ORAL_TABLET | Freq: Every day | ORAL | Status: DC
  Administered 2019-01-10: 10 mg via ORAL
  Filled 2019-01-10: qty 1

## 2019-01-10 MED ORDER — ONDANSETRON HCL 4 MG/2ML IJ SOLN: 4.0000 mg | Freq: Four times a day (QID) | INTRAMUSCULAR | Status: DC | PRN

## 2019-01-10 MED ORDER — SODIUM CHLORIDE 0.9% FLUSH
3.0000 mL | Freq: Two times a day (BID) | INTRAVENOUS | Status: DC
  Administered 2019-01-10: 3 mL via INTRAVENOUS

## 2019-01-10 MED ORDER — INSULIN GLARGINE 100 UNIT/ML ~~LOC~~ SOLN
15.0000 [IU] | Freq: Every day | SUBCUTANEOUS | Status: DC
  Administered 2019-01-10 – 2019-01-11 (×2): 15 [IU] via SUBCUTANEOUS
  Filled 2019-01-10 (×2): qty 0.15

## 2019-01-10 NOTE — ED Notes (Signed)
This tech took glucose and it said "> than 600", had to perform new patient override on glucometer so it may not cross into chart.

## 2019-01-10 NOTE — H&P (Signed)
History and Physical    [name]:[id number] DOB: 06-Dec-1945 DOA: 01/10/2019  PCP: Jani Gravel, MD Consultants:  Agustin Cree - cardiology; Delice Lesch - neurology Patient coming from:  Home - lives alone; NOK: Sherry Ruffing, [place]   Chief Complaint: Hyperglycemia  HPI: Tamara Fry is a 73 y.o. female with medical history significant of TIA; dementa; HTN; HLD; DM; and stage IV CKD presenting with hyperglycemia.  "I am not happy!"  She didn't know that she would need to be kept in the hospital.  She is clearly confused.  She reports that she was having leg cramps.  Symptoms started maybe Saturday afternoon, just doesn't really remember.  Her sugars are high, she thinks maybe she missed 1 or 2 doses of insulin.  She usually takes it in the mornings when she gets up.  She "knew" that she had a bladder infection but isn't able to specify which symptoms she has had.  No fever.  No sick contacts.  She doesn't drive and doesn't go out much.  I called and spoke with her son.  They are considering placing her.  She is having trouble taking care of herself.  They understand that she is having trouble remembering to give herself insulin.  Her memory appears to have gotten worse lately.  She has had problems in the past but it is worse now.  Her mother and sisters have all had these same issues. They are considering placement but are very concerned about placement in the setting of the current COVID crisis.  There are 2 sons and their wives and they can provide supervision; they do not know if they are comfortable providing insulin.     ED Course:  Gas is fine, bicarb and anion gap are ok.  But has dementia and can't remember to take insulin.  Social admit.  Given Rocephin for UTI.  Case manager consult.   Review of Systems: As per HPI; otherwise review of systems reviewed and negative.    Ambulatory Status:  Ambulates without assistance  Past Medical History:  Diagnosis Date  . Chronic kidney disease     Stage 4  . Depression   . Diabetes mellitus    Type I per office notes  . Hyperlipidemia   . Hypertension   . Memory loss    "short term"  . Migraine   . Stroke [facility])    TIA - no residual    Past Surgical History:  Procedure Laterality Date  . ABDOMINAL HYSTERECTOMY    . ANKLE ARTHODESIS W/ ARTHROSCOPY Left   . CATARACT EXTRACTION Bilateral   . CHOLECYSTECTOMY    . WRIST FRACTURE SURGERY      Social History   Socioeconomic History  . Marital status: Married    Spouse name: Not on file  . Number of children: Not on file  . Years of education: Not on file  . Highest education level: Not on file  Occupational History  . Occupation: retired  Scientific laboratory technician  . Financial resource strain: Not on file  . Food insecurity:    Worry: Not on file    Inability: Not on file  . Transportation needs:    Medical: Not on file    Non-medical: Not on file  Tobacco Use  . Smoking status: Former Smoker    Packs/day: 1.00    Years: 5.00    Pack years: 5.00    Types: Cigarettes  . Smokeless tobacco: Never Used  . Tobacco comment: 06/22/17- "quit at  least 40 years ago"  Substance and Sexual Activity  . Alcohol use: No    Alcohol/week: 0.0 standard drinks  . Drug use: No  . Sexual activity: Never  Lifestyle  . Physical activity:    Days per week: Not on file    Minutes per session: Not on file  . Stress: Not on file  Relationships  . Social connections:    Talks on phone: Not on file    Gets together: Not on file    Attends religious service: Not on file    Active member of club or organization: Not on file    Attends meetings of clubs or organizations: Not on file    Relationship status: Not on file  . Intimate partner violence:    Fear of current or ex partner: Not on file    Emotionally abused: Not on file    Physically abused: Not on file    Forced sexual activity: Not on file  Other Topics Concern  . Not on file  Social History Narrative  . Not on file    No  Known Allergies  Family History  Problem Relation Age of Onset  . Hypertension Mother   . Diabetes Mellitus II Father   . Prostate cancer Father     Prior to Admission medications   Medication Sig Start Date End Date Taking? Authorizing Provider  aspirin EC 81 MG tablet Take 81 mg by mouth at bedtime.     [provider]  atorvastatin (LIPITOR) 20 MG tablet Take 1 tablet (20 mg total) by mouth daily. Patient taking differently: Take 20 mg by mouth at bedtime.  03/10/18 09/17/18  Park Liter, MD  desvenlafaxine (PRISTIQ) 50 MG 24 hr tablet Take 50 mg by mouth at bedtime.     [provider]  diphenhydrAMINE (BENADRYL) 25 MG tablet Take 25 mg by mouth at bedtime as needed (seasonal allergies).     [provider]  donepezil (ARICEPT) 10 MG tablet Take 1 tablet (10 mg total) by mouth daily. Patient taking differently: Take 10 mg by mouth at bedtime.  06/20/15   Cameron Sprang, MD  fenofibrate (TRICOR) 48 MG tablet Take 48 mg by mouth at bedtime.    [provider]  ibuprofen (ADVIL,MOTRIN) 200 MG tablet Take 200 mg by mouth at bedtime as needed for headache (pain).     [provider]  insulin lispro protamine-lispro (HUMALOG 50/50 MIX) (50-50) 100 UNIT/ML SUSP injection Inject 0.3-0.45 mLs (30-45 Units total) into the skin 2 (two) times daily before a meal. Inject 30 units every morning and 45 units every night Patient taking differently: Inject 50 Units into the skin daily before supper.  06/17/14   Hosie Poisson, MD  losartan (COZAAR) 50 MG tablet Take 50 mg by mouth at bedtime.     [provider]  nebivolol (BYSTOLIC) 10 MG tablet Take 0.5 tablets (5 mg total) by mouth daily. Patient taking differently: Take 10 mg by mouth at bedtime.  06/17/14   Hosie Poisson, MD    Physical Exam: Vitals:   01/10/19 0830 01/10/19 0900 01/10/19 1030 01/10/19 1101  BP: (!) 115/43 (!) 106/49 (!) 105/49 119/84  Pulse: 64 69 71 75  Resp: (!) 25 12  15 18   Temp:      TempSrc:      SpO2: 100% 100% 98% 98%  Height:         . General:  Appears calm and comfortable and is NAD; she  was initially agitated but was easy to calm with gentle communication . Eyes:  PERRL, EOMI, normal lids, iris . ENT:  grossly normal hearing, lips & tongue, mmm; edentulousappropriate dentition . Neck:  no LAD, masses or thyromegaly; no carotid bruits . Cardiovascular:  RRR, no m/r/g. No LE edema.  Marland Kitchen Respiratory:   CTA bilaterally with no wheezes/rales/rhonchi.  Normal respiratory effort. . Abdomen:  soft, NT, ND, NABS . Back:   normal alignment, no CVAT . Skin:  no rash or induration seen on limited exam . Musculoskeletal:  grossly normal tone BUE/BLE, good ROM, no bony abnormality . Psychiatric:  grossly normal mood and affect, speech fluent and appropriate, AOx1 (oriented to person; knows she is in a hospital but not which one or the city) . Neurologic:  CN 2-12 grossly intact, moves all extremities in coordinated fashion, sensation intact    Radiological Exams on Admission: No results found.  EKG: Independently reviewed.  NSR with rate 77; LBBB; NSCSLT   Labs on Admission: I have personally reviewed the available labs and imaging studies at the time of the admission.  Pertinent labs:   Na++ 125 -> 135 CO2 20, 19 Glucose 720 -> 213 BUN 37/Creatinine 2.24/GFR 21; 36/2.23/21 in 2/20 -> 30/1.77/28 Anion gap 19 -> 14 Normal CBC WBG: 7.383/41.4/24.7 UA: >500 glucose; 5 ketones; small LE; many bacteria; >50 WBC COVID negative  Assessment/Plan Principal Problem:   Uncontrolled diabetes mellitus (HCC) Active Problems:   Hyperlipidemia   Persistent cognitive impairment   Essential hypertension   CKD (chronic kidney disease) stage 4, GFR 15-29 ml/min (HCC)   Uncontrolled DM -Patient presenting with borderline DKA -She was started on Glucostabilizer -Within a few hours, her anion gap was closed and her glycemic control was markedly  improved -Patient acknowledges occasionally missing insulin; family acknowledges difficulty with patient remembering to take her medication -Will use DKA transition orders at this time -She has been on 50/50 insulin, but this does not appear to be her best option and she is likely to benefit from basal insulin with bolus if needed -Will start Lantus 15 units (on 50 units of 50/50, so 25 units of basal and with take about 2/3 of usual dose) -Will continue with moderate-scale SSI to continue to correct -Diabetes coordinator consult requested  Persistent cognitive impairment -The patient appears to have last been seen for cognitive impairment in 2016; her MMSE was 25-27/30 -Clearly, her impairment has progressed since -There are concerns both at home and here about her ability to care for herself independently -However, [facility] is the SNF closest to them and the family has significant concerns about placing her there to their recent COVID outbreak -They appear to be inclined to keep her home for as long as possible while providing in-home care, but they are uncertain how to proceed in obtaining assistance particularly with ensuring safe delivery of insulin daily -CM/SW consults have been placed  Stage 4 CKD -Hold Cozaar, and this medication should likely be discontinued -She should be referred for outpatient nephrology consultation -She does not appear to be a good candidate for HD -Consider palliative care consultation  HTN -Hold Bystolic for now given borderline BP -Hold Cozaar as above and consider d/c  HLD -Continue Lipitor  Possible UTI -The patient is unable to provide a significant history regarding UTI -UA is mildly abnormal but appears more related to bacteriuria than UTI -She was given one dose of Rocephin in the ER -Normal WBC count -Will hold additional doses of antibiotics at  this time -Urine culture is pending   Note: This patient has been tested and is negative for  the novel coronavirus COVID-19.  DVT prophylaxis:  Lovenox  Code Status:  Full - confirmed with patient Family Communication: None present; I spoke with her son by telephone Disposition Plan:  Home once clinically improved Consults called: None  Admission status: It is my clinical opinion that referral for OBSERVATION is reasonable and necessary in this patient based on the above information provided. The aforementioned taken together are felt to place the patient at high risk for further clinical deterioration. However it is anticipated that the patient may be medically stable for discharge from the hospital within 24 to 48 hours.    Karmen Bongo MD [facility]   How to contact the [facility] Attending or Consulting provider [place] or covering provider during after hours [place], for this patient?  1. Check the care team in [facility] and look for a) attending/consulting [facility] provider listed and b) the [facility] team listed 2. Log into [email or web address] and use [facility]'s universal password to access. If you do not have the password, please contact the hospital operator. 3. Locate the [facility] provider you are looking for under [facility] and page to a number that you can be directly reached. 4. If you still have difficulty reaching the provider, please page the [facility] (Director on Call) for the Hospitalists listed on amion for assistance.   01/10/2019, 11:16 AM

## 2019-01-10 NOTE — ED Notes (Signed)
RN spoke with Dr. Lorin Mercy regarding patient bed status changed to tele and unable to maintain IV insulin. Patients CBG 183, given SQ insulin and IV insulin DCd. Next RN updated with plan of care.

## 2019-01-10 NOTE — ED Triage Notes (Signed)
Pt in with hyperglycemia, home monitor reading "high". Denies any other complaints at this time. States CBG has been hard to keep under 400 x 2 days

## 2019-01-10 NOTE — Progress Notes (Signed)
NEW ADMISSION NOTE New Admission Note:   Arrival Method: stretcher from ED Mental Orientation: alert and oriented x3  Telemetry: Box: 18 NSR Assessment: Completed Skin: intact  IV: RAC and RFA  Pain: 0  Safety Measures: Safety Fall Prevention Plan has been given, discussed and signed Admission: Completed [facility] Orientation: Patient has been orientated to the room, unit and staff.  Family: NA  Orders have been reviewed and implemented. Will continue to monitor the patient. Call light has been placed within reach and bed alarm has been activated.   Baldo Ash, RN

## 2019-01-10 NOTE — Progress Notes (Addendum)
Inpatient Diabetes Program Recommendations  AACE/ADA: New Consensus Statement on Inpatient Glycemic Control (2015)  Target Ranges:  Prepandial:   less than 140 mg/dL      [place] postprandial:   less than 180 mg/dL (1-2 hours)      Critically ill patients:  140 - 180 mg/dL   Results for Tamara, Fry (MRN [id number]) as of 01/10/2019 09:41  Ref. Range 01/10/2019 05:49 01/10/2019 06:58 01/10/2019 08:02 01/10/2019 09:17  Glucose-Capillary Latest Ref Range: 70 - 99 mg/dL >600 (HH) 506 (HH) 432 (H) 310 (H)   Results for Tamara, Fry (MRN [id number]) as of 01/10/2019 09:41  Ref. Range 01/10/2019 04:13  Sodium Latest Ref Range: 135 - 145 mmol/L 125 (L)  Potassium Latest Ref Range: 3.5 - 5.1 mmol/L 4.6  Chloride Latest Ref Range: 98 - 111 mmol/L 86 (L)  CO2 Latest Ref Range: 22 - 32 mmol/L 20 (L)  Glucose Latest Ref Range: 70 - 99 mg/dL 720 (HH)  BUN Latest Ref Range: 8 - 23 mg/dL 37 (H)  Creatinine Latest Ref Range: 0.44 - 1.00 mg/dL 2.24 (H)  Calcium Latest Ref Range: 8.9 - 10.3 mg/dL 9.6  Anion gap Latest Ref Range: 5 - 15  19 (H)    Admit with: Hyperglycemia  History: DM, Dementia, CKD 4  Home DM Meds: Humalog 50/50 Insulin       Patient states she takes 50 units QPM with supper       Records indicate she is supposed to take 30 units in the AM and 45 units in the PM  Current Orders: IV Insulin Drip (started at 6am)     Given IVF in the ED.  IV Insulin Drip started at 6am today.  Per H&P notes, pt with dementia and cannot remember to take her insulin.  Patient told the MD that she may have forgotten to take her insulin.  Note Care management team and social work team consulted for possible home health or long-term placement after discharge.    MD- Please add on Hemoglobin A1c level to current labs  Note BMET pending.  CBGs remain >300 at this time.  Once patient meets criteria to transition to SQ Insulin, please make sure patient gets basal insulin at least 1 hour prior to d/c of IV  Insulin Drip.  When patient is ready, recommend transition to Lantus and Novolog (not 50/50 Insulin)     --Will follow patient during hospitalization--  Wyn Quaker RN, MSN, CDE Diabetes Coordinator Inpatient Glycemic Control Team Team Pager: [phone] (8a-5p)

## 2019-01-10 NOTE — ED Notes (Signed)
Sent down urine culture with UA

## 2019-01-10 NOTE — ED Notes (Signed)
ED TO INPATIENT HANDOFF REPORT  ED Nurse Name and Phone #: Joellen Jersey [phone]  S Name/Age/Gender Tamara Fry 73 y.o. female Room/Bed: 019C/019C  Code Status   Code Status: Prior  Home/SNF/Other Home Patient oriented to: self Is this baseline? Yes   Triage Complete: Triage complete  Chief Complaint sugar high  Triage Note Pt in with hyperglycemia, home monitor reading "high". Denies any other complaints at this time. States CBG has been hard to keep under 400 x 2 days   Allergies No Known Allergies  Level of Care/Admitting Diagnosis ED Disposition    ED Disposition Condition [facility] Area: [place] [100100]  Level of Care: Progressive [102]  I expect the patient will be discharged within 24 hours: Yes  LOW acuity---Tx typically complete <24 hrs---ACUTE conditions typically can be evaluated <24 hours---LABS likely to return to acceptable levels <24 hours---IS near functional baseline---EXPECTED to return to current living arrangement---NOT newly hypoxic: Does not meet criteria for 5C-Observation unit  Covid Evaluation: Screening Protocol (No Symptoms)  Diagnosis: Uncontrolled diabetes mellitus ([place]) [716967]  Admitting Physician: Karmen Bongo [[id number]]  Attending Physician: Karmen Bongo [[id number]]  PT Class (Do Not Modify): Observation [104]  PT Acc Code (Do Not Modify): Observation [10022]       B Medical/Surgery History Past Medical History:  Diagnosis Date  . Chronic kidney disease    Stage 4  . Depression   . Diabetes mellitus    Type I per office notes  . Hyperlipidemia   . Hypertension   . Memory loss    "short term"  . Migraine   . Stroke [facility])    TIA - no residual   Past Surgical History:  Procedure Laterality Date  . ABDOMINAL HYSTERECTOMY    . ANKLE ARTHODESIS W/ ARTHROSCOPY Left   . CATARACT EXTRACTION Bilateral   . CHOLECYSTECTOMY    . WRIST FRACTURE SURGERY       A IV Location/Drains/Wounds Patient  Lines/Drains/Airways Status   Active Line/Drains/Airways    Name:   Placement date:   Placement time:   Site:   Days:   Peripheral IV 01/10/19 Right Antecubital   01/10/19    0423    Antecubital   less than 1   Peripheral IV 01/10/19 Right Forearm   01/10/19    0542    Forearm   less than 1   External Urinary Catheter   01/10/19    0446    -   less than 1          Intake/Output Last 24 hours  Intake/Output Summary (Last 24 hours) at 01/10/2019 8938 Last data filed at 01/10/2019 0818 Gross per 24 hour  Intake 1309.62 ml  Output 450 ml  Net 859.62 ml    Labs/Imaging Results for orders placed or performed during the hospital encounter of 01/10/19 (from the past 48 hour(s))  Basic metabolic panel     Status: Abnormal   Collection Time: 01/10/19  4:13 AM  Result Value Ref Range   Sodium 125 (L) 135 - 145 mmol/L   Potassium 4.6 3.5 - 5.1 mmol/L   Chloride 86 (L) 98 - 111 mmol/L   CO2 20 (L) 22 - 32 mmol/L   Glucose, Bld 720 (HH) 70 - 99 mg/dL    Comment: CRITICAL RESULT CALLED TO, READ BACK BY AND VERIFIED WITH: [place] T,RN 01/10/19 0500 WAYK    BUN 37 (H) 8 - 23 mg/dL   Creatinine, Ser 2.24 (H) 0.44 -  1.00 mg/dL   Calcium 9.6 8.9 - 10.3 mg/dL   GFR calc non Af Amer 21 (L) >60 mL/min   GFR calc Af Amer 24 (L) >60 mL/min   Anion gap 19 (H) 5 - 15    Comment: Performed at [facility] [address] [place], [place] [address]  CBC     Status: None   Collection Time: 01/10/19  4:13 AM  Result Value Ref Range   WBC 8.0 4.0 - 10.5 K/uL   RBC 4.43 3.87 - 5.11 MIL/uL   Hemoglobin 12.8 12.0 - 15.0 g/dL   HCT 38.4 36.0 - 46.0 %   MCV 86.7 80.0 - 100.0 fL   MCH 28.9 26.0 - 34.0 pg   MCHC 33.3 30.0 - 36.0 g/dL   RDW 13.0 11.5 - 15.5 %   Platelets 247 150 - 400 K/uL   nRBC 0.0 0.0 - 0.2 %    Comment: Performed at [facility], [place] [address] [place], [place] [address]  POCT I-Stat EG7     Status: Abnormal   Collection Time: 01/10/19  5:46 AM  Result Value Ref Range    pH, Ven 7.383 7.250 - 7.430   pCO2, Ven 41.4 (L) 44.0 - 60.0 mmHg   pO2, Ven 20.0 (LL) 32.0 - 45.0 mmHg   Bicarbonate 24.7 20.0 - 28.0 mmol/L   TCO2 26 22 - 32 mmol/L   O2 Saturation 31.0 %   Sodium 124 (L) 135 - 145 mmol/L   Potassium 4.8 3.5 - 5.1 mmol/L   Calcium, Ion 1.12 (L) 1.15 - 1.40 mmol/L   HCT 37.0 36.0 - 46.0 %   Hemoglobin 12.6 12.0 - 15.0 g/dL   Patient temperature 37.0 C    Sample type VENOUS    Comment NOTIFIED PHYSICIAN   CBG monitoring, ED     Status: Abnormal   Collection Time: 01/10/19  5:49 AM  Result Value Ref Range   Glucose-Capillary >600 (HH) 70 - 99 mg/dL  Urinalysis, Routine w reflex microscopic     Status: Abnormal   Collection Time: 01/10/19  6:06 AM  Result Value Ref Range   Color, Urine YELLOW YELLOW   APPearance HAZY (A) CLEAR   Specific Gravity, Urine 1.022 1.005 - 1.030   pH 5.0 5.0 - 8.0   Glucose, UA >=500 (A) NEGATIVE mg/dL   Hgb urine dipstick NEGATIVE NEGATIVE   Bilirubin Urine NEGATIVE NEGATIVE   Ketones, ur 5 (A) NEGATIVE mg/dL   Protein, ur NEGATIVE NEGATIVE mg/dL   Nitrite NEGATIVE NEGATIVE   Leukocytes,Ua SMALL (A) NEGATIVE   RBC / HPF 0-5 0 - 5 RBC/hpf   WBC, UA >50 (H) 0 - 5 WBC/hpf   Bacteria, UA MANY (A) NONE SEEN   Squamous Epithelial / LPF 0-5 0 - 5   WBC Clumps PRESENT     Comment: Performed at [facility], [address], [place], [place] [address]  SARS Coronavirus 2 (CEPHEID - Performed in [facility] hospital lab), Hosp Order     Status: None   Collection Time: 01/10/19  6:06 AM  Result Value Ref Range   SARS Coronavirus 2 NEGATIVE NEGATIVE    Comment: (NOTE) If result is NEGATIVE SARS-CoV-2 target nucleic acids are NOT DETECTED. The SARS-CoV-2 RNA is generally detectable in upper and lower  respiratory specimens during the acute phase of infection. The lowest  concentration of SARS-CoV-2 viral copies this assay can detect is 250  copies / mL. A negative result  does not preclude SARS-CoV-2 infection  and  should not be used as the sole basis for treatment or other  patient management decisions.  A negative result may occur with  improper specimen collection / handling, submission of specimen other  than nasopharyngeal swab, presence of viral mutation(s) within the  areas targeted by this assay, and inadequate number of viral copies  (<250 copies / mL). A negative result must be combined with clinical  observations, patient history, and epidemiological information. If result is POSITIVE SARS-CoV-2 target nucleic acids are DETECTED. The SARS-CoV-2 RNA is generally detectable in upper and lower  respiratory specimens dur ing the acute phase of infection.  Positive  results are indicative of active infection with SARS-CoV-2.  Clinical  correlation with patient history and other diagnostic information is  necessary to determine patient infection status.  Positive results do  not rule out bacterial infection or co-infection with other viruses. If result is PRESUMPTIVE POSTIVE SARS-CoV-2 nucleic acids MAY BE PRESENT.   A presumptive positive result was obtained on the submitted specimen  and confirmed on repeat testing.  While 2019 novel coronavirus  (SARS-CoV-2) nucleic acids may be present in the submitted sample  additional confirmatory testing may be necessary for epidemiological  and / or clinical management purposes  to differentiate between  SARS-CoV-2 and other Sarbecovirus currently known to infect humans.  If clinically indicated additional testing with an alternate test  methodology [phone]) is advised. The SARS-CoV-2 RNA is generally  detectable in upper and lower respiratory sp ecimens during the acute  phase of infection. The expected result is Negative. Fact Sheet for Patients:  StrictlyIdeas.no Fact Sheet for Healthcare Providers: BankingDealers.co.za This test is not yet approved or cleared by the [place] FDA and has been  authorized for detection and/or diagnosis of SARS-CoV-2 by FDA under an Emergency Use Authorization (EUA).  This EUA will remain in effect (meaning this test can be used) for the duration of the COVID-19 declaration under Section 564(b)(1) of the Act, 21 U.S.C. section 360bbb-3(b)(1), unless the authorization is terminated or revoked sooner. Performed at [facility], [place] [address] [place], [place] [address]   CBG monitoring, ED     Status: Abnormal   Collection Time: 01/10/19  6:58 AM  Result Value Ref Range   Glucose-Capillary 506 (HH) 70 - 99 mg/dL  CBG monitoring, ED     Status: Abnormal   Collection Time: 01/10/19  8:02 AM  Result Value Ref Range   Glucose-Capillary 432 (H) 70 - 99 mg/dL   No results found.  Pending Labs [employer] (From admission, onward)    Start     Ordered   01/10/19 0721  Urine culture  ONCE - STAT,   STAT     01/10/19 0720          Vitals/Pain Today's Vitals   01/10/19 0703 01/10/19 0730 01/10/19 0736 01/10/19 0800  BP: (!) 143/38 106/69  (!) 113/46  Pulse: 70 64  66  Resp: 20 12  14   Temp:   99.5 F (37.5 C)   TempSrc:   Rectal   SpO2: 98% 100%  100%  Height:      PainSc:        Isolation Precautions No active isolations  Medications Medications  insulin regular, human (MYXREDLIN) 100 units/ 100 mL infusion ( Intravenous Rate/Dose Verify 01/10/19 0818)  dextrose 5 %-0.45 % sodium chloride infusion ( Intravenous Hold 01/10/19 0704)  lactated ringers infusion ( Intravenous New Bag/Given 01/10/19  0981)  sodium chloride 0.9 % bolus 1,000 mL (0 mLs Intravenous Stopped 01/10/19 1914)    And  sodium chloride 0.9 % bolus 1,000 mL (1,000 mLs Intravenous New Bag/Given 01/10/19 0653)    And  0.9 %  sodium chloride infusion ( Intravenous New Bag/Given 01/10/19 0737)  potassium chloride 10 mEq in 100 mL IVPB (0 mEq Intravenous Stopped 01/10/19 0740)  ondansetron (ZOFRAN) injection 4 mg (4 mg Intravenous Given 01/10/19 0541)  cefTRIAXone  (ROCEPHIN) 1 g in sodium chloride 0.9 % 100 mL IVPB ( Intravenous Stopped 01/10/19 0812)    Mobility walks with device High fall risk   Focused Assessments Cardiac Assessment Handoff:    Lab Results  Component Value Date   CKTOTAL 103 07/31/2011   CKMB 3.7 07/31/2011   TROPONINI <0.03 09/17/2018   Lab Results  Component Value Date   DDIMER (H) 08/14/2009    2.60        AT THE INHOUSE ESTABLISHED CUTOFF VALUE OF 0.48 ug/mL FEU, THIS ASSAY HAS BEEN DOCUMENTED IN THE LITERATURE TO HAVE A SENSITIVITY AND NEGATIVE PREDICTIVE VALUE OF AT LEAST 98 TO 99%.  THE TEST RESULT SHOULD BE CORRELATED WITH AN ASSESSMENT OF THE CLINICAL PROBABILITY OF DVT / VTE.       R Recommendations: See Admitting Provider Note  Report given to:   Additional Notes:

## 2019-01-10 NOTE — ED Provider Notes (Signed)
[facility] EMERGENCY DEPARTMENT Provider Note  CSN: [id number] Arrival date & time: 01/10/19 1191  Chief Complaint(s) Hyperglycemia  HPI Tamara Fry is a 73 y.o. female    Hyperglycemia  Blood sugar level PTA:  "High" Severity:  Moderate Onset quality:  Gradual Duration: several days. Progression:  Waxing and waning Chronicity:  Recurrent Diabetes status:  Controlled with insulin Context comment:  Thinks she might have forgotten to take her insulin due to memory loss Relieved by:  Nothing Associated symptoms: no abdominal pain, no chest pain, no dehydration, no dysuria, no fatigue, no fever, no increased appetite, no increased thirst, no nausea, no shortness of breath, no vomiting and no weakness     Remainder of history, ROS, and physical exam limited due to patient's condition (short term memory loss). Additional information was obtained from EMS and patient.   Level V Caveat.    Past Medical History Past Medical History:  Diagnosis Date  . Chronic kidney disease    Stage 4  . Depression   . Diabetes mellitus    Type I per office notes  . Hyperlipidemia   . Hypertension   . Memory loss    "short term"  . Migraine   . Stroke [facility])    TIA - no residual   Patient Active Problem List   Diagnosis Date Noted  . Essential hypertension 06/20/2015  . Mild cognitive impairment 12/18/2014  . Migraine without aura and without status migrainosus, not intractable 12/18/2014  . Benign paroxysmal positional vertigo 12/18/2014  . Vertigo 06/16/2014  . Migraine, unspecified, without mention of intractable migraine without mention of status migrainosus 04/20/2014  . Lacunar stroke ([place]) 01/03/2014  . Chest pain 07/26/2013  . Headache 07/26/2013  . Diabetes mellitus ([place]) 07/26/2013  . CVA (cerebral infarction) 07/26/2013  . TIA (transient ischemic attack) 07/25/2013  . Diabetes ([place]) 04/07/2010  . Hyperlipidemia 04/07/2010  . OBESITY 04/07/2010  .  CHRONIC PAIN SYNDROME 04/07/2010  . Hypertension 04/07/2010  . ARTHRITIS, CHRONIC 04/07/2010  . CHEST PAIN 04/07/2010  . MIGRAINES, HX OF 04/07/2010   Home Medication(s) Prior to Admission medications   Medication Sig Start Date End Date Taking? Authorizing Provider  aspirin EC 81 MG tablet Take 81 mg by mouth at bedtime.     [provider]  atorvastatin (LIPITOR) 20 MG tablet Take 1 tablet (20 mg total) by mouth daily. Patient taking differently: Take 20 mg by mouth at bedtime.  03/10/18 09/17/18  Park Liter, MD  desvenlafaxine (PRISTIQ) 50 MG 24 hr tablet Take 50 mg by mouth at bedtime.     [provider]  diphenhydrAMINE (BENADRYL) 25 MG tablet Take 25 mg by mouth at bedtime as needed (seasonal allergies).     [provider]  donepezil (ARICEPT) 10 MG tablet Take 1 tablet (10 mg total) by mouth daily. Patient taking differently: Take 10 mg by mouth at bedtime.  06/20/15   Cameron Sprang, MD  fenofibrate (TRICOR) 48 MG tablet Take 48 mg by mouth at bedtime.    [provider]  ibuprofen (ADVIL,MOTRIN) 200 MG tablet Take 200 mg by mouth at bedtime as needed for headache (pain).     [provider]  insulin lispro protamine-lispro (HUMALOG 50/50 MIX) (50-50) 100 UNIT/ML SUSP injection Inject 0.3-0.45 mLs (30-45 Units total) into the skin 2 (two) times daily before a meal. Inject 30 units every morning and 45 units every night Patient taking differently: Inject 50 Units into the skin daily before  supper.  06/17/14   Hosie Poisson, MD  losartan (COZAAR) 50 MG tablet Take 50 mg by mouth at bedtime.     [provider]  nebivolol (BYSTOLIC) 10 MG tablet Take 0.5 tablets (5 mg total) by mouth daily. Patient taking differently: Take 10 mg by mouth at bedtime.  06/17/14   Hosie Poisson, MD                                                                                                                                    Past Surgical  History Past Surgical History:  Procedure Laterality Date  . ABDOMINAL HYSTERECTOMY    . ANKLE ARTHODESIS W/ ARTHROSCOPY Left   . CATARACT EXTRACTION Bilateral   . CHOLECYSTECTOMY    . WRIST FRACTURE SURGERY     Family History Family History  Problem Relation Age of Onset  . Hypertension Mother   . Diabetes Mellitus II Father   . Prostate cancer Father     Social History Social History   Tobacco Use  . Smoking status: Former Smoker    Packs/day: 1.00    Years: 5.00    Pack years: 5.00    Types: Cigarettes  . Smokeless tobacco: Never Used  . Tobacco comment: 06/22/17- "quit at least 40 years ago"  Substance Use Topics  . Alcohol use: No    Alcohol/week: 0.0 standard drinks  . Drug use: No   Allergies Patient has no known allergies.  Review of Systems Review of Systems  Constitutional: Negative for fatigue and fever.  Respiratory: Negative for shortness of breath.   Cardiovascular: Negative for chest pain.  Gastrointestinal: Negative for abdominal pain, nausea and vomiting.  Endocrine: Negative for polydipsia.  Genitourinary: Negative for dysuria.  Neurological: Negative for weakness.   All other systems are reviewed and are negative for acute change except as noted in the HPI  Physical Exam Vital Signs  I have reviewed the triage vital signs BP (!) 143/38   Pulse 70   Resp 20   Ht 5\' 6"  (1.676 m)   SpO2 98%   BMI 30.67 kg/m   Physical Exam Vitals signs reviewed.  Constitutional:      General: She is not in acute distress.    Appearance: She is well-developed. She is not diaphoretic.  HENT:     Head: Normocephalic and atraumatic.     Nose: Nose normal.  Eyes:     General: No scleral icterus.       Right eye: No discharge.        Left eye: No discharge.     Conjunctiva/sclera: Conjunctivae normal.     Pupils: Pupils are equal, round, and reactive to light.  Neck:     Musculoskeletal: Normal range of motion and neck supple.  Cardiovascular:      Rate and Rhythm: Normal rate and regular rhythm.     Heart sounds: No murmur. No friction rub. No  gallop.   Pulmonary:     Effort: Pulmonary effort is normal. No respiratory distress.     Breath sounds: Normal breath sounds. No stridor. No rales.  Abdominal:     General: There is no distension.     Palpations: Abdomen is soft.     Tenderness: There is no abdominal tenderness.  Musculoskeletal:        General: No tenderness.  Skin:    General: Skin is warm and dry.     Findings: No erythema or rash.  Neurological:     Mental Status: She is alert and oriented to person, place, and time.     ED Results and Treatments Labs (all labs ordered are listed, but only abnormal results are displayed) Labs Reviewed  BASIC METABOLIC PANEL - Abnormal; Notable for the following components:      Result Value   Sodium 125 (*)    Chloride 86 (*)    CO2 20 (*)    Glucose, Bld 720 (*)    BUN 37 (*)    Creatinine, Ser 2.24 (*)    GFR calc non Af Amer 21 (*)    GFR calc Af Amer 24 (*)    Anion gap 19 (*)    All other components within normal limits  URINALYSIS, ROUTINE W REFLEX MICROSCOPIC - Abnormal; Notable for the following components:   APPearance HAZY (*)    Glucose, UA >=500 (*)    Ketones, ur 5 (*)    Leukocytes,Ua SMALL (*)    WBC, UA >50 (*)    Bacteria, UA MANY (*)    All other components within normal limits  CBG MONITORING, ED - Abnormal; Notable for the following components:   Glucose-Capillary >600 (*)    All other components within normal limits  POCT I-STAT EG7 - Abnormal; Notable for the following components:   pCO2, Ven 41.4 (*)    pO2, Ven 20.0 (*)    Sodium 124 (*)    Calcium, Ion 1.12 (*)    All other components within normal limits  CBG MONITORING, ED - Abnormal; Notable for the following components:   Glucose-Capillary 506 (*)    All other components within normal limits  SARS CORONAVIRUS 2 (HOSPITAL ORDER, [facility])  URINE CULTURE   CBC  CBG MONITORING, ED                                                                                                                         EKG  EKG Interpretation  Date/Time:  Wednesday Jan 10 2019 04:36:25 EDT Ventricular Rate:  77 PR Interval:    QRS Duration: 135 QT Interval:  406 QTC Calculation: 460 R Axis:   -22 Text Interpretation:  Sinus rhythm Left bundle branch block No significant change since last tracing Confirmed by Addison Lank ([id number]) on 01/10/2019 4:38:07 AM      Radiology No results found. Pertinent labs & imaging results that were available during my care  of the patient were reviewed by me and considered in my medical decision making (see chart for details).  Medications Ordered in ED Medications  insulin regular, human (MYXREDLIN) 100 units/ 100 mL infusion (4.5 Units/hr Intravenous Rate/Dose Change 01/10/19 0700)  dextrose 5 %-0.45 % sodium chloride infusion ( Intravenous Hold 01/10/19 0704)  lactated ringers infusion (has no administration in time range)  sodium chloride 0.9 % bolus 1,000 mL (0 mLs Intravenous Stopped 01/10/19 0652)    And  sodium chloride 0.9 % bolus 1,000 mL (1,000 mLs Intravenous New Bag/Given 01/10/19 0653)    And  0.9 %  sodium chloride infusion (has no administration in time range)  potassium chloride 10 mEq in 100 mL IVPB (10 mEq Intravenous New Bag/Given 01/10/19 0654)  cefTRIAXone (ROCEPHIN) 1 g in sodium chloride 0.9 % 100 mL IVPB (has no administration in time range)  ondansetron (ZOFRAN) injection 4 mg (4 mg Intravenous Given 01/10/19 0541)                                                                                                                                    Procedures .Critical Care Performed by: Fatima Blank, MD Authorized by: Fatima Blank, MD     CRITICAL CARE Performed by: Grayce Sessions Cardama Total critical care time: 40 minutes Critical care time was exclusive of separately billable  procedures and treating other patients. Critical care was necessary to treat or prevent imminent or life-threatening deterioration. Critical care was time spent personally by me on the following activities: development of treatment plan with patient and/or surrogate as well as nursing, discussions with consultants, evaluation of patient's response to treatment, examination of patient, obtaining history from patient or surrogate, ordering and performing treatments and interventions, ordering and review of laboratory studies, ordering and review of radiographic studies, pulse oximetry and re-evaluation of patient's condition.  (including critical care time)  Medical Decision Making / ED Course I have reviewed the nursing notes for this encounter and the patient's prior records (if available in EHR or on provided paperwork).    Patient with significant hyperglycemia.  Evidence of early DKA.  Also found to have urinary tract infection.  Patient started on IV fluids and insulin drip.  Given her short-term memory loss and inability to manage her home medicine, will discuss case with medicine for admission and continued management.  Final Clinical Impression(s) / ED Diagnoses Final diagnoses:  Hyperglycemia  Lower urinary tract infectious disease      This chart was dictated using voice recognition software.  Despite best efforts to proofread,  errors can occur which can change the documentation meaning.   Fatima Blank, MD 01/10/19 [phone]

## 2019-01-11 DIAGNOSIS — E1065 Type 1 diabetes mellitus with hyperglycemia: Secondary | ICD-10-CM

## 2019-01-11 DIAGNOSIS — N184 Chronic kidney disease, stage 4 (severe): Secondary | ICD-10-CM

## 2019-01-11 DIAGNOSIS — E782 Mixed hyperlipidemia: Secondary | ICD-10-CM

## 2019-01-11 DIAGNOSIS — R4189 Other symptoms and signs involving cognitive functions and awareness: Secondary | ICD-10-CM

## 2019-01-11 DIAGNOSIS — I10 Essential (primary) hypertension: Secondary | ICD-10-CM | POA: Diagnosis not present

## 2019-01-11 LAB — CBC
HCT: 32.6 % — ABNORMAL LOW (ref 36.0–46.0)
Hemoglobin: 10.7 g/dL — ABNORMAL LOW (ref 12.0–15.0)
MCH: 29 pg (ref 26.0–34.0)
MCHC: 32.8 g/dL (ref 30.0–36.0)
MCV: 88.3 fL (ref 80.0–100.0)
Platelets: 149 10*3/uL — ABNORMAL LOW (ref 150–400)
RBC: 3.69 MIL/uL — ABNORMAL LOW (ref 3.87–5.11)
RDW: 13.2 % (ref 11.5–15.5)
WBC: 6.1 10*3/uL (ref 4.0–10.5)
nRBC: 0 % (ref 0.0–0.2)

## 2019-01-11 LAB — BASIC METABOLIC PANEL
Anion gap: 8 (ref 5–15)
BUN: 27 mg/dL — ABNORMAL HIGH (ref 8–23)
CO2: 21 mmol/L — ABNORMAL LOW (ref 22–32)
Calcium: 8.3 mg/dL — ABNORMAL LOW (ref 8.9–10.3)
Chloride: 107 mmol/L (ref 98–111)
Creatinine, Ser: 1.84 mg/dL — ABNORMAL HIGH (ref 0.44–1.00)
GFR calc Af Amer: 31 mL/min — ABNORMAL LOW (ref >60)
GFR calc non Af Amer: 27 mL/min — ABNORMAL LOW (ref >60)
Glucose, Bld: 350 mg/dL — ABNORMAL HIGH (ref 70–99)
Potassium: 4.4 mmol/L (ref 3.5–5.1)
Sodium: 136 mmol/L (ref 135–145)

## 2019-01-11 LAB — GLUCOSE, CAPILLARY
Glucose-Capillary: 303 mg/dL — ABNORMAL HIGH (ref 70–99)
Glucose-Capillary: 233 mg/dL — ABNORMAL HIGH (ref 70–99)
Glucose-Capillary: 231 mg/dL — ABNORMAL HIGH (ref 70–99)

## 2019-01-11 MED ORDER — CEFDINIR 300 MG PO CAPS: 300.0000 mg | ORAL_CAPSULE | Freq: Every day | ORAL | 0 refills | Status: AC

## 2019-01-11 MED ORDER — SODIUM CHLORIDE 0.9 % IV SOLN
1.0000 g | INTRAVENOUS | Status: DC
  Administered 2019-01-11: 1 g via INTRAVENOUS
  Filled 2019-01-11: qty 10

## 2019-01-11 MED ORDER — SITAGLIPTIN PHOSPHATE 25 MG PO TABS: 25.0000 mg | ORAL_TABLET | Freq: Every day | ORAL | 11 refills | Status: DC

## 2019-01-11 MED ORDER — INSULIN GLARGINE 100 UNIT/ML ~~LOC~~ SOLN: 20.0000 [IU] | Freq: Every day | SUBCUTANEOUS | 11 refills | Status: DC

## 2019-01-11 NOTE — TOC Initial Note (Signed)
Transition of Care [facility]) - Initial/Assessment Note    Patient Details  Name: Tamara Fry MRN: [id number] Date of Birth: Nov 29, 1945  [facility]) CM/SW Contact:    Pollie Friar, RN Phone Number: 01/11/2019, 4:53 PM  Clinical Narrative:                   Expected Discharge Plan: Home w Home Health Services Barriers to Discharge: Barriers Resolved   Patient Goals and CMS Choice   CMS Medicare.gov Compare Post Acute Care list provided to:: Patient Represenative (must comment)(son--Paul) Choice offered to / list presented to : Adult Children  Expected Discharge Plan and Services Expected Discharge Plan: [place]         Expected Discharge Date: 01/11/19                         HH Arranged: RN [place] Agency: [facility] (formerly [employer]) Date [place]: 01/11/19 Time [place]: [place] Representative spoke with at [place]: [place] Arrangements/Services   Lives with:: Self Patient language and need for interpreter reviewed:: Yes(no needs) Do you feel safe going back to the place where you live?: Yes      Need for Family Participation in Patient Care: Yes (Comment) Care giver support system in place?: No (comment)(intermittent supervision and support) Current home services: DME(rolling walker)    Activities of Daily Living      Permission Sought/Granted                  Emotional Assessment   Attitude/Demeanor/Rapport: Aggressive (Verbally and/or physically) Affect (typically observed): Angry, Agitated Orientation: : Oriented to Self   Psych Involvement: No (comment)  Admission diagnosis:  Lower urinary tract infectious disease [N39.0] Hyperglycemia [R73.9] Uncontrolled diabetes mellitus ([place]) [E11.65] Patient Active Problem List   Diagnosis Date Noted  . Uncontrolled diabetes mellitus ([place]) 01/10/2019  . CKD (chronic kidney disease) stage 4, GFR 15-29 ml/min (HCC)  01/10/2019  . Essential hypertension 06/20/2015  . Persistent cognitive impairment 12/18/2014  . Migraine without aura and without status migrainosus, not intractable 12/18/2014  . Benign paroxysmal positional vertigo 12/18/2014  . Vertigo 06/16/2014  . Migraine, unspecified, without mention of intractable migraine without mention of status migrainosus 04/20/2014  . Lacunar stroke ([place]) 01/03/2014  . Chest pain 07/26/2013  . Headache 07/26/2013  . Diabetes mellitus ([place]) 07/26/2013  . CVA (cerebral infarction) 07/26/2013  . TIA (transient ischemic attack) 07/25/2013  . Diabetes ([place]) 04/07/2010  . Hyperlipidemia 04/07/2010  . OBESITY 04/07/2010  . Chronic pain syndrome 04/07/2010  . Hypertension 04/07/2010  . ARTHRITIS, CHRONIC 04/07/2010  . CHEST PAIN 04/07/2010  . MIGRAINES, HX OF 04/07/2010   PCP:  Jani Gravel, MD Pharmacy:   [facility], [address] [place] [address] Phone: [phone] Fax: [phone]     Social Determinants of Health (SDOH) Interventions    Readmission Risk Interventions No flowsheet data found.

## 2019-01-11 NOTE — Social Work (Signed)
CSW acknowledging consult for SNF placement. Will follow for therapy recommendations.   Staphanie Harbison, MSW, LCSWA [place] Clinical Social Work [phone]   

## 2019-01-11 NOTE — Discharge Summary (Addendum)
Physician Discharge Summary  Tamara Fry ZJI:[id number] DOB: 1945/12/07 DOA: 01/10/2019  PCP: Jani Gravel, MD  Admit date: 01/10/2019 Discharge date: 01/11/2019  Admitted From: Observation Disposition: home  Recommendations for Outpatient Follow-up:  1. Follow up with PCP in 1-2 weeks 2. Please obtain BMP/CBC in one week   Home Health:Yes Equipment/Devices:rolling walker Discharge Condition:Stable CODE STATUS:Full code Diet recommendation: Diabetic diet  Brief/Interim Summary: This is a 73 year old white female with a past medical history of TIA, dementia, hypertension, hyperlipidemia, and insulin-dependent diabetes will stage IV CKD who presented to the ER with hyperglycemia.  Patient reports she has missed doses intermittently of her insulin medications and presented with an ER blood sugar noted to be 720 with a pre-DKA.  She was sent to the hospital service and admitted for further eval and treatment. While in the hospital patient's insulin dose was adjusted.  She was seen by diabetes consultant in consultation with recommendations for Lantus 1 dose of 130 units daily secondary to patient's difficulty with adherence to more complex regimen.  Patient will also be placed on Januvia renally dosed on a daily basis.  She as well as family were instructed on the importance of continued medical management through her PCP for adjusting of dosing.  I discussed the case with her son Eddie Dibbles as did the diabetic educator.  They agreed that they will supervise the patient is giving herself a shot each day and taking her p.o. antibiotic.  The diabetic regimen was adjusted as above to help facilitate this management.  Family indicated they were unable to find additional medication supervision.  They also refused skilled nursing facility for the patient.  They are in agreement with plan of care. In the hospital patient was noted to have a UTI with a urine culture gram-negative rods.  She was discharged on p.o.  antibiotics to complete 5 days.  She will continue her home medications on discharge to be titrated and adjusted for renal impairment.  With further evaluation by her PCP.  Discharge Diagnoses:  Principal Problem:   Uncontrolled diabetes mellitus ([place]) Active Problems:   Hyperlipidemia   Persistent cognitive impairment   Essential hypertension   CKD (chronic kidney disease) stage 4, GFR 15-29 ml/min [facility])    Discharge Instructions  Discharge Instructions    Call MD for:   Complete by:  As directed    For any acute change in medical condition.   Diet Carb Modified   Complete by:  As directed    Increase activity slowly   Complete by:  As directed      Allergies as of 01/11/2019   No Known Allergies     Medication List    STOP taking these medications   insulin lispro protamine-lispro (50-50) 100 UNIT/ML Susp injection Commonly known as:  HUMALOG 50/50 MIX     TAKE these medications   aspirin EC 81 MG tablet Take 81 mg by mouth at bedtime.   atorvastatin 20 MG tablet Commonly known as:  LIPITOR Take 1 tablet (20 mg total) by mouth daily.   cefdinir 300 MG capsule Commonly known as:  OMNICEF Take 1 capsule (300 mg total) by mouth daily for 5 days.   desvenlafaxine 50 MG 24 hr tablet Commonly known as:  PRISTIQ Take 50 mg by mouth at bedtime.   diphenhydrAMINE 25 MG tablet Commonly known as:  BENADRYL Take 25 mg by mouth at bedtime as needed (seasonal allergies).   donepezil 10 MG tablet Commonly known as:  ARICEPT Take  1 tablet (10 mg total) by mouth daily. What changed:  when to take this   fenofibrate 48 MG tablet Commonly known as:  TRICOR Take 48 mg by mouth at bedtime.   ibuprofen 200 MG tablet Commonly known as:  ADVIL Take 200 mg by mouth at bedtime as needed for headache (pain).   insulin glargine 100 UNIT/ML injection Commonly known as:  LANTUS Inject 0.2 mLs (20 Units total) into the skin daily. Start taking on:  Jan 12, 2019   losartan  50 MG tablet Commonly known as:  COZAAR Take 50 mg by mouth at bedtime.   nebivolol 10 MG tablet Commonly known as:  BYSTOLIC Take 0.5 tablets (5 mg total) by mouth daily. What changed:    how much to take  when to take this   sitaGLIPtin 25 MG tablet Commonly known as:  Januvia Take 1 tablet (25 mg total) by mouth daily.       No Known Allergies  Consultations:  diabetes educator   Procedures/Studies:  No results found.    Subjective: Patient made it clear she did not want to stay in the hospital though she was at her baseline.  Discharge Exam: Vitals:   01/11/19 0423 01/11/19 0853  BP: (!) 153/75 (!) 132/115  Pulse: 79 (!) 111  Resp: 16 18  Temp: 98.6 F (37 C) 98.3 F (36.8 C)  SpO2: 100% 100%   Vitals:   01/10/19 2126 01/11/19 0423 01/11/19 0513 01/11/19 0853  BP: (!) 108/47 (!) 153/75  (!) 132/115  Pulse: 74 79  (!) 111  Resp: 14 16  18   Temp: 98.9 F (37.2 C) 98.6 F (37 C)  98.3 F (36.8 C)  TempSrc: Oral Oral  Oral  SpO2: 100% 100%  100%  Weight:   83.6 kg   Height:        General: Pt is alert, awake, not in acute distress, somewhat oppositional Cardiovascular: RRR, S1/S2 +, no rubs, no gallops Respiratory: CTA bilaterally, no wheezing, no rhonchi Abdominal: Soft, NT, ND, bowel sounds + Extremities: no edema, no cyanosis    The results of significant diagnostics from this hospitalization (including imaging, microbiology, ancillary and laboratory) are listed below for reference.     Microbiology: Recent Results (from the past 240 hour(s))  SARS Coronavirus 2 (CEPHEID - Performed in [place] hospital lab), Hosp Order     Status: None   Collection Time: 01/10/19  6:06 AM  Result Value Ref Range Status   SARS Coronavirus 2 NEGATIVE NEGATIVE Final    Comment: (NOTE) If result is NEGATIVE SARS-CoV-2 target nucleic acids are NOT DETECTED. The SARS-CoV-2 RNA is generally detectable in upper and lower  respiratory specimens during  the acute phase of infection. The lowest  concentration of SARS-CoV-2 viral copies this assay can detect is 250  copies / mL. A negative result does not preclude SARS-CoV-2 infection  and should not be used as the sole basis for treatment or other  patient management decisions.  A negative result may occur with  improper specimen collection / handling, submission of specimen other  than nasopharyngeal swab, presence of viral mutation(s) within the  areas targeted by this assay, and inadequate number of viral copies  (<250 copies / mL). A negative result must be combined with clinical  observations, patient history, and epidemiological information. If result is POSITIVE SARS-CoV-2 target nucleic acids are DETECTED. The SARS-CoV-2 RNA is generally detectable in upper and lower  respiratory specimens dur ing the acute phase  of infection.  Positive  results are indicative of active infection with SARS-CoV-2.  Clinical  correlation with patient history and other diagnostic information is  necessary to determine patient infection status.  Positive results do  not rule out bacterial infection or co-infection with other viruses. If result is PRESUMPTIVE POSTIVE SARS-CoV-2 nucleic acids MAY BE PRESENT.   A presumptive positive result was obtained on the submitted specimen  and confirmed on repeat testing.  While 2019 novel coronavirus  (SARS-CoV-2) nucleic acids may be present in the submitted sample  additional confirmatory testing may be necessary for epidemiological  and / or clinical management purposes  to differentiate between  SARS-CoV-2 and other Sarbecovirus currently known to infect humans.  If clinically indicated additional testing with an alternate test  methodology [phone]) is advised. The SARS-CoV-2 RNA is generally  detectable in upper and lower respiratory sp ecimens during the acute  phase of infection. The expected result is Negative. Fact Sheet for Patients:   StrictlyIdeas.no Fact Sheet for Healthcare Providers: BankingDealers.co.za This test is not yet approved or cleared by the [place] FDA and has been authorized for detection and/or diagnosis of SARS-CoV-2 by FDA under an Emergency Use Authorization (EUA).  This EUA will remain in effect (meaning this test can be used) for the duration of the COVID-19 declaration under Section 564(b)(1) of the Act, 21 U.S.C. section 360bbb-3(b)(1), unless the authorization is terminated or revoked sooner. Performed at [facility], [place] [address], [place], [place] [address]   Urine culture     Status: Abnormal (Preliminary result)   Collection Time: 01/10/19  7:21 AM  Result Value Ref Range Status   Specimen Description URINE, RANDOM  Final   Special Requests NONE  Final   Culture (A)  Final    >=100,000 COLONIES/mL KLEBSIELLA PNEUMONIAE SUSCEPTIBILITIES TO FOLLOW Performed at [facility], [place] [address], [place],  [address]    Report Status PENDING  Incomplete     Labs: BNP (last 3 results) No results for input(s): BNP in the last 8760 hours. Basic Metabolic Panel: Recent Labs  Lab 01/10/19 0413 01/10/19 0546 01/10/19 0918 01/11/19 0500  NA 125* 124* 135 136  K 4.6 4.8 4.3 4.4  CL 86*  --  102 107  CO2 20*  --  19* 21*  GLUCOSE 720*  --  213* 350*  BUN 37*  --  30* 27*  CREATININE 2.24*  --  1.77* 1.84*  CALCIUM 9.6  --  8.6* 8.3*   Liver Function Tests: No results for input(s): AST, ALT, ALKPHOS, BILITOT, PROT, ALBUMIN in the last 168 hours. No results for input(s): LIPASE, AMYLASE in the last 168 hours. No results for input(s): AMMONIA in the last 168 hours. CBC: Recent Labs  Lab 01/10/19 0413 01/10/19 0546 01/11/19 0500  WBC 8.0  --  6.1  HGB 12.8 12.6 10.7*  HCT 38.4 37.0 32.6*  MCV 86.7  --  88.3  PLT 247  --  149*   Cardiac Enzymes: No results for input(s): CKTOTAL, CKMB, CKMBINDEX, TROPONINI in  the last 168 hours. BNP: Invalid input(s): POCBNP CBG: Recent Labs  Lab 01/10/19 1110 01/10/19 1627 01/10/19 2122 01/11/19 0712 01/11/19 1112  GLUCAP 183* 202* 306* 303* 233*   D-Dimer No results for input(s): DDIMER in the last 72 hours. Hgb A1c Recent Labs    01/10/19 0413  HGBA1C 14.1*   Lipid Profile No results for input(s): CHOL, HDL, LDLCALC, TRIG, CHOLHDL, LDLDIRECT in the last 72  hours. Thyroid function studies No results for input(s): TSH, T4TOTAL, T3FREE, THYROIDAB in the last 72 hours.  Invalid input(s): FREET3 Anemia work up No results for input(s): VITAMINB12, FOLATE, FERRITIN, TIBC, IRON, RETICCTPCT in the last 72 hours. Urinalysis    Component Value Date/Time   COLORURINE YELLOW 01/10/2019 0606   APPEARANCEUR HAZY (A) 01/10/2019 0606   LABSPEC 1.022 01/10/2019 0606   PHURINE 5.0 01/10/2019 0606   GLUCOSEU >=500 (A) 01/10/2019 0606   HGBUR NEGATIVE 01/10/2019 0606   BILIRUBINUR NEGATIVE 01/10/2019 0606   KETONESUR 5 (A) 01/10/2019 0606   PROTEINUR NEGATIVE 01/10/2019 0606   UROBILINOGEN 1.0 06/16/2014 1827   NITRITE NEGATIVE 01/10/2019 0606   LEUKOCYTESUR SMALL (A) 01/10/2019 0606   Sepsis Labs Invalid input(s): PROCALCITONIN,  WBC,  LACTICIDVEN Microbiology Recent Results (from the past 240 hour(s))  SARS Coronavirus 2 (CEPHEID - Performed in [place] hospital lab), Hosp Order     Status: None   Collection Time: 01/10/19  6:06 AM  Result Value Ref Range Status   SARS Coronavirus 2 NEGATIVE NEGATIVE Final    Comment: (NOTE) If result is NEGATIVE SARS-CoV-2 target nucleic acids are NOT DETECTED. The SARS-CoV-2 RNA is generally detectable in upper and lower  respiratory specimens during the acute phase of infection. The lowest  concentration of SARS-CoV-2 viral copies this assay can detect is 250  copies / mL. A negative result does not preclude SARS-CoV-2 infection  and should not be used as the sole basis for treatment or other  patient  management decisions.  A negative result may occur with  improper specimen collection / handling, submission of specimen other  than nasopharyngeal swab, presence of viral mutation(s) within the  areas targeted by this assay, and inadequate number of viral copies  (<250 copies / mL). A negative result must be combined with clinical  observations, patient history, and epidemiological information. If result is POSITIVE SARS-CoV-2 target nucleic acids are DETECTED. The SARS-CoV-2 RNA is generally detectable in upper and lower  respiratory specimens dur ing the acute phase of infection.  Positive  results are indicative of active infection with SARS-CoV-2.  Clinical  correlation with patient history and other diagnostic information is  necessary to determine patient infection status.  Positive results do  not rule out bacterial infection or co-infection with other viruses. If result is PRESUMPTIVE POSTIVE SARS-CoV-2 nucleic acids MAY BE PRESENT.   A presumptive positive result was obtained on the submitted specimen  and confirmed on repeat testing.  While 2019 novel coronavirus  (SARS-CoV-2) nucleic acids may be present in the submitted sample  additional confirmatory testing may be necessary for epidemiological  and / or clinical management purposes  to differentiate between  SARS-CoV-2 and other Sarbecovirus currently known to infect humans.  If clinically indicated additional testing with an alternate test  methodology [phone]) is advised. The SARS-CoV-2 RNA is generally  detectable in upper and lower respiratory sp ecimens during the acute  phase of infection. The expected result is Negative. Fact Sheet for Patients:  StrictlyIdeas.no Fact Sheet for Healthcare Providers: BankingDealers.co.za This test is not yet approved or cleared by the [place] FDA and has been authorized for detection and/or diagnosis of SARS-CoV-2 by FDA under  an Emergency Use Authorization (EUA).  This EUA will remain in effect (meaning this test can be used) for the duration of the COVID-19 declaration under Section 564(b)(1) of the Act, 21 U.S.C. section 360bbb-3(b)(1), unless the authorization is terminated or revoked sooner. Performed at [facility], [address], [place], [place] [address]   Urine culture     Status: Abnormal (Preliminary result)   Collection Time: 01/10/19  7:21 AM  Result Value Ref Range Status   Specimen Description URINE, RANDOM  Final   Special Requests NONE  Final   Culture (A)  Final    >=100,000 COLONIES/mL KLEBSIELLA PNEUMONIAE SUSCEPTIBILITIES TO FOLLOW Performed at [facility], [place] [address], [place], [place] [address]    Report Status PENDING  Incomplete     Time coordinating discharge: 35 minutes  SIGNED:   Nicolette Bang, MD  [facility] 01/11/2019, 3:11 PM Pager   If 7PM-7AM, please contact night-coverage [email or web address] Password [email or web address]

## 2019-01-11 NOTE — Plan of Care (Signed)
Physical Therapy Treatment Patient Details Name: Tamara Fry MRN: [id number] DOB: 10-06-1945 Today's Date: 01/11/2019    History of Present Illness Patient is a 73 year old female who presents to the hospital with hyperglycemia and a lower urinary tract infection. She lives alone and sons report she is having increasing difficulty caring for herself. PMH: Stroke, Memory loss, Hypertension, DMII, CKD    PT Comments    Patient required guarding for all transfers and mobility. She will likely require 24 hour assistance if she is to go home. She may benefit from rehab a t SNF to improve safety with mobility. Skilled acute therapy will continue to follow.    Follow Up Recommendations  SNF;Home health PT     Equipment Recommendations  Rolling walker with 5" wheels    Recommendations for Other Services Rehab consult     Precautions / Restrictions Precautions Precautions: None Restrictions Weight Bearing Restrictions: No    Mobility  Bed Mobility Overal bed mobility: Modified Independent             General bed mobility comments: Patient sat up at the edge of the bed without assistance. She was then able to transfer back in and coot herself up   Transfers Overall transfer level: Needs assistance Equipment used: None Transfers: Sit to/from Stand Sit to Stand: Min guard         General transfer comment: Min gaurd for initial standing balance   Ambulation/Gait Ambulation/Gait assistance: Min guard Gait Distance (Feet): 75 Feet Assistive device: Rolling walker (2 wheeled) Gait Pattern/deviations: Step-to pattern;Drifts right/left Gait velocity: decreased   General Gait Details: Patient tended to drift left and right with gait. She did not loose balance thiugh.    Stairs             Wheelchair Mobility    Modified Rankin (Stroke Patients Only)       Balance Overall balance assessment: Needs assistance Sitting-balance support: No upper extremity  supported Sitting balance-Leahy Scale: Good     Standing balance support: No upper extremity supported Standing balance-Leahy Scale: Fair Standing balance comment: decreased inital standing balance                             Cognition Arousal/Alertness: Awake/alert Behavior During Therapy: WFL for tasks assessed/performed Overall Cognitive Status: Impaired/Different from baseline Area of Impairment: Safety/judgement                         Safety/Judgement: Decreased awareness of safety     General Comments: At times required cuing for safety with IV line. Patient appeared to be AOx3       Exercises      General Comments        Pertinent Vitals/Pain Pain Assessment: No/denies pain    Home Living Family/patient expects to be discharged to:: Private residence Living Arrangements: Alone Available Help at Discharge: Family Type of Home: House Home Access: Level entry       Additional Comments: Patient reports her mother lives fdown the street.     Prior Function Level of Independence: Independent      Comments: Patient deinies falls or the use of a walker    PT Goals (current goals can now be found in the care plan section) Acute Rehab PT Goals Patient Stated Goal: to go home PT Goal Formulation: With patient/family Time For Goal Achievement: 01/18/19 Potential to Achieve Goals: Good  Frequency    Min 2X/week      PT Plan      Co-evaluation              AM-PAC PT "6 Clicks" Mobility   Outcome Measure  Help needed turning from your back to your side while in a flat bed without using bedrails?: A Little Help needed moving from lying on your back to sitting on the side of a flat bed without using bedrails?: A Little Help needed moving to and from a bed to a chair (including a wheelchair)?: A Little Help needed standing up from a chair using your arms (e.g., wheelchair or bedside chair)?: A Little Help needed to walk in  hospital room?: A Little Help needed climbing 3-5 steps with a railing? : A Lot 6 Click Score: 17    End of Session Equipment Utilized During Treatment: Gait belt Activity Tolerance: Patient tolerated treatment well Patient left: in bed;with call bell/phone within reach;with bed alarm set Nurse Communication: Mobility status PT Visit Diagnosis: Unsteadiness on feet (R26.81);Difficulty in walking, not elsewhere classified (R26.2)     Time: 4656-8127 PT Time Calculation (min) (ACUTE ONLY): 21 min  Charges:                           Carney Living PT DPT  01/11/2019, 1:49 PM

## 2019-01-11 NOTE — TOC Transition Note (Signed)
Transition of Care [facility]) - CM/SW Discharge Note   Patient Details  Name: Tamara Fry MRN: [id number] Date of Birth: 06-29-1946  [facility] CM/SW Contact:  Pollie Friar, RN Phone Number: 01/11/2019, 4:53 PM   Clinical Narrative:    Pt refused SNF and refused HH. CM spoke to son, Eddie Dibbles and he would like HH. He will discuss with patient. Pt set up with [facility]. Pt has walker at home.  Son to provide transportation to home. Bedside RN to call when pt is ready.    Final next level of care: Home w Home Health Services Barriers to Discharge: Barriers Resolved   Patient Goals and CMS Choice   CMS Medicare.gov Compare Post Acute Care list provided to:: Patient Represenative (must comment)(son--Paul) Choice offered to / list presented to : Adult Children  Discharge Placement                       Discharge Plan and Services                          HH Arranged: RN [facility] Agency: [facility] Date [place]: 01/11/19 Time [place]: [place] Representative spoke with at [place]: [place] ([place]) Interventions     Readmission Risk Interventions No flowsheet data found.

## 2019-01-11 NOTE — Progress Notes (Signed)
DISCHARGE NOTE HOME Tamara Fry to be discharged to home per MD order. Discussed prescriptions and follow up appointments with the patient. Prescriptions given to patient; medication list explained in detail. Patient verbalized understanding.Called ,Eddie Dibbles for her ride.  Skin clean, dry and intact without evidence of skin break down, no evidence of skin tears noted. IV catheter discontinued intact. Site without signs and symptoms of complications. Dressing and pressure applied. Pt denies pain at the site currently. No complaints noted.  Patient free of lines, drains, and wounds.   An After Visit Summary (AVS) was printed and given to the patient. Patient escorted via wheelchair, and discharged home via private auto.  [place], Zenon Mayo, RN

## 2019-01-11 NOTE — Progress Notes (Addendum)
Inpatient Diabetes Program Recommendations  AACE/ADA: New Consensus Statement on Inpatient Glycemic Control (2015)  Target Ranges:  Prepandial:   less than 140 mg/dL      Peak postprandial:   less than 180 mg/dL (1-2 hours)      Critically ill patients:  140 - 180 mg/dL   Lab Results  Component Value Date   GLUCAP 233 (H) 01/11/2019   HGBA1C 14.1 (H) 01/10/2019    Review of Glycemic Control Results for LAKELYN, STRAUS (MRN [id number]) as of 01/11/2019 11:08  Ref. Range 01/10/2019 21:22 01/11/2019 07:12 01/11/2019 11:12  Glucose-Capillary Latest Ref Range: 70 - 99 mg/dL 306 (H) 303 (H) 233 (H)   History: DM, Dementia, CKD 4  Home DM Meds: Humalog 50/50 Insulin                             Patient states she takes 50 units QPM with supper                             Records indicate she is supposed to take 30 units in the AM and 45 units in the PM  Current Orders: Lantus 15 units QD, Novolog 0-15 units TID, Novolog 0-5 units QHS   Inpatient Diabetes Program Recommendations:    Consider increasing Lantus to 20 units QD. Appears patient has not yet received basal dose this AM. Secure chat sent to MD.  Plan to reach out to family today.  Addendum @ 31: Spoke with patient's son regarding outpatient diabetes management. He states, "I am aware that my mother has been progressively more forgetful at taking her insulin. I really feel a home health nurse would be beneficial for our family. I don't do needles and I am not giving her injections." Reviewed patient's current A1c of 14%. Explained what a A1c is and what it measures. Also reviewed goal A1c with patient, importance of good glucose control @ home, and blood sugar goals. Reviewed patho of DM, need for insulin, role of pancreas, DKA, vascular changes and comorbidites. Patient lives alone and has become more and more forgetful. Son mentions other family members that may be willing to help, but nothing consistent. We discussed SNF and he is  not interested in this due to [place] and his mother is refusing. We did discuss that home health may not be approved for every day of the week, but CM will follow. Also, we discussed if she has the ability to still perform injections when reminded, he responded with "sometimes, but I cannot be sure unless I am there with her. Mornings are going to be difficult to have anyone given injections. But the afternoon, we could manage someway."  Will ensure CM and social work consult are placed. Will also send patient's son information via email to begin process of education. Will continue to follow. Will reach out to MD to discuss plan for insulin at discharge.  Thanks, Bronson Curb, MSN, RNC-OB Diabetes Coordinator [phone] (8a-5p)

## 2019-01-12 LAB — URINE CULTURE: Culture: >=100000 — AB

## 2019-01-16 DIAGNOSIS — I129 Hypertensive chronic kidney disease with stage 1 through stage 4 chronic kidney disease, or unspecified chronic kidney disease: Secondary | ICD-10-CM | POA: Diagnosis not present

## 2019-01-16 DIAGNOSIS — N184 Chronic kidney disease, stage 4 (severe): Secondary | ICD-10-CM | POA: Diagnosis not present

## 2019-01-16 DIAGNOSIS — G43909 Migraine, unspecified, not intractable, without status migrainosus: Secondary | ICD-10-CM | POA: Diagnosis not present

## 2019-01-16 DIAGNOSIS — B9689 Other specified bacterial agents as the cause of diseases classified elsewhere: Secondary | ICD-10-CM | POA: Diagnosis not present

## 2019-01-16 DIAGNOSIS — Z87891 Personal history of nicotine dependence: Secondary | ICD-10-CM | POA: Diagnosis not present

## 2019-01-16 DIAGNOSIS — N390 Urinary tract infection, site not specified: Secondary | ICD-10-CM | POA: Diagnosis not present

## 2019-01-16 DIAGNOSIS — E1065 Type 1 diabetes mellitus with hyperglycemia: Secondary | ICD-10-CM | POA: Diagnosis not present

## 2019-01-16 DIAGNOSIS — Z8673 Personal history of transient ischemic attack (TIA), and cerebral infarction without residual deficits: Secondary | ICD-10-CM | POA: Diagnosis not present

## 2019-01-16 DIAGNOSIS — E785 Hyperlipidemia, unspecified: Secondary | ICD-10-CM | POA: Diagnosis not present

## 2019-01-16 DIAGNOSIS — E1022 Type 1 diabetes mellitus with diabetic chronic kidney disease: Secondary | ICD-10-CM | POA: Diagnosis not present

## 2019-01-17 DIAGNOSIS — N184 Chronic kidney disease, stage 4 (severe): Secondary | ICD-10-CM | POA: Diagnosis not present

## 2019-01-17 DIAGNOSIS — I10 Essential (primary) hypertension: Secondary | ICD-10-CM | POA: Diagnosis not present

## 2019-01-17 DIAGNOSIS — Z09 Encounter for follow-up examination after completed treatment for conditions other than malignant neoplasm: Secondary | ICD-10-CM | POA: Diagnosis not present

## 2019-01-17 DIAGNOSIS — E1165 Type 2 diabetes mellitus with hyperglycemia: Secondary | ICD-10-CM | POA: Diagnosis not present

## 2019-01-17 DIAGNOSIS — Z23 Encounter for immunization: Secondary | ICD-10-CM | POA: Diagnosis not present

## 2019-01-17 DIAGNOSIS — E1122 Type 2 diabetes mellitus with diabetic chronic kidney disease: Secondary | ICD-10-CM | POA: Diagnosis not present

## 2019-01-17 DIAGNOSIS — Z794 Long term (current) use of insulin: Secondary | ICD-10-CM | POA: Diagnosis not present

## 2019-01-19 DIAGNOSIS — E785 Hyperlipidemia, unspecified: Secondary | ICD-10-CM | POA: Diagnosis not present

## 2019-01-19 DIAGNOSIS — N184 Chronic kidney disease, stage 4 (severe): Secondary | ICD-10-CM | POA: Diagnosis not present

## 2019-01-19 DIAGNOSIS — E1022 Type 1 diabetes mellitus with diabetic chronic kidney disease: Secondary | ICD-10-CM | POA: Diagnosis not present

## 2019-01-19 DIAGNOSIS — N390 Urinary tract infection, site not specified: Secondary | ICD-10-CM | POA: Diagnosis not present

## 2019-01-19 DIAGNOSIS — Z8673 Personal history of transient ischemic attack (TIA), and cerebral infarction without residual deficits: Secondary | ICD-10-CM | POA: Diagnosis not present

## 2019-01-19 DIAGNOSIS — G43909 Migraine, unspecified, not intractable, without status migrainosus: Secondary | ICD-10-CM | POA: Diagnosis not present

## 2019-01-19 DIAGNOSIS — E1065 Type 1 diabetes mellitus with hyperglycemia: Secondary | ICD-10-CM | POA: Diagnosis not present

## 2019-01-19 DIAGNOSIS — B9689 Other specified bacterial agents as the cause of diseases classified elsewhere: Secondary | ICD-10-CM | POA: Diagnosis not present

## 2019-01-19 DIAGNOSIS — I129 Hypertensive chronic kidney disease with stage 1 through stage 4 chronic kidney disease, or unspecified chronic kidney disease: Secondary | ICD-10-CM | POA: Diagnosis not present

## 2019-01-19 DIAGNOSIS — Z87891 Personal history of nicotine dependence: Secondary | ICD-10-CM | POA: Diagnosis not present

## 2019-01-21 DIAGNOSIS — B9689 Other specified bacterial agents as the cause of diseases classified elsewhere: Secondary | ICD-10-CM | POA: Diagnosis not present

## 2019-01-21 DIAGNOSIS — Z8673 Personal history of transient ischemic attack (TIA), and cerebral infarction without residual deficits: Secondary | ICD-10-CM | POA: Diagnosis not present

## 2019-01-21 DIAGNOSIS — E1022 Type 1 diabetes mellitus with diabetic chronic kidney disease: Secondary | ICD-10-CM | POA: Diagnosis not present

## 2019-01-21 DIAGNOSIS — I129 Hypertensive chronic kidney disease with stage 1 through stage 4 chronic kidney disease, or unspecified chronic kidney disease: Secondary | ICD-10-CM | POA: Diagnosis not present

## 2019-01-21 DIAGNOSIS — N184 Chronic kidney disease, stage 4 (severe): Secondary | ICD-10-CM | POA: Diagnosis not present

## 2019-01-21 DIAGNOSIS — E785 Hyperlipidemia, unspecified: Secondary | ICD-10-CM | POA: Diagnosis not present

## 2019-01-21 DIAGNOSIS — N390 Urinary tract infection, site not specified: Secondary | ICD-10-CM | POA: Diagnosis not present

## 2019-01-21 DIAGNOSIS — E1065 Type 1 diabetes mellitus with hyperglycemia: Secondary | ICD-10-CM | POA: Diagnosis not present

## 2019-01-21 DIAGNOSIS — Z87891 Personal history of nicotine dependence: Secondary | ICD-10-CM | POA: Diagnosis not present

## 2019-01-21 DIAGNOSIS — G43909 Migraine, unspecified, not intractable, without status migrainosus: Secondary | ICD-10-CM | POA: Diagnosis not present

## 2019-01-23 DIAGNOSIS — Z8673 Personal history of transient ischemic attack (TIA), and cerebral infarction without residual deficits: Secondary | ICD-10-CM | POA: Diagnosis not present

## 2019-01-23 DIAGNOSIS — E1022 Type 1 diabetes mellitus with diabetic chronic kidney disease: Secondary | ICD-10-CM | POA: Diagnosis not present

## 2019-01-23 DIAGNOSIS — E785 Hyperlipidemia, unspecified: Secondary | ICD-10-CM | POA: Diagnosis not present

## 2019-01-23 DIAGNOSIS — G43909 Migraine, unspecified, not intractable, without status migrainosus: Secondary | ICD-10-CM | POA: Diagnosis not present

## 2019-01-23 DIAGNOSIS — E1065 Type 1 diabetes mellitus with hyperglycemia: Secondary | ICD-10-CM | POA: Diagnosis not present

## 2019-01-23 DIAGNOSIS — I129 Hypertensive chronic kidney disease with stage 1 through stage 4 chronic kidney disease, or unspecified chronic kidney disease: Secondary | ICD-10-CM | POA: Diagnosis not present

## 2019-01-23 DIAGNOSIS — N184 Chronic kidney disease, stage 4 (severe): Secondary | ICD-10-CM | POA: Diagnosis not present

## 2019-01-23 DIAGNOSIS — B9689 Other specified bacterial agents as the cause of diseases classified elsewhere: Secondary | ICD-10-CM | POA: Diagnosis not present

## 2019-01-23 DIAGNOSIS — Z87891 Personal history of nicotine dependence: Secondary | ICD-10-CM | POA: Diagnosis not present

## 2019-01-23 DIAGNOSIS — N390 Urinary tract infection, site not specified: Secondary | ICD-10-CM | POA: Diagnosis not present

## 2019-01-24 DIAGNOSIS — E1165 Type 2 diabetes mellitus with hyperglycemia: Secondary | ICD-10-CM | POA: Diagnosis not present

## 2019-01-24 DIAGNOSIS — E7800 Pure hypercholesterolemia, unspecified: Secondary | ICD-10-CM | POA: Diagnosis not present

## 2019-01-24 DIAGNOSIS — I10 Essential (primary) hypertension: Secondary | ICD-10-CM | POA: Diagnosis not present

## 2019-01-24 DIAGNOSIS — Z794 Long term (current) use of insulin: Secondary | ICD-10-CM | POA: Diagnosis not present

## 2019-01-24 DIAGNOSIS — E118 Type 2 diabetes mellitus with unspecified complications: Secondary | ICD-10-CM | POA: Diagnosis not present

## 2019-01-25 DIAGNOSIS — B9689 Other specified bacterial agents as the cause of diseases classified elsewhere: Secondary | ICD-10-CM | POA: Diagnosis not present

## 2019-01-25 DIAGNOSIS — N390 Urinary tract infection, site not specified: Secondary | ICD-10-CM | POA: Diagnosis not present

## 2019-01-25 DIAGNOSIS — E1065 Type 1 diabetes mellitus with hyperglycemia: Secondary | ICD-10-CM | POA: Diagnosis not present

## 2019-01-25 DIAGNOSIS — E785 Hyperlipidemia, unspecified: Secondary | ICD-10-CM | POA: Diagnosis not present

## 2019-01-25 DIAGNOSIS — Z8673 Personal history of transient ischemic attack (TIA), and cerebral infarction without residual deficits: Secondary | ICD-10-CM | POA: Diagnosis not present

## 2019-01-25 DIAGNOSIS — G43909 Migraine, unspecified, not intractable, without status migrainosus: Secondary | ICD-10-CM | POA: Diagnosis not present

## 2019-01-25 DIAGNOSIS — E1022 Type 1 diabetes mellitus with diabetic chronic kidney disease: Secondary | ICD-10-CM | POA: Diagnosis not present

## 2019-01-25 DIAGNOSIS — I129 Hypertensive chronic kidney disease with stage 1 through stage 4 chronic kidney disease, or unspecified chronic kidney disease: Secondary | ICD-10-CM | POA: Diagnosis not present

## 2019-01-25 DIAGNOSIS — N184 Chronic kidney disease, stage 4 (severe): Secondary | ICD-10-CM | POA: Diagnosis not present

## 2019-01-25 DIAGNOSIS — Z87891 Personal history of nicotine dependence: Secondary | ICD-10-CM | POA: Diagnosis not present

## 2019-01-26 DIAGNOSIS — N184 Chronic kidney disease, stage 4 (severe): Secondary | ICD-10-CM | POA: Diagnosis not present

## 2019-01-26 DIAGNOSIS — N390 Urinary tract infection, site not specified: Secondary | ICD-10-CM | POA: Diagnosis not present

## 2019-01-26 DIAGNOSIS — E1022 Type 1 diabetes mellitus with diabetic chronic kidney disease: Secondary | ICD-10-CM | POA: Diagnosis not present

## 2019-01-26 DIAGNOSIS — B9689 Other specified bacterial agents as the cause of diseases classified elsewhere: Secondary | ICD-10-CM | POA: Diagnosis not present

## 2019-01-26 DIAGNOSIS — Z8673 Personal history of transient ischemic attack (TIA), and cerebral infarction without residual deficits: Secondary | ICD-10-CM | POA: Diagnosis not present

## 2019-01-26 DIAGNOSIS — G43909 Migraine, unspecified, not intractable, without status migrainosus: Secondary | ICD-10-CM | POA: Diagnosis not present

## 2019-01-26 DIAGNOSIS — E785 Hyperlipidemia, unspecified: Secondary | ICD-10-CM | POA: Diagnosis not present

## 2019-01-26 DIAGNOSIS — I129 Hypertensive chronic kidney disease with stage 1 through stage 4 chronic kidney disease, or unspecified chronic kidney disease: Secondary | ICD-10-CM | POA: Diagnosis not present

## 2019-01-26 DIAGNOSIS — E1065 Type 1 diabetes mellitus with hyperglycemia: Secondary | ICD-10-CM | POA: Diagnosis not present

## 2019-01-26 DIAGNOSIS — Z87891 Personal history of nicotine dependence: Secondary | ICD-10-CM | POA: Diagnosis not present

## 2019-01-29 DIAGNOSIS — N184 Chronic kidney disease, stage 4 (severe): Secondary | ICD-10-CM | POA: Diagnosis not present

## 2019-01-29 DIAGNOSIS — E1022 Type 1 diabetes mellitus with diabetic chronic kidney disease: Secondary | ICD-10-CM | POA: Diagnosis not present

## 2019-01-29 DIAGNOSIS — G43909 Migraine, unspecified, not intractable, without status migrainosus: Secondary | ICD-10-CM | POA: Diagnosis not present

## 2019-01-29 DIAGNOSIS — E1065 Type 1 diabetes mellitus with hyperglycemia: Secondary | ICD-10-CM | POA: Diagnosis not present

## 2019-01-29 DIAGNOSIS — N390 Urinary tract infection, site not specified: Secondary | ICD-10-CM | POA: Diagnosis not present

## 2019-01-29 DIAGNOSIS — E785 Hyperlipidemia, unspecified: Secondary | ICD-10-CM | POA: Diagnosis not present

## 2019-01-29 DIAGNOSIS — Z8673 Personal history of transient ischemic attack (TIA), and cerebral infarction without residual deficits: Secondary | ICD-10-CM | POA: Diagnosis not present

## 2019-01-29 DIAGNOSIS — B9689 Other specified bacterial agents as the cause of diseases classified elsewhere: Secondary | ICD-10-CM | POA: Diagnosis not present

## 2019-01-29 DIAGNOSIS — I129 Hypertensive chronic kidney disease with stage 1 through stage 4 chronic kidney disease, or unspecified chronic kidney disease: Secondary | ICD-10-CM | POA: Diagnosis not present

## 2019-01-29 DIAGNOSIS — Z87891 Personal history of nicotine dependence: Secondary | ICD-10-CM | POA: Diagnosis not present

## 2019-01-31 DIAGNOSIS — Z8673 Personal history of transient ischemic attack (TIA), and cerebral infarction without residual deficits: Secondary | ICD-10-CM | POA: Diagnosis not present

## 2019-01-31 DIAGNOSIS — E1022 Type 1 diabetes mellitus with diabetic chronic kidney disease: Secondary | ICD-10-CM | POA: Diagnosis not present

## 2019-01-31 DIAGNOSIS — E1065 Type 1 diabetes mellitus with hyperglycemia: Secondary | ICD-10-CM | POA: Diagnosis not present

## 2019-01-31 DIAGNOSIS — G43909 Migraine, unspecified, not intractable, without status migrainosus: Secondary | ICD-10-CM | POA: Diagnosis not present

## 2019-01-31 DIAGNOSIS — Z87891 Personal history of nicotine dependence: Secondary | ICD-10-CM | POA: Diagnosis not present

## 2019-01-31 DIAGNOSIS — I129 Hypertensive chronic kidney disease with stage 1 through stage 4 chronic kidney disease, or unspecified chronic kidney disease: Secondary | ICD-10-CM | POA: Diagnosis not present

## 2019-01-31 DIAGNOSIS — N184 Chronic kidney disease, stage 4 (severe): Secondary | ICD-10-CM | POA: Diagnosis not present

## 2019-01-31 DIAGNOSIS — E785 Hyperlipidemia, unspecified: Secondary | ICD-10-CM | POA: Diagnosis not present

## 2019-01-31 DIAGNOSIS — N390 Urinary tract infection, site not specified: Secondary | ICD-10-CM | POA: Diagnosis not present

## 2019-01-31 DIAGNOSIS — B9689 Other specified bacterial agents as the cause of diseases classified elsewhere: Secondary | ICD-10-CM | POA: Diagnosis not present

## 2019-02-05 DIAGNOSIS — Z87891 Personal history of nicotine dependence: Secondary | ICD-10-CM | POA: Diagnosis not present

## 2019-02-05 DIAGNOSIS — E785 Hyperlipidemia, unspecified: Secondary | ICD-10-CM | POA: Diagnosis not present

## 2019-02-05 DIAGNOSIS — E1022 Type 1 diabetes mellitus with diabetic chronic kidney disease: Secondary | ICD-10-CM | POA: Diagnosis not present

## 2019-02-05 DIAGNOSIS — I129 Hypertensive chronic kidney disease with stage 1 through stage 4 chronic kidney disease, or unspecified chronic kidney disease: Secondary | ICD-10-CM | POA: Diagnosis not present

## 2019-02-05 DIAGNOSIS — N390 Urinary tract infection, site not specified: Secondary | ICD-10-CM | POA: Diagnosis not present

## 2019-02-05 DIAGNOSIS — G43909 Migraine, unspecified, not intractable, without status migrainosus: Secondary | ICD-10-CM | POA: Diagnosis not present

## 2019-02-05 DIAGNOSIS — E1065 Type 1 diabetes mellitus with hyperglycemia: Secondary | ICD-10-CM | POA: Diagnosis not present

## 2019-02-05 DIAGNOSIS — N184 Chronic kidney disease, stage 4 (severe): Secondary | ICD-10-CM | POA: Diagnosis not present

## 2019-02-05 DIAGNOSIS — B9689 Other specified bacterial agents as the cause of diseases classified elsewhere: Secondary | ICD-10-CM | POA: Diagnosis not present

## 2019-02-05 DIAGNOSIS — Z8673 Personal history of transient ischemic attack (TIA), and cerebral infarction without residual deficits: Secondary | ICD-10-CM | POA: Diagnosis not present

## 2019-02-06 DIAGNOSIS — B9689 Other specified bacterial agents as the cause of diseases classified elsewhere: Secondary | ICD-10-CM | POA: Diagnosis not present

## 2019-02-06 DIAGNOSIS — N390 Urinary tract infection, site not specified: Secondary | ICD-10-CM | POA: Diagnosis not present

## 2019-02-06 DIAGNOSIS — N184 Chronic kidney disease, stage 4 (severe): Secondary | ICD-10-CM | POA: Diagnosis not present

## 2019-02-06 DIAGNOSIS — I129 Hypertensive chronic kidney disease with stage 1 through stage 4 chronic kidney disease, or unspecified chronic kidney disease: Secondary | ICD-10-CM | POA: Diagnosis not present

## 2019-02-06 DIAGNOSIS — Z87891 Personal history of nicotine dependence: Secondary | ICD-10-CM | POA: Diagnosis not present

## 2019-02-06 DIAGNOSIS — E785 Hyperlipidemia, unspecified: Secondary | ICD-10-CM | POA: Diagnosis not present

## 2019-02-06 DIAGNOSIS — G43909 Migraine, unspecified, not intractable, without status migrainosus: Secondary | ICD-10-CM | POA: Diagnosis not present

## 2019-02-06 DIAGNOSIS — E1022 Type 1 diabetes mellitus with diabetic chronic kidney disease: Secondary | ICD-10-CM | POA: Diagnosis not present

## 2019-02-06 DIAGNOSIS — Z8673 Personal history of transient ischemic attack (TIA), and cerebral infarction without residual deficits: Secondary | ICD-10-CM | POA: Diagnosis not present

## 2019-02-06 DIAGNOSIS — E1065 Type 1 diabetes mellitus with hyperglycemia: Secondary | ICD-10-CM | POA: Diagnosis not present

## 2019-02-07 DIAGNOSIS — N184 Chronic kidney disease, stage 4 (severe): Secondary | ICD-10-CM | POA: Diagnosis not present

## 2019-02-07 DIAGNOSIS — E1065 Type 1 diabetes mellitus with hyperglycemia: Secondary | ICD-10-CM | POA: Diagnosis not present

## 2019-02-07 DIAGNOSIS — G43909 Migraine, unspecified, not intractable, without status migrainosus: Secondary | ICD-10-CM | POA: Diagnosis not present

## 2019-02-07 DIAGNOSIS — N390 Urinary tract infection, site not specified: Secondary | ICD-10-CM | POA: Diagnosis not present

## 2019-02-07 DIAGNOSIS — E785 Hyperlipidemia, unspecified: Secondary | ICD-10-CM | POA: Diagnosis not present

## 2019-02-07 DIAGNOSIS — B9689 Other specified bacterial agents as the cause of diseases classified elsewhere: Secondary | ICD-10-CM | POA: Diagnosis not present

## 2019-02-07 DIAGNOSIS — E1022 Type 1 diabetes mellitus with diabetic chronic kidney disease: Secondary | ICD-10-CM | POA: Diagnosis not present

## 2019-02-07 DIAGNOSIS — Z87891 Personal history of nicotine dependence: Secondary | ICD-10-CM | POA: Diagnosis not present

## 2019-02-07 DIAGNOSIS — Z8673 Personal history of transient ischemic attack (TIA), and cerebral infarction without residual deficits: Secondary | ICD-10-CM | POA: Diagnosis not present

## 2019-02-07 DIAGNOSIS — I129 Hypertensive chronic kidney disease with stage 1 through stage 4 chronic kidney disease, or unspecified chronic kidney disease: Secondary | ICD-10-CM | POA: Diagnosis not present

## 2019-02-14 DIAGNOSIS — N390 Urinary tract infection, site not specified: Secondary | ICD-10-CM | POA: Diagnosis not present

## 2019-02-14 DIAGNOSIS — E1065 Type 1 diabetes mellitus with hyperglycemia: Secondary | ICD-10-CM | POA: Diagnosis not present

## 2019-02-14 DIAGNOSIS — E7800 Pure hypercholesterolemia, unspecified: Secondary | ICD-10-CM | POA: Diagnosis not present

## 2019-02-14 DIAGNOSIS — N184 Chronic kidney disease, stage 4 (severe): Secondary | ICD-10-CM | POA: Diagnosis not present

## 2019-02-14 DIAGNOSIS — E118 Type 2 diabetes mellitus with unspecified complications: Secondary | ICD-10-CM | POA: Diagnosis not present

## 2019-02-14 DIAGNOSIS — Z8673 Personal history of transient ischemic attack (TIA), and cerebral infarction without residual deficits: Secondary | ICD-10-CM | POA: Diagnosis not present

## 2019-02-14 DIAGNOSIS — G43909 Migraine, unspecified, not intractable, without status migrainosus: Secondary | ICD-10-CM | POA: Diagnosis not present

## 2019-02-14 DIAGNOSIS — E785 Hyperlipidemia, unspecified: Secondary | ICD-10-CM | POA: Diagnosis not present

## 2019-02-14 DIAGNOSIS — E1165 Type 2 diabetes mellitus with hyperglycemia: Secondary | ICD-10-CM | POA: Diagnosis not present

## 2019-02-14 DIAGNOSIS — B9689 Other specified bacterial agents as the cause of diseases classified elsewhere: Secondary | ICD-10-CM | POA: Diagnosis not present

## 2019-02-14 DIAGNOSIS — Z794 Long term (current) use of insulin: Secondary | ICD-10-CM | POA: Diagnosis not present

## 2019-02-14 DIAGNOSIS — E1022 Type 1 diabetes mellitus with diabetic chronic kidney disease: Secondary | ICD-10-CM | POA: Diagnosis not present

## 2019-02-14 DIAGNOSIS — I10 Essential (primary) hypertension: Secondary | ICD-10-CM | POA: Diagnosis not present

## 2019-02-14 DIAGNOSIS — I129 Hypertensive chronic kidney disease with stage 1 through stage 4 chronic kidney disease, or unspecified chronic kidney disease: Secondary | ICD-10-CM | POA: Diagnosis not present

## 2019-02-14 DIAGNOSIS — Z87891 Personal history of nicotine dependence: Secondary | ICD-10-CM | POA: Diagnosis not present

## 2019-02-15 DIAGNOSIS — E785 Hyperlipidemia, unspecified: Secondary | ICD-10-CM | POA: Diagnosis not present

## 2019-02-15 DIAGNOSIS — B9689 Other specified bacterial agents as the cause of diseases classified elsewhere: Secondary | ICD-10-CM | POA: Diagnosis not present

## 2019-02-15 DIAGNOSIS — G43909 Migraine, unspecified, not intractable, without status migrainosus: Secondary | ICD-10-CM | POA: Diagnosis not present

## 2019-02-15 DIAGNOSIS — E1065 Type 1 diabetes mellitus with hyperglycemia: Secondary | ICD-10-CM | POA: Diagnosis not present

## 2019-02-15 DIAGNOSIS — Z87891 Personal history of nicotine dependence: Secondary | ICD-10-CM | POA: Diagnosis not present

## 2019-02-15 DIAGNOSIS — N184 Chronic kidney disease, stage 4 (severe): Secondary | ICD-10-CM | POA: Diagnosis not present

## 2019-02-15 DIAGNOSIS — Z8673 Personal history of transient ischemic attack (TIA), and cerebral infarction without residual deficits: Secondary | ICD-10-CM | POA: Diagnosis not present

## 2019-02-15 DIAGNOSIS — I129 Hypertensive chronic kidney disease with stage 1 through stage 4 chronic kidney disease, or unspecified chronic kidney disease: Secondary | ICD-10-CM | POA: Diagnosis not present

## 2019-02-15 DIAGNOSIS — E1022 Type 1 diabetes mellitus with diabetic chronic kidney disease: Secondary | ICD-10-CM | POA: Diagnosis not present

## 2019-02-15 DIAGNOSIS — N390 Urinary tract infection, site not specified: Secondary | ICD-10-CM | POA: Diagnosis not present

## 2019-02-26 DIAGNOSIS — E1065 Type 1 diabetes mellitus with hyperglycemia: Secondary | ICD-10-CM | POA: Diagnosis not present

## 2019-02-26 DIAGNOSIS — E1022 Type 1 diabetes mellitus with diabetic chronic kidney disease: Secondary | ICD-10-CM | POA: Diagnosis not present

## 2019-02-26 DIAGNOSIS — I129 Hypertensive chronic kidney disease with stage 1 through stage 4 chronic kidney disease, or unspecified chronic kidney disease: Secondary | ICD-10-CM | POA: Diagnosis not present

## 2019-02-26 DIAGNOSIS — N184 Chronic kidney disease, stage 4 (severe): Secondary | ICD-10-CM | POA: Diagnosis not present

## 2019-04-17 DIAGNOSIS — E1165 Type 2 diabetes mellitus with hyperglycemia: Secondary | ICD-10-CM | POA: Diagnosis not present

## 2019-04-17 DIAGNOSIS — E7800 Pure hypercholesterolemia, unspecified: Secondary | ICD-10-CM | POA: Diagnosis not present

## 2019-04-17 DIAGNOSIS — E118 Type 2 diabetes mellitus with unspecified complications: Secondary | ICD-10-CM | POA: Diagnosis not present

## 2019-04-17 DIAGNOSIS — Z794 Long term (current) use of insulin: Secondary | ICD-10-CM | POA: Diagnosis not present

## 2019-04-17 DIAGNOSIS — I10 Essential (primary) hypertension: Secondary | ICD-10-CM | POA: Diagnosis not present

## 2019-04-18 DIAGNOSIS — E7800 Pure hypercholesterolemia, unspecified: Secondary | ICD-10-CM | POA: Diagnosis not present

## 2019-04-18 DIAGNOSIS — E1122 Type 2 diabetes mellitus with diabetic chronic kidney disease: Secondary | ICD-10-CM | POA: Diagnosis not present

## 2019-04-18 DIAGNOSIS — I10 Essential (primary) hypertension: Secondary | ICD-10-CM | POA: Diagnosis not present

## 2019-04-18 DIAGNOSIS — E1165 Type 2 diabetes mellitus with hyperglycemia: Secondary | ICD-10-CM | POA: Diagnosis not present

## 2019-04-18 DIAGNOSIS — E559 Vitamin D deficiency, unspecified: Secondary | ICD-10-CM | POA: Diagnosis not present

## 2019-04-25 DIAGNOSIS — N184 Chronic kidney disease, stage 4 (severe): Secondary | ICD-10-CM | POA: Diagnosis not present

## 2019-04-25 DIAGNOSIS — I10 Essential (primary) hypertension: Secondary | ICD-10-CM | POA: Diagnosis not present

## 2019-04-25 DIAGNOSIS — E7800 Pure hypercholesterolemia, unspecified: Secondary | ICD-10-CM | POA: Diagnosis not present

## 2019-04-25 DIAGNOSIS — N179 Acute kidney failure, unspecified: Secondary | ICD-10-CM | POA: Diagnosis not present

## 2019-04-25 DIAGNOSIS — E1122 Type 2 diabetes mellitus with diabetic chronic kidney disease: Secondary | ICD-10-CM | POA: Diagnosis not present

## 2019-04-25 DIAGNOSIS — G43909 Migraine, unspecified, not intractable, without status migrainosus: Secondary | ICD-10-CM | POA: Diagnosis not present

## 2019-04-25 DIAGNOSIS — Z23 Encounter for immunization: Secondary | ICD-10-CM | POA: Diagnosis not present

## 2019-05-24 DIAGNOSIS — E118 Type 2 diabetes mellitus with unspecified complications: Secondary | ICD-10-CM | POA: Diagnosis not present

## 2019-05-24 DIAGNOSIS — E7800 Pure hypercholesterolemia, unspecified: Secondary | ICD-10-CM | POA: Diagnosis not present

## 2019-05-24 DIAGNOSIS — E1122 Type 2 diabetes mellitus with diabetic chronic kidney disease: Secondary | ICD-10-CM | POA: Diagnosis not present

## 2019-05-24 DIAGNOSIS — N184 Chronic kidney disease, stage 4 (severe): Secondary | ICD-10-CM | POA: Diagnosis not present

## 2019-05-24 DIAGNOSIS — I10 Essential (primary) hypertension: Secondary | ICD-10-CM | POA: Diagnosis not present

## 2019-07-19 DIAGNOSIS — Z780 Asymptomatic menopausal state: Secondary | ICD-10-CM | POA: Diagnosis not present

## 2019-07-19 DIAGNOSIS — E118 Type 2 diabetes mellitus with unspecified complications: Secondary | ICD-10-CM | POA: Diagnosis not present

## 2019-07-19 DIAGNOSIS — I10 Essential (primary) hypertension: Secondary | ICD-10-CM | POA: Diagnosis not present

## 2019-07-24 DIAGNOSIS — I10 Essential (primary) hypertension: Secondary | ICD-10-CM | POA: Diagnosis not present

## 2019-07-24 DIAGNOSIS — G43909 Migraine, unspecified, not intractable, without status migrainosus: Secondary | ICD-10-CM | POA: Diagnosis not present

## 2019-07-24 DIAGNOSIS — N184 Chronic kidney disease, stage 4 (severe): Secondary | ICD-10-CM | POA: Diagnosis not present

## 2019-07-24 DIAGNOSIS — E118 Type 2 diabetes mellitus with unspecified complications: Secondary | ICD-10-CM | POA: Diagnosis not present

## 2019-07-24 DIAGNOSIS — E7800 Pure hypercholesterolemia, unspecified: Secondary | ICD-10-CM | POA: Diagnosis not present

## 2019-10-24 DIAGNOSIS — I10 Essential (primary) hypertension: Secondary | ICD-10-CM | POA: Diagnosis not present

## 2019-10-24 DIAGNOSIS — E118 Type 2 diabetes mellitus with unspecified complications: Secondary | ICD-10-CM | POA: Diagnosis not present

## 2019-11-01 DIAGNOSIS — G43909 Migraine, unspecified, not intractable, without status migrainosus: Secondary | ICD-10-CM | POA: Diagnosis not present

## 2019-11-01 DIAGNOSIS — Z794 Long term (current) use of insulin: Secondary | ICD-10-CM | POA: Diagnosis not present

## 2019-11-01 DIAGNOSIS — E1165 Type 2 diabetes mellitus with hyperglycemia: Secondary | ICD-10-CM | POA: Diagnosis not present

## 2020-03-05 DIAGNOSIS — I10 Essential (primary) hypertension: Secondary | ICD-10-CM | POA: Diagnosis not present

## 2020-03-05 DIAGNOSIS — E7800 Pure hypercholesterolemia, unspecified: Secondary | ICD-10-CM | POA: Diagnosis not present

## 2020-03-05 DIAGNOSIS — E1165 Type 2 diabetes mellitus with hyperglycemia: Secondary | ICD-10-CM | POA: Diagnosis not present

## 2020-03-05 DIAGNOSIS — E118 Type 2 diabetes mellitus with unspecified complications: Secondary | ICD-10-CM | POA: Diagnosis not present

## 2020-03-05 DIAGNOSIS — Z794 Long term (current) use of insulin: Secondary | ICD-10-CM | POA: Diagnosis not present

## 2020-06-04 DIAGNOSIS — E7800 Pure hypercholesterolemia, unspecified: Secondary | ICD-10-CM | POA: Diagnosis not present

## 2020-06-04 DIAGNOSIS — E1165 Type 2 diabetes mellitus with hyperglycemia: Secondary | ICD-10-CM | POA: Diagnosis not present

## 2020-06-04 DIAGNOSIS — I10 Essential (primary) hypertension: Secondary | ICD-10-CM | POA: Diagnosis not present

## 2020-06-04 DIAGNOSIS — Z794 Long term (current) use of insulin: Secondary | ICD-10-CM | POA: Diagnosis not present

## 2020-06-10 DIAGNOSIS — I10 Essential (primary) hypertension: Secondary | ICD-10-CM | POA: Diagnosis not present

## 2020-06-10 DIAGNOSIS — E118 Type 2 diabetes mellitus with unspecified complications: Secondary | ICD-10-CM | POA: Diagnosis not present

## 2020-06-24 DIAGNOSIS — L989 Disorder of the skin and subcutaneous tissue, unspecified: Secondary | ICD-10-CM | POA: Diagnosis not present

## 2020-06-24 DIAGNOSIS — Z23 Encounter for immunization: Secondary | ICD-10-CM | POA: Diagnosis not present

## 2020-10-06 DIAGNOSIS — I10 Essential (primary) hypertension: Secondary | ICD-10-CM | POA: Diagnosis not present

## 2020-10-06 DIAGNOSIS — E7800 Pure hypercholesterolemia, unspecified: Secondary | ICD-10-CM | POA: Diagnosis not present

## 2020-10-06 DIAGNOSIS — Z794 Long term (current) use of insulin: Secondary | ICD-10-CM | POA: Diagnosis not present

## 2020-10-06 DIAGNOSIS — E1165 Type 2 diabetes mellitus with hyperglycemia: Secondary | ICD-10-CM | POA: Diagnosis not present

## 2021-03-30 DIAGNOSIS — E1165 Type 2 diabetes mellitus with hyperglycemia: Secondary | ICD-10-CM | POA: Diagnosis not present

## 2021-04-01 DIAGNOSIS — E1165 Type 2 diabetes mellitus with hyperglycemia: Secondary | ICD-10-CM | POA: Diagnosis not present

## 2021-04-06 DIAGNOSIS — Z794 Long term (current) use of insulin: Secondary | ICD-10-CM | POA: Diagnosis not present

## 2021-04-06 DIAGNOSIS — N184 Chronic kidney disease, stage 4 (severe): Secondary | ICD-10-CM | POA: Diagnosis not present

## 2021-04-06 DIAGNOSIS — I10 Essential (primary) hypertension: Secondary | ICD-10-CM | POA: Diagnosis not present

## 2021-04-06 DIAGNOSIS — E7800 Pure hypercholesterolemia, unspecified: Secondary | ICD-10-CM | POA: Diagnosis not present

## 2021-04-06 DIAGNOSIS — E1165 Type 2 diabetes mellitus with hyperglycemia: Secondary | ICD-10-CM | POA: Diagnosis not present

## 2021-06-03 DIAGNOSIS — M25562 Pain in left knee: Secondary | ICD-10-CM | POA: Diagnosis not present

## 2021-06-03 DIAGNOSIS — M25552 Pain in left hip: Secondary | ICD-10-CM | POA: Diagnosis not present

## 2021-06-03 DIAGNOSIS — M25472 Effusion, left ankle: Secondary | ICD-10-CM | POA: Diagnosis not present

## 2022-02-02 DIAGNOSIS — F03918 Unspecified dementia, unspecified severity, with other behavioral disturbance: Secondary | ICD-10-CM | POA: Diagnosis not present

## 2022-02-02 DIAGNOSIS — Z7401 Bed confinement status: Secondary | ICD-10-CM | POA: Diagnosis not present

## 2022-02-09 ENCOUNTER — Encounter (HOSPITAL_COMMUNITY): Payer: Self-pay | Admitting: *Deleted

## 2022-02-09 ENCOUNTER — Emergency Department (HOSPITAL_COMMUNITY): Payer: Medicare Other

## 2022-02-09 ENCOUNTER — Inpatient Hospital Stay (HOSPITAL_COMMUNITY)
Admission: EM | Admit: 2022-02-09 | Discharge: 2022-02-13 | DRG: 871 | Disposition: A | Discharge status: Hospice | Payer: Medicare Other | Attending: Family Medicine | Admitting: Family Medicine

## 2022-02-09 ENCOUNTER — Other Ambulatory Visit: Payer: Self-pay

## 2022-02-09 DIAGNOSIS — R4189 Other symptoms and signs involving cognitive functions and awareness: Secondary | ICD-10-CM | POA: Diagnosis present

## 2022-02-09 DIAGNOSIS — R6889 Other general symptoms and signs: Secondary | ICD-10-CM | POA: Diagnosis not present

## 2022-02-09 DIAGNOSIS — E785 Hyperlipidemia, unspecified: Secondary | ICD-10-CM | POA: Diagnosis present

## 2022-02-09 DIAGNOSIS — G9340 Encephalopathy, unspecified: Secondary | ICD-10-CM | POA: Diagnosis present

## 2022-02-09 DIAGNOSIS — J189 Pneumonia, unspecified organism: Secondary | ICD-10-CM | POA: Diagnosis present

## 2022-02-09 DIAGNOSIS — Z515 Encounter for palliative care: Secondary | ICD-10-CM

## 2022-02-09 DIAGNOSIS — K1121 Acute sialoadenitis: Secondary | ICD-10-CM | POA: Diagnosis present

## 2022-02-09 DIAGNOSIS — Z66 Do not resuscitate: Secondary | ICD-10-CM | POA: Diagnosis not present

## 2022-02-09 DIAGNOSIS — G9341 Metabolic encephalopathy: Secondary | ICD-10-CM | POA: Diagnosis present

## 2022-02-09 DIAGNOSIS — E872 Acidosis, unspecified: Secondary | ICD-10-CM | POA: Diagnosis present

## 2022-02-09 DIAGNOSIS — R4781 Slurred speech: Secondary | ICD-10-CM | POA: Diagnosis present

## 2022-02-09 DIAGNOSIS — Z87891 Personal history of nicotine dependence: Secondary | ICD-10-CM

## 2022-02-09 DIAGNOSIS — F03918 Unspecified dementia, unspecified severity, with other behavioral disturbance: Secondary | ICD-10-CM | POA: Diagnosis not present

## 2022-02-09 DIAGNOSIS — G43909 Migraine, unspecified, not intractable, without status migrainosus: Secondary | ICD-10-CM | POA: Diagnosis present

## 2022-02-09 DIAGNOSIS — B958 Unspecified staphylococcus as the cause of diseases classified elsewhere: Secondary | ICD-10-CM | POA: Diagnosis present

## 2022-02-09 DIAGNOSIS — R6521 Severe sepsis with septic shock: Secondary | ICD-10-CM | POA: Diagnosis present

## 2022-02-09 DIAGNOSIS — L89150 Pressure ulcer of sacral region, unstageable: Secondary | ICD-10-CM | POA: Diagnosis not present

## 2022-02-09 DIAGNOSIS — R410 Disorientation, unspecified: Secondary | ICD-10-CM | POA: Diagnosis not present

## 2022-02-09 DIAGNOSIS — R630 Anorexia: Secondary | ICD-10-CM | POA: Diagnosis present

## 2022-02-09 DIAGNOSIS — E1065 Type 1 diabetes mellitus with hyperglycemia: Secondary | ICD-10-CM | POA: Diagnosis present

## 2022-02-09 DIAGNOSIS — Z794 Long term (current) use of insulin: Secondary | ICD-10-CM

## 2022-02-09 DIAGNOSIS — I10 Essential (primary) hypertension: Secondary | ICD-10-CM | POA: Diagnosis not present

## 2022-02-09 DIAGNOSIS — R590 Localized enlarged lymph nodes: Secondary | ICD-10-CM | POA: Diagnosis not present

## 2022-02-09 DIAGNOSIS — Z8249 Family history of ischemic heart disease and other diseases of the circulatory system: Secondary | ICD-10-CM

## 2022-02-09 DIAGNOSIS — E1100 Type 2 diabetes mellitus with hyperosmolarity without nonketotic hyperglycemic-hyperosmolar coma (NKHHC): Secondary | ICD-10-CM | POA: Diagnosis not present

## 2022-02-09 DIAGNOSIS — R627 Adult failure to thrive: Secondary | ICD-10-CM | POA: Diagnosis present

## 2022-02-09 DIAGNOSIS — L89303 Pressure ulcer of unspecified buttock, stage 3: Secondary | ICD-10-CM | POA: Diagnosis not present

## 2022-02-09 DIAGNOSIS — A419 Sepsis, unspecified organism: Secondary | ICD-10-CM | POA: Diagnosis not present

## 2022-02-09 DIAGNOSIS — Z7189 Other specified counseling: Secondary | ICD-10-CM | POA: Diagnosis not present

## 2022-02-09 DIAGNOSIS — T699XXA Effect of reduced temperature, unspecified, initial encounter: Secondary | ICD-10-CM | POA: Diagnosis not present

## 2022-02-09 DIAGNOSIS — I129 Hypertensive chronic kidney disease with stage 1 through stage 4 chronic kidney disease, or unspecified chronic kidney disease: Secondary | ICD-10-CM | POA: Diagnosis not present

## 2022-02-09 DIAGNOSIS — E1165 Type 2 diabetes mellitus with hyperglycemia: Secondary | ICD-10-CM | POA: Diagnosis not present

## 2022-02-09 DIAGNOSIS — L89100 Pressure ulcer of unspecified part of back, unstageable: Secondary | ICD-10-CM | POA: Diagnosis not present

## 2022-02-09 DIAGNOSIS — E161 Other hypoglycemia: Secondary | ICD-10-CM | POA: Diagnosis not present

## 2022-02-09 DIAGNOSIS — I6782 Cerebral ischemia: Secondary | ICD-10-CM | POA: Diagnosis not present

## 2022-02-09 DIAGNOSIS — N179 Acute kidney failure, unspecified: Secondary | ICD-10-CM | POA: Diagnosis present

## 2022-02-09 DIAGNOSIS — N184 Chronic kidney disease, stage 4 (severe): Secondary | ICD-10-CM | POA: Diagnosis not present

## 2022-02-09 DIAGNOSIS — E870 Hyperosmolality and hypernatremia: Secondary | ICD-10-CM | POA: Diagnosis not present

## 2022-02-09 DIAGNOSIS — R404 Transient alteration of awareness: Secondary | ICD-10-CM | POA: Diagnosis not present

## 2022-02-09 DIAGNOSIS — N390 Urinary tract infection, site not specified: Secondary | ICD-10-CM | POA: Diagnosis not present

## 2022-02-09 DIAGNOSIS — E1022 Type 1 diabetes mellitus with diabetic chronic kidney disease: Secondary | ICD-10-CM | POA: Diagnosis present

## 2022-02-09 DIAGNOSIS — K111 Hypertrophy of salivary gland: Secondary | ICD-10-CM | POA: Diagnosis not present

## 2022-02-09 DIAGNOSIS — D72829 Elevated white blood cell count, unspecified: Secondary | ICD-10-CM | POA: Diagnosis not present

## 2022-02-09 DIAGNOSIS — Z8673 Personal history of transient ischemic attack (TIA), and cerebral infarction without residual deficits: Secondary | ICD-10-CM | POA: Diagnosis not present

## 2022-02-09 DIAGNOSIS — N2889 Other specified disorders of kidney and ureter: Secondary | ICD-10-CM | POA: Diagnosis not present

## 2022-02-09 DIAGNOSIS — Z833 Family history of diabetes mellitus: Secondary | ICD-10-CM

## 2022-02-09 DIAGNOSIS — E860 Dehydration: Secondary | ICD-10-CM | POA: Diagnosis present

## 2022-02-09 DIAGNOSIS — Z681 Body mass index (BMI) 19 or less, adult: Secondary | ICD-10-CM | POA: Diagnosis not present

## 2022-02-09 DIAGNOSIS — Z90710 Acquired absence of both cervix and uterus: Secondary | ICD-10-CM

## 2022-02-09 DIAGNOSIS — E279 Disorder of adrenal gland, unspecified: Secondary | ICD-10-CM | POA: Diagnosis not present

## 2022-02-09 DIAGNOSIS — R739 Hyperglycemia, unspecified: Secondary | ICD-10-CM | POA: Diagnosis not present

## 2022-02-09 DIAGNOSIS — Z7401 Bed confinement status: Secondary | ICD-10-CM

## 2022-02-09 DIAGNOSIS — L8990 Pressure ulcer of unspecified site, unspecified stage: Secondary | ICD-10-CM

## 2022-02-09 DIAGNOSIS — Z743 Need for continuous supervision: Secondary | ICD-10-CM | POA: Diagnosis not present

## 2022-02-09 DIAGNOSIS — G319 Degenerative disease of nervous system, unspecified: Secondary | ICD-10-CM | POA: Diagnosis not present

## 2022-02-09 LAB — CBC WITH DIFFERENTIAL/PLATELET
Abs Immature Granulocytes: 0.22 10*3/uL — ABNORMAL HIGH (ref 0.00–0.07)
Basophils Absolute: 0 10*3/uL (ref 0.0–0.1)
Basophils Relative: 0 %
Eosinophils Absolute: 0 10*3/uL (ref 0.0–0.5)
Eosinophils Relative: 0 %
HCT: 39.7 % (ref 36.0–46.0)
Hemoglobin: 12.6 g/dL (ref 12.0–15.0)
Immature Granulocytes: 1 %
Lymphocytes Relative: 7 %
Lymphs Abs: 1.5 10*3/uL (ref 0.7–4.0)
MCH: 30.4 pg (ref 26.0–34.0)
MCHC: 31.7 g/dL (ref 30.0–36.0)
MCV: 95.9 fL (ref 80.0–100.0)
Monocytes Absolute: 1.4 10*3/uL — ABNORMAL HIGH (ref 0.1–1.0)
Monocytes Relative: 6 %
Neutro Abs: 19.3 10*3/uL — ABNORMAL HIGH (ref 1.7–7.7)
Neutrophils Relative %: 86 %
Platelets: 274 10*3/uL (ref 150–400)
RBC: 4.14 MIL/uL (ref 3.87–5.11)
RDW: 13.5 % (ref 11.5–15.5)
WBC: 22.4 10*3/uL — ABNORMAL HIGH (ref 4.0–10.5)
nRBC: 0 % (ref 0.0–0.2)

## 2022-02-09 LAB — I-STAT VENOUS BLOOD GAS, ED
Acid-Base Excess: 0 mmol/L (ref 0.0–2.0)
Bicarbonate: 24.7 mmol/L (ref 20.0–28.0)
Calcium, Ion: 1.2 mmol/L (ref 1.15–1.40)
HCT: 37 % (ref 36.0–46.0)
Hemoglobin: 12.6 g/dL (ref 12.0–15.0)
O2 Saturation: 94 %
Potassium: 4.7 mmol/L (ref 3.5–5.1)
Sodium: 146 mmol/L — ABNORMAL HIGH (ref 135–145)
TCO2: 26 mmol/L (ref 22–32)
pCO2, Ven: 39.7 mmHg — ABNORMAL LOW (ref 44–60)
pH, Ven: 7.403 (ref 7.25–7.43)
pO2, Ven: 70 mmHg — ABNORMAL HIGH (ref 32–45)

## 2022-02-09 LAB — OSMOLALITY: Osmolality: 361 mOsm/kg (ref 275–295)

## 2022-02-09 LAB — CBG MONITORING, ED: Glucose-Capillary: 567 mg/dL (ref 70–99)

## 2022-02-09 MED ORDER — INSULIN ASPART 100 UNIT/ML IJ SOLN
8.0000 [IU] | Freq: Once | INTRAMUSCULAR | Status: AC
  Administered 2022-02-09: 8 [IU] via INTRAVENOUS

## 2022-02-09 MED ORDER — LACTATED RINGERS IV BOLUS
1000.0000 mL | Freq: Once | INTRAVENOUS | Status: AC
  Administered 2022-02-09: 1000 mL via INTRAVENOUS

## 2022-02-09 MED ORDER — SODIUM CHLORIDE 0.9 % IV BOLUS: 1000.0000 mL | Freq: Once | INTRAVENOUS | Status: DC

## 2022-02-09 NOTE — ED Notes (Signed)
In n out catheretization performed. No urine obtained. Pt is dry at this time.

## 2022-02-09 NOTE — ED Provider Notes (Signed)
[facility] EMERGENCY DEPARTMENT Provider Note   CSN: [id number] Arrival date & time: 02/09/22  2155   History  Chief Complaint  Patient presents with   Altered Mental Status   Hyperglycemia    Tamara Fry is a 76 y.o. female with insulin-dependent diabetes who is currently on hospice care as of this week who presents to the emergency department for declining mental status and hyperglycemia at home this evening.  EMS called by hospice RN.  Per family patient's baseline is ANO x0 but she is able to form clear words.  At this time she is slurring her speech, making nonsensical noises, and reportedly decreased in her alertness from her baseline.  Per hospice RN CBG was greater than 600 at the patient's home and she was administered insulin though it is unclear the dosage of the insulin administered prior to arrival.  Was administered 1 L LR bolus in route as well.  I personally read this patient's medical records.  She has history of CVA, hypertension, poorly managed diabetes, and CKD stage IV.  He is not anticoagulated.  Collateral history provided by the patient's sons at the bedside he states that at her baseline she is alert and oriented only to herself.  Often has sleep disturbance throughout the night and becomes agitated in the evenings.  They state that since January she has had a decline but she has had an acute change in the last 10 days where she changed from being able to walk and participate some ADLs to becoming bedridden overnight.  They state that her speech is usually clear though nonsensical but today she has been able to produce only unintelligible speech. No established POA at this time, per family.   HPI     Home Medications Prior to Admission medications   Medication Sig Start Date End Date Taking? Authorizing Provider  acetaminophen (TYLENOL) 650 MG CR tablet Take 650 mg by mouth every 8 (eight) hours as needed for pain or fever.   Yes [provider]  insulin lispro (HUMALOG) 100 UNIT/ML injection Inject 4-16 Units into the skin 2 (two) times daily as needed for high blood sugar.   Yes [provider]  lactose free nutrition (BOOST) LIQD Take 237 mLs by mouth in the morning and at bedtime.   Yes [provider]  atorvastatin (LIPITOR) 20 MG tablet Take 1 tablet (20 mg total) by mouth daily. Patient not taking: Reported on 02/10/2022 03/10/18 01/10/19  Park Liter, MD  donepezil (ARICEPT) 10 MG tablet Take 1 tablet (10 mg total) by mouth daily. Patient not taking: Reported on 02/10/2022 06/20/15   Cameron Sprang, MD  insulin glargine (LANTUS) 100 UNIT/ML injection Inject 0.2 mLs (20 Units total) into the skin daily. Patient not taking: Reported on 02/10/2022 01/12/19   Marcell Anger, MD  nebivolol (BYSTOLIC) 10 MG tablet Take 0.5 tablets (5 mg total) by mouth daily. Patient not taking: Reported on 02/10/2022 06/17/14   Hosie Poisson, MD  sitaGLIPtin (JANUVIA) 25 MG tablet Take 1 tablet (25 mg total) by mouth daily. Patient not taking: Reported on 02/10/2022 01/11/19   Marcell Anger, MD      Allergies    Patient has no known allergies.    Review of Systems   Review of Systems  Unable to perform ROS: Mental status change    Physical Exam Updated Vital Signs BP 104/71   Pulse (!) 110   Temp 99 F (37.2 C) (Rectal)  Resp 14   SpO2 91%  Physical Exam Vitals and nursing note reviewed.  Constitutional:      Appearance: She is ill-appearing. She is not toxic-appearing.  HENT:     Head: Normocephalic and atraumatic.      Right Ear: There is impacted cerumen.     Ears:      Comments: Unable to visualize right TM due to cerumen impaction      Nose: Nose normal.     Mouth/Throat:     Mouth: Mucous membranes are dry.     Pharynx: No oropharyngeal exudate or posterior oropharyngeal erythema.  Eyes:     General: Lids are normal. Vision grossly intact.        Right eye: No  discharge.        Left eye: No discharge.     Extraocular Movements: Extraocular movements intact.     Conjunctiva/sclera: Conjunctivae normal.     Pupils: Pupils are equal, round, and reactive to light.  Neck:     Trachea: Trachea normal.  Cardiovascular:     Rate and Rhythm: Normal rate and regular rhythm.     Pulses: Normal pulses.     Heart sounds: Normal heart sounds.  Pulmonary:     Effort: Pulmonary effort is normal. No tachypnea, bradypnea, accessory muscle usage or respiratory distress.     Breath sounds: Examination of the left-lower field reveals rales. Rales present. No wheezing.  Chest:     Chest wall: No mass, lacerations, deformity, swelling, tenderness, crepitus or edema.  Abdominal:     General: Bowel sounds are normal. There is no distension.     Palpations: Abdomen is soft.     Tenderness: There is no abdominal tenderness. There is no right CVA tenderness, left CVA tenderness, guarding or rebound.  Genitourinary:    Comments: Patient in and out catheterized without any urine release Musculoskeletal:        General: No deformity.     Cervical back: Normal range of motion and neck supple.     Right lower leg: No edema.     Left lower leg: No edema.  Lymphadenopathy:     Cervical: No cervical adenopathy.  Skin:    General: Skin is warm and dry.     Capillary Refill: Capillary refill takes less than 2 seconds.       Neurological:     General: No focal deficit present.     Mental Status: She is alert.     GCS: GCS eye subscore is 4. GCS verbal subscore is 2. GCS motor subscore is 5.     Comments: Moving all extremity spontaneously without difficulty, unable to follow directions.   Psychiatric:        Mood and Affect: Mood normal.     ED Results / Procedures / Treatments   Labs (all labs ordered are listed, but only abnormal results are displayed) Labs Reviewed  CBC WITH DIFFERENTIAL/PLATELET - Abnormal; Notable for the following components:      Result  Value   WBC 22.4 (*)    Neutro Abs 19.3 (*)    Monocytes Absolute 1.4 (*)    Abs Immature Granulocytes 0.22 (*)    All other components within normal limits  COMPREHENSIVE METABOLIC PANEL - Abnormal; Notable for the following components:   Sodium 147 (*)    CO2 21 (*)    Glucose, Bld 611 (*)    BUN 55 (*)    Creatinine, Ser 1.85 (*)    Total  Protein 6.2 (*)    Albumin 2.4 (*)    GFR, Estimated 28 (*)    Anion gap 16 (*)    All other components within normal limits  BETA-HYDROXYBUTYRIC ACID - Abnormal; Notable for the following components:   Beta-Hydroxybutyric Acid 1.04 (*)    All other components within normal limits  OSMOLALITY - Abnormal; Notable for the following components:   Osmolality 361 (*)    All other components within normal limits  LACTIC ACID, PLASMA - Abnormal; Notable for the following components:   Lactic Acid, Venous 7.4 (*)    All other components within normal limits  I-STAT VENOUS BLOOD GAS, ED - Abnormal; Notable for the following components:   pCO2, Ven 39.7 (*)    pO2, Ven 70 (*)    Sodium 146 (*)    All other components within normal limits  CBG MONITORING, ED - Abnormal; Notable for the following components:   Glucose-Capillary 567 (*)    All other components within normal limits  CBG MONITORING, ED - Abnormal; Notable for the following components:   Glucose-Capillary 455 (*)    All other components within normal limits  CULTURE, BLOOD (ROUTINE X 2)  CULTURE, BLOOD (ROUTINE X 2)  URINALYSIS, ROUTINE W REFLEX MICROSCOPIC  RAPID URINE DRUG SCREEN, HOSP PERFORMED  LACTIC ACID, PLASMA    EKG None  Radiology CT Maxillofacial Wo Contrast  Result Date: 02/10/2022 CLINICAL DATA:  Initial evaluation for right mandibular swelling. EXAM: CT MAXILLOFACIAL WITHOUT CONTRAST TECHNIQUE: Multidetector CT imaging of the maxillofacial structures was performed. Multiplanar CT image reconstructions were also generated. RADIATION DOSE REDUCTION: This exam was  performed according to the departmental dose-optimization program which includes automated exposure control, adjustment of the mA and/or kV according to patient size and/or use of iterative reconstruction technique. COMPARISON:  None Available. FINDINGS: Osseous: No acute osseous abnormality. No discrete or worrisome osseous lesions. Orbits: Globes and orbital soft tissues within normal limits. Prior bilateral ocular lens replacement. Sinuses: Paranasal sinuses are largely clear. Mastoid air cells and middle ear cavities are well pneumatized and free of fluid. Soft tissues: Asymmetric enlargement with heterogeneous appearance of the right parotid gland, suspicious for acute parotitis. No obstructive stone seen within Stensen's duct. No discrete abscess or drainable fluid collection. Associated stranding noted within the overlying right parotid space, extending to partially involve the right submandibular and parapharyngeal spaces. Right submandibular gland itself within normal limits. Mild asymmetric prominence of subcentimeter upper right cervical lymph nodes, likely reactive. Limited intracranial: Age-related cerebral atrophy with chronic small vessel ischemic disease. Otherwise unremarkable. IMPRESSION: Asymmetric enlargement with heterogeneous appearance of the right parotid gland, suspicious for acute parotitis. No obstructive stone. No discrete abscess or drainable fluid collection. Electronically Signed   By: Jeannine Boga M.D.   On: 02/10/2022 00:46   CT HEAD WO CONTRAST (5MM)  Result Date: 02/10/2022 CLINICAL DATA:  Delirium. EXAM: CT HEAD WITHOUT CONTRAST TECHNIQUE: Contiguous axial images were obtained from the base of the skull through the vertex without intravenous contrast. RADIATION DOSE REDUCTION: This exam was performed according to the departmental dose-optimization program which includes automated exposure control, adjustment of the mA and/or kV according to patient size and/or use of  iterative reconstruction technique. COMPARISON:  Head CT 06/16/2014 FINDINGS: Brain: Progressive atrophy from 2015. Progressive periventricular chronic small vessel ischemic change. No hemorrhage or evidence of acute ischemia. No subdural or extra-axial collection. No midline shift, mass lesion or mass effect. Bifrontal atrophy but no extra-axial collection. Enlarged partially empty sella. Vascular:  Atherosclerosis of skullbase vasculature without hyperdense vessel or abnormal calcification. Skull: No fracture or focal lesion. Incidental frontal hyperostosis. Sinuses/Orbits: Paranasal sinuses and mastoid air cells are clear. The visualized orbits are unremarkable. Other: None. IMPRESSION: 1. No acute intracranial abnormality. 2. Progressive atrophy from 2015. Progressive chronic small vessel ischemic change. Electronically Signed   By: Keith Rake M.D.   On: 02/10/2022 00:17   DG Chest 2 View  Result Date: 02/09/2022 CLINICAL DATA:  Altered mental status. EXAM: CHEST - 2 VIEW COMPARISON:  12/10/2015 FINDINGS: Mild patient rotation with anti lordotic positioning, difficulty positioning due to medical condition.The cardiomediastinal contours are normal. Skin folds project over both hemi thoraces. Pulmonary vasculature is normal. No consolidation, pleural effusion, or pneumothorax. No acute osseous abnormalities are seen. IMPRESSION: No acute chest findings. Electronically Signed   By: Keith Rake M.D.   On: 02/09/2022 22:52    Procedures .Critical Care  Performed by: Emeline Darling, PA-C Authorized by: Emeline Darling, PA-C   Critical care provider statement:    Critical care time (minutes):  45   Critical care was time spent personally by me on the following activities:  Development of treatment plan with patient or surrogate, discussions with consultants, evaluation of patient's response to treatment, examination of patient, obtaining history from patient or surrogate, ordering  and performing treatments and interventions, ordering and review of laboratory studies, ordering and review of radiographic studies, pulse oximetry and re-evaluation of patient's condition    Medications Ordered in ED Medications  insulin regular, human (MYXREDLIN) 100 units/ 100 mL infusion (10.5 Units/hr Intravenous New Bag/Given 02/10/22 0100)  lactated ringers infusion ( Intravenous New Bag/Given 02/10/22 0107)  dextrose 5 % in lactated ringers infusion (0 mLs Intravenous Hold 02/10/22 0054)  dextrose 50 % solution 0-50 mL (has no administration in time range)  potassium chloride 10 mEq in 100 mL IVPB (10 mEq Intravenous New Bag/Given 02/10/22 0108)  lactated ringers infusion (0 mLs Intravenous Hold 02/10/22 0111)  cefTRIAXone (ROCEPHIN) 2 g in sodium chloride 0.9 % 100 mL IVPB (has no administration in time range)  lactated ringers bolus 1,000 mL (0 mLs Intravenous Stopped 02/10/22 0054)  insulin aspart (novoLOG) injection 8 Units (8 Units Intravenous Given 02/09/22 2358)  lactated ringers bolus ( Intravenous New Bag/Given 02/10/22 0108)    ED Course/ Medical Decision Making/ A&P Clinical Course as of 02/10/22 0122  Wed Feb 10, 2022  0119 Consult to Dr. Alcario Drought, hospitalist, who will admit this patient to his service for management of her HHS and sepsis. Suspected source right sided parotitis, versus cellulitis. Cannot r/o UTI, given inability lack of urine output on in and out catheterization. Will plan to recatheterize if necessary after fluid boluses.  [RS]    Clinical Course User Index [RS] Penny Arrambide, Gypsy Balsam, PA-C                           Medical Decision Making 76 year old female with advanced dementia who presents with concern for increased agitation and significant elevated blood sugars today  per hospice nurse.  Tachycardic on intake, vitals otherwise normal.  Cardiopulmonary and abdominal exams are benign.  Patient with swelling and tenderness palpation over the angle of the  right mandible with exquisite tenderness palpation in this area.  Multiple decubitus ulcers with some purulent discharge from that over the left ischium.  Question possible UTI, however patient was catheterized by RN and there was no urine output despite confirmation  of placement of the catheter within the urethra.  The neck catheter was removed without collection of urine.  Will attempt again after patient has been hydrated via IV.  CBG 567 upon arrival to the ED.  The differential diagnosis for AMS is extensive and includes, but is not limited to:  Drug overdose - opioids, alcohol, sedatives, antipsychotics, drug withdrawal, others Metabolic: hypoxia, hypoglycemia, hyperglycemia, hypercalcemia, hypernatremia, hyponatremia, uremia, hepatic encephalopathy, hypothyroidism, hyperthyroidism, vitamin B12 or thiamine deficiency, carbon monoxide poisoning, Wilson's disease, Lactic acidosis, DKA/HHOS Infectious: meningitis, encephalitis, bacteremia/sepsis, urinary tract infection, pneumonia, neurosyphilis Structural: Space-occupying lesion, (brain tumor, subdural hematoma, hydrocephalus,) Vascular: stroke, subarachnoid hemorrhage, coronary ischemia, hypertensive encephalopathy, CNS vasculitis, thrombotic thrombocytopenic purpura, disseminated intravascular coagulation, hyperviscosity Psychiatric: Schizophrenia, depression; Other: Seizure, hypothermia, heat stroke, ICU psychosis, dementia -"sundowning."      Amount and/or Complexity of Data Reviewed Labs:     Details: CBC with leukocytosis of 22,000 without anemia.  CMP with mild hypernatremia 147, hyperglycemia to 611.  Creatinine at patient's baseline of 1.8.  Osmolality significantly weighted to 361, beta hydroxybutyric acid elevated to 1.  VBG with normal pH.  Anion gap on CMP elevated to 16.  Blood cultures pending. LActic 7.4, critically elevated.  UA to be collected when patient may make urine. Radiology: ordered.    Details: Chest x-Quinney negative  for acute cardiopulmonary disease.  CT head negative for acute intracranial abnormality.  CT maxillofacial with right-sided parotid gland enlargement concerning for parotitis without abscess.  Risk Prescription drug management.   Patient started on antibiosis with Rocephin.  Given patient's tachycardia and suspected sources of infection including decubitus wounds and right-sided parotitis, patient meets sepsis criteria.  Extensive discussion had with the patient's sons at the bedside regarding her management plan in context of patient on hospice.  They expressed wishes to proceed with management of HHS and treatment with antibiotics for septic presentation.  CODE STATUS pending family discussion.  Consult to hospitalist Dr. Alcario Drought who is agreeable to seeing this patient and admitting her to his service.  I appreciate his collaboration in the care of this patient.  Tiandra's family voiced understanding of her medical evaluation and treatment plan. Each of their questions answered to their expressed satisfaction.  This chart was dictated using voice recognition software, Dragon. Despite the best efforts of this provider to proofread and correct errors, errors may still occur which can change documentation meaning.  Final Clinical Impression(s) / ED Diagnoses Final diagnoses:  None    Rx / DC Orders ED Discharge Orders     None         Aura Dials 02/10/22 0132    Regan Lemming, MD 02/10/22 [phone]

## 2022-02-10 ENCOUNTER — Inpatient Hospital Stay (HOSPITAL_COMMUNITY): Payer: Medicare Other

## 2022-02-10 DIAGNOSIS — N2889 Other specified disorders of kidney and ureter: Secondary | ICD-10-CM | POA: Diagnosis not present

## 2022-02-10 DIAGNOSIS — I10 Essential (primary) hypertension: Secondary | ICD-10-CM | POA: Diagnosis not present

## 2022-02-10 DIAGNOSIS — K1121 Acute sialoadenitis: Secondary | ICD-10-CM

## 2022-02-10 DIAGNOSIS — I129 Hypertensive chronic kidney disease with stage 1 through stage 4 chronic kidney disease, or unspecified chronic kidney disease: Secondary | ICD-10-CM | POA: Diagnosis present

## 2022-02-10 DIAGNOSIS — D72829 Elevated white blood cell count, unspecified: Secondary | ICD-10-CM

## 2022-02-10 DIAGNOSIS — E872 Acidosis, unspecified: Secondary | ICD-10-CM

## 2022-02-10 DIAGNOSIS — L89150 Pressure ulcer of sacral region, unstageable: Secondary | ICD-10-CM | POA: Diagnosis present

## 2022-02-10 DIAGNOSIS — N390 Urinary tract infection, site not specified: Secondary | ICD-10-CM | POA: Diagnosis present

## 2022-02-10 DIAGNOSIS — B958 Unspecified staphylococcus as the cause of diseases classified elsewhere: Secondary | ICD-10-CM | POA: Diagnosis present

## 2022-02-10 DIAGNOSIS — A419 Sepsis, unspecified organism: Secondary | ICD-10-CM

## 2022-02-10 DIAGNOSIS — E870 Hyperosmolality and hypernatremia: Secondary | ICD-10-CM

## 2022-02-10 DIAGNOSIS — L8990 Pressure ulcer of unspecified site, unspecified stage: Secondary | ICD-10-CM

## 2022-02-10 DIAGNOSIS — E279 Disorder of adrenal gland, unspecified: Secondary | ICD-10-CM | POA: Diagnosis not present

## 2022-02-10 DIAGNOSIS — R627 Adult failure to thrive: Secondary | ICD-10-CM

## 2022-02-10 DIAGNOSIS — R4189 Other symptoms and signs involving cognitive functions and awareness: Secondary | ICD-10-CM | POA: Diagnosis present

## 2022-02-10 DIAGNOSIS — F03918 Unspecified dementia, unspecified severity, with other behavioral disturbance: Secondary | ICD-10-CM | POA: Diagnosis present

## 2022-02-10 DIAGNOSIS — E785 Hyperlipidemia, unspecified: Secondary | ICD-10-CM | POA: Diagnosis present

## 2022-02-10 DIAGNOSIS — N184 Chronic kidney disease, stage 4 (severe): Secondary | ICD-10-CM

## 2022-02-10 DIAGNOSIS — N179 Acute kidney failure, unspecified: Secondary | ICD-10-CM

## 2022-02-10 DIAGNOSIS — E1100 Type 2 diabetes mellitus with hyperosmolarity without nonketotic hyperglycemic-hyperosmolar coma (NKHHC): Secondary | ICD-10-CM | POA: Diagnosis not present

## 2022-02-10 DIAGNOSIS — Z7189 Other specified counseling: Secondary | ICD-10-CM | POA: Diagnosis not present

## 2022-02-10 DIAGNOSIS — Z66 Do not resuscitate: Secondary | ICD-10-CM | POA: Diagnosis present

## 2022-02-10 DIAGNOSIS — G9340 Encephalopathy, unspecified: Secondary | ICD-10-CM | POA: Diagnosis not present

## 2022-02-10 DIAGNOSIS — E1065 Type 1 diabetes mellitus with hyperglycemia: Secondary | ICD-10-CM | POA: Diagnosis present

## 2022-02-10 DIAGNOSIS — G9341 Metabolic encephalopathy: Secondary | ICD-10-CM | POA: Diagnosis present

## 2022-02-10 DIAGNOSIS — R6521 Severe sepsis with septic shock: Secondary | ICD-10-CM | POA: Diagnosis present

## 2022-02-10 DIAGNOSIS — L89100 Pressure ulcer of unspecified part of back, unstageable: Secondary | ICD-10-CM | POA: Diagnosis present

## 2022-02-10 DIAGNOSIS — Z515 Encounter for palliative care: Secondary | ICD-10-CM | POA: Diagnosis not present

## 2022-02-10 DIAGNOSIS — Z681 Body mass index (BMI) 19 or less, adult: Secondary | ICD-10-CM | POA: Diagnosis not present

## 2022-02-10 DIAGNOSIS — L89303 Pressure ulcer of unspecified buttock, stage 3: Secondary | ICD-10-CM | POA: Diagnosis not present

## 2022-02-10 DIAGNOSIS — J189 Pneumonia, unspecified organism: Secondary | ICD-10-CM | POA: Diagnosis present

## 2022-02-10 DIAGNOSIS — Z8673 Personal history of transient ischemic attack (TIA), and cerebral infarction without residual deficits: Secondary | ICD-10-CM | POA: Diagnosis not present

## 2022-02-10 DIAGNOSIS — E1022 Type 1 diabetes mellitus with diabetic chronic kidney disease: Secondary | ICD-10-CM | POA: Diagnosis present

## 2022-02-10 LAB — BLOOD CULTURE ID PANEL (REFLEXED) - BCID2

## 2022-02-10 LAB — BASIC METABOLIC PANEL
Anion gap: 13 (ref 5–15)
Anion gap: 12 (ref 5–15)
Anion gap: 9 (ref 5–15)
Anion gap: 12 (ref 5–15)
BUN: 49 mg/dL — ABNORMAL HIGH (ref 8–23)
BUN: 48 mg/dL — ABNORMAL HIGH (ref 8–23)
BUN: 41 mg/dL — ABNORMAL HIGH (ref 8–23)
BUN: 33 mg/dL — ABNORMAL HIGH (ref 8–23)
CO2: 24 mmol/L (ref 22–32)
CO2: 25 mmol/L (ref 22–32)
CO2: 25 mmol/L (ref 22–32)
CO2: 18 mmol/L — ABNORMAL LOW (ref 22–32)
Calcium: 9.6 mg/dL (ref 8.9–10.3)
Calcium: 9.7 mg/dL (ref 8.9–10.3)
Calcium: 9.3 mg/dL (ref 8.9–10.3)
Calcium: 8.4 mg/dL — ABNORMAL LOW (ref 8.9–10.3)
Chloride: 111 mmol/L (ref 98–111)
Chloride: 113 mmol/L — ABNORMAL HIGH (ref 98–111)
Chloride: 112 mmol/L — ABNORMAL HIGH (ref 98–111)
Chloride: 114 mmol/L — ABNORMAL HIGH (ref 98–111)
Creatinine, Ser: 1.5 mg/dL — ABNORMAL HIGH (ref 0.44–1.00)
Creatinine, Ser: 1.28 mg/dL — ABNORMAL HIGH (ref 0.44–1.00)
Creatinine, Ser: 1.17 mg/dL — ABNORMAL HIGH (ref 0.44–1.00)
Creatinine, Ser: 0.76 mg/dL (ref 0.44–1.00)
GFR, Estimated: 36 mL/min — ABNORMAL LOW (ref >60)
GFR, Estimated: 43 mL/min — ABNORMAL LOW (ref >60)
GFR, Estimated: 48 mL/min — ABNORMAL LOW (ref >60)
GFR, Estimated: >60 mL/min (ref >60)
Glucose, Bld: 355 mg/dL — ABNORMAL HIGH (ref 70–99)
Glucose, Bld: 198 mg/dL — ABNORMAL HIGH (ref 70–99)
Glucose, Bld: 180 mg/dL — ABNORMAL HIGH (ref 70–99)
Glucose, Bld: 183 mg/dL — ABNORMAL HIGH (ref 70–99)
Potassium: 4.7 mmol/L (ref 3.5–5.1)
Potassium: 4 mmol/L (ref 3.5–5.1)
Potassium: 4.4 mmol/L (ref 3.5–5.1)
Potassium: 4.5 mmol/L (ref 3.5–5.1)
Sodium: 148 mmol/L — ABNORMAL HIGH (ref 135–145)
Sodium: 150 mmol/L — ABNORMAL HIGH (ref 135–145)
Sodium: 146 mmol/L — ABNORMAL HIGH (ref 135–145)
Sodium: 144 mmol/L (ref 135–145)

## 2022-02-10 LAB — RAPID URINE DRUG SCREEN, HOSP PERFORMED
Amphetamines: NOT DETECTED
Barbiturates: NOT DETECTED
Benzodiazepines: NOT DETECTED
Cocaine: NOT DETECTED
Opiates: NOT DETECTED
Tetrahydrocannabinol: NOT DETECTED

## 2022-02-10 LAB — CBG MONITORING, ED
Glucose-Capillary: 122 mg/dL — ABNORMAL HIGH (ref 70–99)
Glucose-Capillary: 136 mg/dL — ABNORMAL HIGH (ref 70–99)
Glucose-Capillary: 143 mg/dL — ABNORMAL HIGH (ref 70–99)
Glucose-Capillary: 145 mg/dL — ABNORMAL HIGH (ref 70–99)
Glucose-Capillary: 150 mg/dL — ABNORMAL HIGH (ref 70–99)
Glucose-Capillary: 161 mg/dL — ABNORMAL HIGH (ref 70–99)
Glucose-Capillary: 175 mg/dL — ABNORMAL HIGH (ref 70–99)
Glucose-Capillary: 216 mg/dL — ABNORMAL HIGH (ref 70–99)
Glucose-Capillary: 336 mg/dL — ABNORMAL HIGH (ref 70–99)
Glucose-Capillary: 350 mg/dL — ABNORMAL HIGH (ref 70–99)
Glucose-Capillary: 455 mg/dL — ABNORMAL HIGH (ref 70–99)

## 2022-02-10 LAB — OSMOLALITY: Osmolality: 357 mOsm/kg (ref 275–295)

## 2022-02-10 LAB — COMPREHENSIVE METABOLIC PANEL
ALT: 18 U/L (ref 0–44)
AST: 27 U/L (ref 15–41)
Albumin: 2.4 g/dL — ABNORMAL LOW (ref 3.5–5.0)
Alkaline Phosphatase: 116 U/L (ref 38–126)
Anion gap: 16 — ABNORMAL HIGH (ref 5–15)
BUN: 55 mg/dL — ABNORMAL HIGH (ref 8–23)
CO2: 21 mmol/L — ABNORMAL LOW (ref 22–32)
Calcium: 10.1 mg/dL (ref 8.9–10.3)
Chloride: 110 mmol/L (ref 98–111)
Creatinine, Ser: 1.85 mg/dL — ABNORMAL HIGH (ref 0.44–1.00)
GFR, Estimated: 28 mL/min — ABNORMAL LOW (ref >60)
Glucose, Bld: 611 mg/dL (ref 70–99)
Potassium: 4.1 mmol/L (ref 3.5–5.1)
Sodium: 147 mmol/L — ABNORMAL HIGH (ref 135–145)
Total Bilirubin: 0.9 mg/dL (ref 0.3–1.2)
Total Protein: 6.2 g/dL — ABNORMAL LOW (ref 6.5–8.1)

## 2022-02-10 LAB — URINALYSIS, ROUTINE W REFLEX MICROSCOPIC
Bilirubin Urine: NEGATIVE
Glucose, UA: >=500 mg/dL — AB
Ketones, ur: NEGATIVE mg/dL
Nitrite: POSITIVE — AB
Protein, ur: NEGATIVE mg/dL
Specific Gravity, Urine: 1.015 (ref 1.005–1.030)
pH: 5.5 (ref 5.0–8.0)

## 2022-02-10 LAB — BETA-HYDROXYBUTYRIC ACID: Beta-Hydroxybutyric Acid: 1.04 mmol/L — ABNORMAL HIGH (ref 0.05–0.27)

## 2022-02-10 LAB — GLUCOSE, CAPILLARY: Glucose-Capillary: 193 mg/dL — ABNORMAL HIGH (ref 70–99)

## 2022-02-10 LAB — URINALYSIS, MICROSCOPIC (REFLEX): WBC, UA: >50 WBC/hpf (ref 0–5)

## 2022-02-10 LAB — LACTIC ACID, PLASMA
Lactic Acid, Venous: 5.7 mmol/L (ref 0.5–1.9)
Lactic Acid, Venous: 6.9 mmol/L (ref 0.5–1.9)
Lactic Acid, Venous: 7.3 mmol/L (ref 0.5–1.9)
Lactic Acid, Venous: 7.4 mmol/L (ref 0.5–1.9)
Lactic Acid, Venous: >9 mmol/L (ref 0.5–1.9)

## 2022-02-10 LAB — PROCALCITONIN: Procalcitonin: 0.48 ng/mL

## 2022-02-10 LAB — MRSA NEXT GEN BY PCR, NASAL: MRSA by PCR Next Gen: NOT DETECTED

## 2022-02-10 MED ORDER — VANCOMYCIN HCL IN DEXTROSE 1-5 GM/200ML-% IV SOLN
1000.0000 mg | Freq: Once | INTRAVENOUS | Status: AC
  Administered 2022-02-10: 1000 mg via INTRAVENOUS
  Filled 2022-02-10: qty 200

## 2022-02-10 MED ORDER — SODIUM CHLORIDE 0.9 % IV SOLN
2.0000 g | INTRAVENOUS | Status: DC
  Administered 2022-02-10: 2 g via INTRAVENOUS
  Filled 2022-02-10: qty 20

## 2022-02-10 MED ORDER — DEXTROSE IN LACTATED RINGERS 5 % IV SOLN: INTRAVENOUS | Status: DC

## 2022-02-10 MED ORDER — LACTATED RINGERS IV SOLN: INTRAVENOUS | Status: DC

## 2022-02-10 MED ORDER — CEFAZOLIN SODIUM-DEXTROSE 1-4 GM/50ML-% IV SOLN: 1.0000 g | Freq: Once | INTRAVENOUS | Status: DC

## 2022-02-10 MED ORDER — LORAZEPAM 2 MG/ML IJ SOLN: 1.0000 mg | Freq: Once | INTRAMUSCULAR | Status: DC

## 2022-02-10 MED ORDER — LACTATED RINGERS IV BOLUS: Freq: Once | INTRAVENOUS | Status: AC

## 2022-02-10 MED ORDER — LORAZEPAM 2 MG/ML IJ SOLN
1.0000 mg | Freq: Four times a day (QID) | INTRAMUSCULAR | Status: DC | PRN
  Administered 2022-02-10: 1 mg via INTRAVENOUS
  Filled 2022-02-10 (×2): qty 1

## 2022-02-10 MED ORDER — HALOPERIDOL LACTATE 5 MG/ML IJ SOLN
0.5000 mg | Freq: Four times a day (QID) | INTRAMUSCULAR | Status: DC | PRN
  Administered 2022-02-10: 0.5 mg via INTRAVENOUS
  Filled 2022-02-10: qty 1

## 2022-02-10 MED ORDER — SODIUM CHLORIDE 0.9 % IV BOLUS
1000.0000 mL | Freq: Once | INTRAVENOUS | Status: AC
  Administered 2022-02-10: 1000 mL via INTRAVENOUS

## 2022-02-10 MED ORDER — LORAZEPAM 2 MG/ML IJ SOLN
0.5000 mg | Freq: Four times a day (QID) | INTRAMUSCULAR | Status: DC | PRN
  Administered 2022-02-10: 0.5 mg via INTRAVENOUS

## 2022-02-10 MED ORDER — VANCOMYCIN HCL IN DEXTROSE 1-5 GM/200ML-% IV SOLN: 1000.0000 mg | INTRAVENOUS | Status: DC

## 2022-02-10 MED ORDER — SODIUM CHLORIDE 0.9 % IV SOLN
2.0000 g | Freq: Two times a day (BID) | INTRAVENOUS | Status: DC
  Administered 2022-02-10 – 2022-02-11 (×3): 2 g via INTRAVENOUS
  Filled 2022-02-10 (×3): qty 12.5

## 2022-02-10 MED ORDER — ENOXAPARIN SODIUM 30 MG/0.3ML IJ SOSY
30.0000 mg | PREFILLED_SYRINGE | INTRAMUSCULAR | Status: DC
  Administered 2022-02-10 – 2022-02-11 (×2): 30 mg via SUBCUTANEOUS
  Filled 2022-02-10 (×2): qty 0.3

## 2022-02-10 MED ORDER — IOHEXOL 350 MG/ML SOLN
100.0000 mL | Freq: Once | INTRAVENOUS | Status: AC | PRN
  Administered 2022-02-10: 100 mL via INTRAVENOUS

## 2022-02-10 MED ORDER — LACTATED RINGERS IV BOLUS
1000.0000 mL | Freq: Once | INTRAVENOUS | Status: AC
  Administered 2022-02-10: 1000 mL via INTRAVENOUS

## 2022-02-10 MED ORDER — MORPHINE SULFATE (PF) 2 MG/ML IV SOLN
1.0000 mg | INTRAVENOUS | Status: DC | PRN
  Administered 2022-02-10 (×3): 1 mg via INTRAVENOUS
  Filled 2022-02-10 (×3): qty 1

## 2022-02-10 MED ORDER — INSULIN DETEMIR 100 UNIT/ML ~~LOC~~ SOLN
5.0000 [IU] | Freq: Two times a day (BID) | SUBCUTANEOUS | Status: DC
  Administered 2022-02-10 – 2022-02-11 (×3): 5 [IU] via SUBCUTANEOUS
  Filled 2022-02-10 (×4): qty 0.05

## 2022-02-10 MED ORDER — INSULIN ASPART 100 UNIT/ML IJ SOLN
1.0000 [IU] | INTRAMUSCULAR | Status: DC
  Administered 2022-02-10: 2 [IU] via SUBCUTANEOUS
  Administered 2022-02-11: 1 [IU] via SUBCUTANEOUS

## 2022-02-10 MED ORDER — POTASSIUM CHLORIDE 10 MEQ/100ML IV SOLN
10.0000 meq | INTRAVENOUS | Status: AC
  Administered 2022-02-10 (×2): 10 meq via INTRAVENOUS
  Filled 2022-02-10 (×2): qty 100

## 2022-02-10 MED ORDER — INSULIN REGULAR(HUMAN) IN NACL 100-0.9 UT/100ML-% IV SOLN
INTRAVENOUS | Status: DC
  Administered 2022-02-10: 10.5 [IU]/h via INTRAVENOUS
  Filled 2022-02-10: qty 100

## 2022-02-10 MED ORDER — DEXTROSE 50 % IV SOLN
0.0000 mL | INTRAVENOUS | Status: DC | PRN
  Administered 2022-02-11 (×2): 50 mL via INTRAVENOUS
  Filled 2022-02-10 (×2): qty 50

## 2022-02-10 MED ORDER — METRONIDAZOLE 500 MG/100ML IV SOLN
500.0000 mg | Freq: Two times a day (BID) | INTRAVENOUS | Status: DC
  Administered 2022-02-10 – 2022-02-11 (×4): 500 mg via INTRAVENOUS
  Filled 2022-02-10 (×4): qty 100

## 2022-02-10 MED ORDER — MEDIHONEY WOUND/BURN DRESSING EX PSTE
1.0000 | PASTE | Freq: Every day | CUTANEOUS | Status: DC
  Administered 2022-02-10 – 2022-02-13 (×4): 1 via TOPICAL
  Filled 2022-02-10 (×2): qty 44

## 2022-02-10 MED ORDER — LACTATED RINGERS IV SOLN
INTRAVENOUS | Status: DC
Start: 2022-02-10 — End: 2022-02-11

## 2022-02-10 NOTE — Assessment & Plan Note (Addendum)
Creat of 1.8 actually looks to be her baseline.

## 2022-02-10 NOTE — Assessment & Plan Note (Addendum)
Transitioned to DNR 6/28.   Comfort measures Palliative care c/s

## 2022-02-10 NOTE — Assessment & Plan Note (Addendum)
Leukocytosis, tachycardia, tachypnea, intermittent hypotension, lactic >9 and concern for parotitis vs infected decubitus ulcer (UTI and pneumonia other potential sources with UA and small R effusion).  Notably 2/2 cultures with staph epidermidis (repeat cultures pending), but comfort measures decided after palliative discussion.

## 2022-02-10 NOTE — H&P (Addendum)
History and Physical    Patient: Tamara Fry JOI:[id number] DOB: [birth year] DOA: 02/09/2022 DOS: the patient was seen and examined on 02/10/2022 PCP: Jani Gravel, MD  Patient coming from: Home  Chief Complaint:  Chief Complaint  Patient presents with   Altered Mental Status   Hyperglycemia   HPI: Tamara Fry is a 76 y.o. female with medical history significant of advanced dementia, just started on hospice.  H/o CVA,   Pt in to ED for declining mental status and hyperglycemia at home this evening.  Per family:  Pt AAOx0 at baseline but is able to form clear words.   At this time she is slurring her speech, making nonsensical noises, and reportedly decreased in her alertness from her baseline.  Acute change in mental status ongoing over past 10 days per family.  Normally able to walk and participate in some ADLs, bedridden for past 10 days.  Pt unable to contribute to history.   Review of Systems: unable to review all systems due to the inability of the patient to answer questions. Past Medical History:  Diagnosis Date   Chronic kidney disease    Stage 4   Depression    Diabetes mellitus    Type I per office notes   Hyperlipidemia    Hypertension    Memory loss    "short term"   Migraine    Stroke ([place])    TIA - no residual   Past Surgical History:  Procedure Laterality Date   ABDOMINAL HYSTERECTOMY     ANKLE ARTHODESIS W/ ARTHROSCOPY Left    CATARACT EXTRACTION Bilateral    CHOLECYSTECTOMY     WRIST FRACTURE SURGERY     Social History:  reports that she has quit smoking. Her smoking use included cigarettes. She has a 5.00 pack-year smoking history. She has never used smokeless tobacco. She reports that she does not drink alcohol and does not use drugs.  No Known Allergies  Family History  Problem Relation Age of Onset   Hypertension Mother    Diabetes Mellitus II Father    Prostate cancer Father     Prior to Admission medications   Medication Sig Start Date  End Date Taking? Authorizing Provider  acetaminophen (TYLENOL) 650 MG CR tablet Take 650 mg by mouth every 8 (eight) hours as needed for pain or fever.   Yes [provider]  insulin lispro (HUMALOG) 100 UNIT/ML injection Inject 4-16 Units into the skin 2 (two) times daily as needed for high blood sugar.   Yes [provider]  lactose free nutrition (BOOST) LIQD Take 237 mLs by mouth in the morning and at bedtime.   Yes [provider]  atorvastatin (LIPITOR) 20 MG tablet Take 1 tablet (20 mg total) by mouth daily. Patient not taking: Reported on 02/10/2022 03/10/18 01/10/19  Park Liter, MD  donepezil (ARICEPT) 10 MG tablet Take 1 tablet (10 mg total) by mouth daily. Patient not taking: Reported on 02/10/2022 06/20/15   Cameron Sprang, MD  insulin glargine (LANTUS) 100 UNIT/ML injection Inject 0.2 mLs (20 Units total) into the skin daily. Patient not taking: Reported on 02/10/2022 01/12/19   Marcell Anger, MD  nebivolol (BYSTOLIC) 10 MG tablet Take 0.5 tablets (5 mg total) by mouth daily. Patient not taking: Reported on 02/10/2022 06/17/14   Hosie Poisson, MD  sitaGLIPtin (JANUVIA) 25 MG tablet Take 1 tablet (25 mg total) by mouth daily. Patient not taking: Reported on 02/10/2022 01/11/19   Nicolette Bang  N, MD    Physical Exam: Vitals:   02/09/22 2345 02/10/22 0000 02/10/22 0050 02/10/22 0115  BP: 115/71 107/72 130/63 104/71  Pulse: 100 99 (!) 111 (!) 110  Resp: 13 16 14 14   Temp:      TempSrc:      SpO2: 100% 99% 93% 91%   Constitutional: Awake, agitated, ill appearing Eyes: PERRL, lids and conjunctivae normal ENMT: Swelling of R parotid gland noted Neck: normal, supple, no masses, no thyromegaly Respiratory: Rales present Cardiovascular: Regular rate and rhythm, no murmurs / rubs / gallops. No extremity edema. 2+ pedal pulses. No carotid bruits.  Abdomen: no tenderness, no masses palpated. No hepatosplenomegaly. Bowel sounds positive.   Musculoskeletal: no clubbing / cyanosis. No joint deformity upper and lower extremities. Good ROM, no contractures. Normal muscle tone.  Skin: no rashes, lesions, ulcers. No induration Neurologic: MAE, waving arms around (risking pulling her IV out) Psychiatric: AAOx0, nonsensical sounds mostly, few understandable words.  Data Reviewed:    Lactate 7.4, OSM 3.61, BHB 1.0  CMP     Component Value Date/Time   NA 146 (H) 02/09/2022 2321   K 4.7 02/09/2022 2321   CL 110 02/09/2022 2304   CO2 21 (L) 02/09/2022 2304   GLUCOSE 611 (HH) 02/09/2022 2304   BUN 55 (H) 02/09/2022 2304   CREATININE 1.85 (H) 02/09/2022 2304   CREATININE 1.54 (H) 05/04/2014 1509   CALCIUM 10.1 02/09/2022 2304   PROT 6.2 (L) 02/09/2022 2304   PROT 6.8 03/09/2018 1640   ALBUMIN 2.4 (L) 02/09/2022 2304   ALBUMIN 4.0 03/09/2018 1640   AST 27 02/09/2022 2304   ALT 18 02/09/2022 2304   ALKPHOS 116 02/09/2022 2304   BILITOT 0.9 02/09/2022 2304   BILITOT 0.3 03/09/2018 1640   GFRNONAA 28 (L) 02/09/2022 2304   GFRAA 31 (L) 01/11/2019 0500       Latest Ref Rng & Units 02/09/2022   11:21 PM 02/09/2022   11:04 PM 01/11/2019    5:00 AM  CBC  WBC 4.0 - 10.5 K/uL  22.4  6.1   Hemoglobin 12.0 - 15.0 g/dL 12.6  12.6  10.7   Hematocrit 36.0 - 46.0 % 37.0  39.7  32.6   Platelets 150 - 400 K/uL  274  149     CT head: progressive cerebral atrophy  CT maxillofacial: suspicious for acute parotitis R parotid gland  Assessment and Plan: * Hyperosmolar hyperglycemic state (HHS) ([place]) Hyperosmolar state due to combination of hyperglycemia and hypernatremia with severe dehydration. HHS pathway IV insulin per pathway 2L bolus in ED and then LR at 150 cc/hr BMP Q4H NPO for the moment Tele monitor  Hypernatremia Corrected sodium 159! Suspect hypovolemic hyponatremia (profound dehydration), see HHS above regarding treatment plan.  Acute encephalopathy Acute metabolic encephalopathy due to hyperosmolar state (HHS  though more just hypernatremia with corrected sodium of 159) superimposed on chronic dementia. Patient now agitated, risking pulling out her IV which we need to try and treat HHS. Will put in for PRN ativan. Family hasnt made any decisions regarding DNR at this point though pt is "in hospice". Sounds like shes a full scope of treatment for the moment / full code.  Decubitus ulcers Multiple ulcers on bottom and sacrum. ? Source of infection.  Leukocytosis Possibly just stress response vs occult infection. Wasn't able to get UA due to no urine in bladder. Rales in chest, though CXR neg and no O2 requirement. Does have multiple decubiti on back side, one with purulent  drainage, stage 2-3 EDP gave Rocephin -> continue empirically for cellulitis for the moment Will check procalcitonin Initial lactate elevated, though that may just be secondary to profound dehydration. Repeat pending. Check MRSA PCR nares  CKD (chronic kidney disease) stage 4, GFR 15-29 ml/min (HCC) Possibly superimposed AKI. Creat of 1.8 actually looks to be her baseline. Though her BUN has doubled. ? Muscle wasting as cause for creat not being more elevated today, possibly masking an AKI? IVF as above Strict intake and output Serial BMPs as above I+O cath yielded no urine today in ED  Essential hypertension Monitor for the moment Treat if significant elevation in BP.  Persistent cognitive impairment AAOx0 at baseline it sounds like See acute encephalopathy above.      Advance Care Planning:   Code Status: Full Code  Consults: None  Family Communication: No family in room  Severity of Illness: The appropriate patient status for this patient is INPATIENT. Inpatient status is judged to be reasonable and necessary in order to provide the required intensity of service to ensure the patient's safety. The patient's presenting symptoms, physical exam findings, and initial radiographic and laboratory data in the  context of their chronic comorbidities is felt to place them at high risk for further clinical deterioration. Furthermore, it is not anticipated that the patient will be medically stable for discharge from the hospital within 2 midnights of admission.   * I certify that at the point of admission it is my clinical judgment that the patient will require inpatient hospital care spanning beyond 2 midnights from the point of admission due to high intensity of service, high risk for further deterioration and high frequency of surveillance required.*  Author: Etta Quill., DO 02/10/2022 1:52 AM  For on call review [email or web address].

## 2022-02-10 NOTE — Assessment & Plan Note (Addendum)
Hold BP meds

## 2022-02-10 NOTE — Assessment & Plan Note (Addendum)
Related to hyperglycemia, hypernatremia, and infection Head CT without acute abnormality  Comfort measures as above

## 2022-02-10 NOTE — Progress Notes (Signed)
Notified provider of need to order repeat lactic acid. ° °

## 2022-02-10 NOTE — Assessment & Plan Note (Deleted)
Possibly just stress response vs occult infection. Wasn't able to get UA due to no urine in bladder. Rales in chest, though CXR neg and no O2 requirement. Does have multiple decubiti on back side, one with purulent drainage, stage 2-3 1. EDP gave Rocephin -> continue empirically for cellulitis for the moment 2. Will check procalcitonin 3. Initial lactate elevated, though that may just be secondary to profound dehydration. 1. Repeat pending. 4. Check MRSA PCR nares

## 2022-02-10 NOTE — Assessment & Plan Note (Addendum)
Question of acute parotitis of R parotid gland

## 2022-02-10 NOTE — Progress Notes (Signed)
Repeat lactate essentially unchanged 7.4 -> 7.3  But I think this is because IVF bolus wasn't yet started when 2nd lactate was drawn.  IVF bolus now done.  3rd lactate ordered.

## 2022-02-10 NOTE — Assessment & Plan Note (Signed)
Due to sepsis Follow with IVF Gradually downtrending

## 2022-02-10 NOTE — Assessment & Plan Note (Addendum)
Unstageable, suspect infected, foul smelling CT abd/pelvis without clear explanation for leukocytosis -> no well defined decubitus ulcer seen  Now comfort measures

## 2022-02-10 NOTE — Assessment & Plan Note (Addendum)
Creatinine improved, but lack of UOP Follow bladder scans Follow CT abd/pelvis  I/O as needed, will trend with IVF

## 2022-02-10 NOTE — Consult Note (Addendum)
[place] Nurse Consult Note: Patient receiving care in [facility] ED018 Palliative care consult. Patient from [place].  Reason for Consult: Multiple stage 2-3 decubiti on buttocks and sacrum.  one looks possibly infected. Wound type: unstageable kissing pressure injuries just below the coccyx Pressure Injury POA: Yes Measurement: 1 cm x 1 cm bilaterally with fissure between Wound bed: 100% black Drainage (amount, consistency, odor) tan/brown Periwound: erythematous Dressing procedure/placement/frequency: Cleanse the sacral wound with NS. Pat dry then apply a nickel thick layer of MediHoney directly to the wound or onto a dressing. Make certain that the dressing covers the entire wound base, but not in contact with the peri-wound skin, then cover with dry gauze and secure with foam dressing. Change dressing daily or PRN soiling.  Protect bony prominences with foam dressings and turning q2hr.   Monitor the wound area(s) for worsening of condition such as: Signs/symptoms of infection, increase in size, development of or worsening of odor, development of pain, or increased pain at the affected locations.   Notify the medical team if any of these develop.  Thank you for the consult. [place] nurse will not follow at this time.   Please re-consult the [place] team if needed.  Cathlean Marseilles Tamala Julian, MSN, RN, [place], Lysle Pearl, [facility] Wound Treatment Associate Pager [phone]

## 2022-02-10 NOTE — ED Notes (Signed)
Pt extremely agitated, crying out, words unintelligible.

## 2022-02-10 NOTE — Assessment & Plan Note (Addendum)
Comfort measures

## 2022-02-10 NOTE — Progress Notes (Addendum)
PHARMACY - PHYSICIAN COMMUNICATION CRITICAL VALUE ALERT - BLOOD CULTURE IDENTIFICATION (BCID)  Tamara Fry is an 76 y.o. female who presented to [facility] on 02/09/2022 with a chief complaint of AMS and HHS.  Assessment:  1/3 bottles growing staph epi - likely contaminant  Name of physician (or Provider) Contacted: Dr. Corinna Capra  Current antibiotics: Vancomycin, Cefepime, Flagyl for cellulitis  Changes to prescribed antibiotics recommended:  No additional abx needed  Results for orders placed or performed during the hospital encounter of 02/09/22  Blood Culture ID Panel (Reflexed) (Collected: 02/09/2022 10:56 PM)  Result Value Ref Range   Enterococcus faecalis NOT DETECTED NOT DETECTED   Enterococcus Faecium NOT DETECTED NOT DETECTED   Listeria monocytogenes NOT DETECTED NOT DETECTED   Staphylococcus species DETECTED (A) NOT DETECTED   Staphylococcus aureus (BCID) NOT DETECTED NOT DETECTED   Staphylococcus epidermidis DETECTED (A) NOT DETECTED   Staphylococcus lugdunensis NOT DETECTED NOT DETECTED   Streptococcus species NOT DETECTED NOT DETECTED   Streptococcus agalactiae NOT DETECTED NOT DETECTED   Streptococcus pneumoniae NOT DETECTED NOT DETECTED   Streptococcus pyogenes NOT DETECTED NOT DETECTED   A.calcoaceticus-baumannii NOT DETECTED NOT DETECTED   Bacteroides fragilis NOT DETECTED NOT DETECTED   Enterobacterales NOT DETECTED NOT DETECTED   Enterobacter cloacae complex NOT DETECTED NOT DETECTED   Escherichia coli NOT DETECTED NOT DETECTED   Klebsiella aerogenes NOT DETECTED NOT DETECTED   Klebsiella oxytoca NOT DETECTED NOT DETECTED   Klebsiella pneumoniae NOT DETECTED NOT DETECTED   Proteus species NOT DETECTED NOT DETECTED   Salmonella species NOT DETECTED NOT DETECTED   Serratia marcescens NOT DETECTED NOT DETECTED   Haemophilus influenzae NOT DETECTED NOT DETECTED   Neisseria meningitidis NOT DETECTED NOT DETECTED   Pseudomonas aeruginosa NOT DETECTED NOT DETECTED    Stenotrophomonas maltophilia NOT DETECTED NOT DETECTED   Candida albicans NOT DETECTED NOT DETECTED   Candida auris NOT DETECTED NOT DETECTED   Candida glabrata NOT DETECTED NOT DETECTED   Candida krusei NOT DETECTED NOT DETECTED   Candida parapsilosis NOT DETECTED NOT DETECTED   Candida tropicalis NOT DETECTED NOT DETECTED   Cryptococcus neoformans/gattii NOT DETECTED NOT DETECTED   Methicillin resistance mecA/C DETECTED (A) NOT DETECTED    Sherlon Handing, PharmD, BCPS Please see amion for complete clinical pharmacist phone list 02/10/2022  9:17 PM

## 2022-02-10 NOTE — Hospital Course (Addendum)
Tamara Fry is Tamara Fry 76 y.o. female with medical history significant of advanced dementia, just started on hospice.  H/o CVA, T2DM, CKD IV, HTN, HLD, and multiple other medical issues here with hyperglycemia and worsening mental status.  She had continued decline.  Comfort measures at this point.  Plan for [facility].   See below for additional details

## 2022-02-10 NOTE — ED Notes (Signed)
Insulin gtt and D5LR infusion stopped at this time, per order to d/c IV insulin and fluids 2 hours after giving subcutaneous levemir.

## 2022-02-10 NOTE — ED Notes (Signed)
Pt continues to pull and IV lines, bedside monitor wires, and is not redirectable.

## 2022-02-10 NOTE — ED Notes (Signed)
Pt is disoriented and confused. Pt continues to pull on monitor wires, IV, and fluid tubing. Patient has been redirected multiple times with no success. Patient placed in hand mitts to prevent pulling off medical equipment

## 2022-02-10 NOTE — Assessment & Plan Note (Addendum)
AAOx0 at baseline it sounds like But she's able to speak (nonsensical typically), declined as noted above See acute encephalopathy above. Current state represents decline from her baseline

## 2022-02-10 NOTE — ED Notes (Signed)
Pt appears agitated and is crying out.

## 2022-02-10 NOTE — Progress Notes (Signed)
PROGRESS NOTE    Tamara Fry  JME:[id number] DOB: 14-Feb-1946 DOA: 02/09/2022 PCP: Jani Gravel, MD  Chief Complaint  Patient presents with   Altered Mental Status   Hyperglycemia    Brief Narrative:  Tamara Fry is Tamara Fry 76 y.o. female with medical history significant of advanced dementia, just started on hospice.  H/o CVA, T2DM, CKD IV, HTN, HLD, and multiple other medical issues here with hyperglycemia and worsening mental status.  See below for additional details    Assessment & Plan:   Principal Problem:   Hyperosmolar hyperglycemic state (HHS) ([place]) Active Problems:   Goals of care, counseling/discussion   Leukocytosis   Septic shock (HCC)   Lactic acidosis   Decubitus ulcer   Acute parotitis   Acute encephalopathy   AKI (acute kidney injury) ([place])   CKD (chronic kidney disease) stage 4, GFR 15-29 ml/min (HCC)   Hypernatremia   Essential hypertension   Persistent cognitive impairment   Failure to thrive in adult   Assessment and Plan: * Hyperosmolar hyperglycemic state (HHS) ([place]) Transition to basal/bolus regimen Follow   Goals of care, counseling/discussion Discussed with Corene Cornea and Eddie Dibbles.  Discussed her critically ill state.  Discussed code status, they were both in agreement with transition to DNR.    Will c/s palliative care.  She follows with [facility] outpatient for hospice services.  Septic shock (HCC) Leukocytosis, tachycardia, tachypnea, intermittent hypotension, lactic >9 and concern for parotitis vs infected decubitus ulcer.  Broad spectrum abx Aggressive IVF Blood cx pending UA not collected yet, no UOP (I&O in ED without urine) CXR without acute findings   Lactic acidosis Due to sepsis Follow with IVF  Decubitus ulcer Unstageable, suspect infected, foul smelling Follow CT abd/pelvis  Broad spectrum abx as above  Acute parotitis Question of acute parotitis of R parotid gland abx as noted above  Acute encephalopathy Related to  hyperglycemia, hypernatremia, and infection Head CT without acute abnormality With her change in ambulatory status over 1 week ago, discussed possibility of MRI with Corene Cornea, but doubt she could be still for this procedure and not sure how much it will change overall management -> will follow CT abd/pelvis first and hold off MRI. Delirium precautions  AKI (acute kidney injury) ([place]) Creatinine improved, but lack of UOP Follow bladder scans Follow CT abd/pelvis  I/O as needed, will trend with IVF  Hypernatremia Follow with IVF  CKD (chronic kidney disease) stage 4, GFR 15-29 ml/min (HCC) Creat of 1.8 actually looks to be her baseline.  Essential hypertension Hold BP meds  Persistent cognitive impairment AAOx0 at baseline it sounds like But she's able to speak (nonsensical typically), declined as noted above See acute encephalopathy above.  Failure to thrive in adult Only PO intake for several weeks water and boost SLP eval when safe, more alert NPO for now      DVT prophylaxis: lovenox Code Status: DNR Family Communication: Lieutenant Diego Disposition:   Status is: Inpatient Remains inpatient appropriate because: need for abx, additional goc conversations   Consultants:  palliative  Procedures:  none  Antimicrobials:  Anti-infectives (From admission, onward)    Start     Dose/Rate Route Frequency Ordered Stop   02/12/22 1000  vancomycin (VANCOCIN) IVPB 1000 mg/200 mL premix        1,000 mg 200 mL/hr over 60 Minutes Intravenous Every 48 hours 02/10/22 0920     02/10/22 1000  ceFEPIme (MAXIPIME) 2 g in sodium chloride 0.9 % 100 mL IVPB  2 g 200 mL/hr over 30 Minutes Intravenous Every 12 hours 02/10/22 0938     02/10/22 0930  vancomycin (VANCOCIN) IVPB 1000 mg/200 mL premix        1,000 mg 200 mL/hr over 60 Minutes Intravenous  Once 02/10/22 0916     02/10/22 0215  metroNIDAZOLE (FLAGYL) IVPB 500 mg        500 mg 100 mL/hr over 60 Minutes Intravenous Every  12 hours 02/10/22 0201     02/10/22 0115  cefTRIAXone (ROCEPHIN) 2 g in sodium chloride 0.9 % 100 mL IVPB  Status:  Discontinued        2 g 200 mL/hr over 30 Minutes Intravenous Every 24 hours 02/10/22 0105 02/10/22 0932   02/10/22 0100  ceFAZolin (ANCEF) IVPB 1 g/50 mL premix  Status:  Discontinued        1 g 100 mL/hr over 30 Minutes Intravenous  Once 02/10/22 0050 02/10/22 0105       Subjective: moaning  Objective: Vitals:   02/10/22 0715 02/10/22 0730 02/10/22 0902 02/10/22 0945  BP: 94/63 (!) 90/36 101/61 103/62  Pulse: 93 99 94 95  Resp: 20 (!) 29 (!) 26 (!) 27  Temp:      TempSrc:      SpO2: 97% 96% 94% 100%    Intake/Output Summary (Last 24 hours) at 02/10/2022 1014 Last data filed at 02/10/2022 0451 Gross per 24 hour  Intake 1752.95 ml  Output --  Net 1752.95 ml   There were no vitals filed for this visit.  Examination:  General exam: Appears agitated, frail Respiratory system: unlabored Cardiovascular system: RRR Gastrointestinal system: S/NT/ND Decubitus ulcers to sacrum noted, unstageable, foul smelling Central nervous system: disoriented, moving all extremities, moaning Extremities: no LEE, ?hematoma to LLE under dressing  Data Reviewed: I have personally reviewed following labs and imaging studies  CBC: Recent Labs  Lab 02/09/22 2304 02/09/22 2321  WBC 22.4*  --   NEUTROABS 19.3*  --   HGB 12.6 12.6  HCT 39.7 37.0  MCV 95.9  --   PLT 274  --     Basic Metabolic Panel: Recent Labs  Lab 02/09/22 2304 02/09/22 2321 02/10/22 0330 02/10/22 0651  NA 147* 146* 148* 150*  K 4.1 4.7 4.7 4.0  CL 110  --  111 113*  CO2 21*  --  24 25  GLUCOSE 611*  --  355* 198*  BUN 55*  --  49* 48*  CREATININE 1.85*  --  1.50* 1.28*  CALCIUM 10.1  --  9.6 9.7    GFR: CrCl cannot be calculated (Unknown ideal weight.).  Liver Function Tests: Recent Labs  Lab 02/09/22 2304  AST 27  ALT 18  ALKPHOS 116  BILITOT 0.9  PROT 6.2*  ALBUMIN 2.4*     CBG: Recent Labs  Lab 02/10/22 0447 02/10/22 0606 02/10/22 0727 02/10/22 0839 02/10/22 0947  GLUCAP 216* 175* 122* 145* 136*     Recent Results (from the past 240 hour(s))  Blood culture (routine x 2)     Status: None (Preliminary result)   Collection Time: 02/09/22 10:56 PM   Specimen: BLOOD RIGHT ARM  Result Value Ref Range Status   Specimen Description BLOOD RIGHT ARM  Final   Special Requests   Final    BOTTLES DRAWN AEROBIC AND ANAEROBIC Blood Culture results may not be optimal due to an inadequate volume of blood received in culture bottles   Culture   Final    NO GROWTH <  12 HOURS Performed at [facility], [place] [address], [place], [place] [address]    Report Status PENDING  Incomplete  Blood culture (routine x 2)     Status: None (Preliminary result)   Collection Time: 02/09/22 11:15 PM   Specimen: BLOOD LEFT ARM  Result Value Ref Range Status   Specimen Description BLOOD LEFT ARM  Final   Special Requests   Final    BOTTLES DRAWN AEROBIC ONLY Blood Culture adequate volume   Culture   Final    NO GROWTH < 12 HOURS Performed at [facility], [place] [address] [place], [place] [address]    Report Status PENDING  Incomplete  MRSA Next Gen by PCR, Nasal     Status: None   Collection Time: 02/10/22  3:40 AM   Specimen: Nasal Mucosa; Nasal Swab  Result Value Ref Range Status   MRSA by PCR Next Gen NOT DETECTED NOT DETECTED Final    Comment: (NOTE) The GeneXpert MRSA Assay (FDA approved for NASAL specimens only), is one component of [facility] comprehensive MRSA colonization surveillance program. It is not intended to diagnose MRSA infection nor to guide or monitor treatment for MRSA infections. Test performance is not FDA approved in patients less than 55 years old. Performed at [facility], [place] [address] [place], [place] [address]          Radiology Studies: CT Maxillofacial Wo Contrast  Result Date: 02/10/2022 CLINICAL DATA:  Initial  evaluation for right mandibular swelling. EXAM: CT MAXILLOFACIAL WITHOUT CONTRAST TECHNIQUE: Multidetector CT imaging of the maxillofacial structures was performed. Multiplanar CT image reconstructions were also generated. RADIATION DOSE REDUCTION: This exam was performed according to the departmental dose-optimization program which includes automated exposure control, adjustment of the mA and/or kV according to patient size and/or use of iterative reconstruction technique. COMPARISON:  None Available. FINDINGS: Osseous: No acute osseous abnormality. No discrete or worrisome osseous lesions. Orbits: Globes and orbital soft tissues within normal limits. Prior bilateral ocular lens replacement. Sinuses: Paranasal sinuses are largely clear. Mastoid air cells and middle ear cavities are well pneumatized and free of fluid. Soft tissues: Asymmetric enlargement with heterogeneous appearance of the right parotid gland, suspicious for acute parotitis. No obstructive stone seen within Stensen's duct. No discrete abscess or drainable fluid collection. Associated stranding noted within the overlying right parotid space, extending to partially involve the right submandibular and parapharyngeal spaces. Right submandibular gland itself within normal limits. Mild asymmetric prominence of subcentimeter upper right cervical lymph nodes, likely reactive. Limited intracranial: Age-related cerebral atrophy with chronic small vessel ischemic disease. Otherwise unremarkable. IMPRESSION: Asymmetric enlargement with heterogeneous appearance of the right parotid gland, suspicious for acute parotitis. No obstructive stone. No discrete abscess or drainable fluid collection. Electronically Signed   By: Jeannine Boga M.D.   On: 02/10/2022 00:46   CT HEAD WO CONTRAST (5MM)  Result Date: 02/10/2022 CLINICAL DATA:  Delirium. EXAM: CT HEAD WITHOUT CONTRAST TECHNIQUE: Contiguous axial images were obtained from the base of the skull through  the vertex without intravenous contrast. RADIATION DOSE REDUCTION: This exam was performed according to the departmental dose-optimization program which includes automated exposure control, adjustment of the mA and/or kV according to patient size and/or use of iterative reconstruction technique. COMPARISON:  Head CT 06/16/2014 FINDINGS: Brain: Progressive atrophy from 2015. Progressive periventricular chronic small vessel ischemic change. No hemorrhage or evidence of acute ischemia. No subdural or extra-axial collection. No midline shift, mass lesion or mass effect. Bifrontal atrophy  but no extra-axial collection. Enlarged partially empty sella. Vascular: Atherosclerosis of skullbase vasculature without hyperdense vessel or abnormal calcification. Skull: No fracture or focal lesion. Incidental frontal hyperostosis. Sinuses/Orbits: Paranasal sinuses and mastoid air cells are clear. The visualized orbits are unremarkable. Other: None. IMPRESSION: 1. No acute intracranial abnormality. 2. Progressive atrophy from 2015. Progressive chronic small vessel ischemic change. Electronically Signed   By: Keith Rake M.D.   On: 02/10/2022 00:17   DG Chest 2 View  Result Date: 02/09/2022 CLINICAL DATA:  Altered mental status. EXAM: CHEST - 2 VIEW COMPARISON:  12/10/2015 FINDINGS: Mild patient rotation with anti lordotic positioning, difficulty positioning due to medical condition.The cardiomediastinal contours are normal. Skin folds project over both hemi thoraces. Pulmonary vasculature is normal. No consolidation, pleural effusion, or pneumothorax. No acute osseous abnormalities are seen. IMPRESSION: No acute chest findings. Electronically Signed   By: Keith Rake M.D.   On: 02/09/2022 22:52        Scheduled Meds:  enoxaparin (LOVENOX) injection  30 mg Subcutaneous Q24H   insulin aspart  1-3 Units Subcutaneous Q4H   insulin detemir  5 Units Subcutaneous Q12H   Continuous Infusions:  ceFEPime (MAXIPIME)  IV     dextrose 5% lactated ringers 150 mL/hr at 02/10/22 0552   insulin 1.4 Units/hr (02/10/22 0948)   lactated ringers Stopped (02/10/22 0451)   metronidazole Stopped (02/10/22 0341)   vancomycin     [START ON 02/12/2022] vancomycin       LOS: 0 days    Time spent: over 30 min Critically ill with 40 min critical care time.  Severe lactic acidosis, septic shock.   Fayrene Helper, MD [facility]   To contact the attending provider between 7A-7P or the covering provider during after hours 7P-7A, please log into the web site [email or web address] and access using universal [place] password for that web site. If you do not have the password, please call the hospital operator.  02/10/2022, 10:14 AM

## 2022-02-10 NOTE — Progress Notes (Addendum)
Pharmacy Antibiotic Note  Tamara Fry is a 76 y.o. female admitted on 02/09/2022 with cellulitis.  Pharmacy has been consulted for vancomycin and cefepime dosing. Pt is afebrile but WBC is elevated. Lactic is elevated and Scr is trending down.   Plan: Vancomycin 1gm IV Q48H Cefepime 2gm IV Q12H F/u renal fxn, C&S, clinical status and peak/trough at SS    Temp (24hrs), Avg:98.7 F (37.1 C), Min:98.3 F (36.8 C), Max:99 F (37.2 C)  Recent Labs  Lab 02/09/22 2304 02/10/22 0052 02/10/22 0330 02/10/22 0609 02/10/22 0651  WBC 22.4*  --   --   --   --   CREATININE 1.85*  --  1.50*  --  1.28*  LATICACIDVEN 7.4* 7.3* 5.7* >9.0*  --     CrCl cannot be calculated (Unknown ideal weight.).    No Known Allergies  Antimicrobials this admission: Vanc 6/28>> Cefepime 6/28>> CTX x1 6/28 Flagyl 6/28>>  Dose adjustments this admission: N/A  Microbiology results: 6/27 Blood - NGTD 6/27 MRSA - NEG  Thank you for allowing pharmacy to be a part of this patient's care.  Kaylana Fenstermacher, Rande Lawman 02/10/2022 9:20 AM

## 2022-02-11 DIAGNOSIS — K1121 Acute sialoadenitis: Secondary | ICD-10-CM | POA: Diagnosis not present

## 2022-02-11 DIAGNOSIS — Z7189 Other specified counseling: Secondary | ICD-10-CM

## 2022-02-11 DIAGNOSIS — R6521 Severe sepsis with septic shock: Secondary | ICD-10-CM

## 2022-02-11 DIAGNOSIS — R627 Adult failure to thrive: Secondary | ICD-10-CM

## 2022-02-11 DIAGNOSIS — G9340 Encephalopathy, unspecified: Secondary | ICD-10-CM | POA: Diagnosis not present

## 2022-02-11 DIAGNOSIS — L89303 Pressure ulcer of unspecified buttock, stage 3: Secondary | ICD-10-CM

## 2022-02-11 DIAGNOSIS — Z66 Do not resuscitate: Secondary | ICD-10-CM

## 2022-02-11 DIAGNOSIS — N179 Acute kidney failure, unspecified: Secondary | ICD-10-CM | POA: Diagnosis not present

## 2022-02-11 DIAGNOSIS — R4189 Other symptoms and signs involving cognitive functions and awareness: Secondary | ICD-10-CM

## 2022-02-11 DIAGNOSIS — A419 Sepsis, unspecified organism: Principal | ICD-10-CM

## 2022-02-11 DIAGNOSIS — Z515 Encounter for palliative care: Secondary | ICD-10-CM

## 2022-02-11 DIAGNOSIS — Z789 Other specified health status: Secondary | ICD-10-CM

## 2022-02-11 DIAGNOSIS — F03911 Unspecified dementia, unspecified severity, with agitation: Secondary | ICD-10-CM

## 2022-02-11 DIAGNOSIS — E1100 Type 2 diabetes mellitus with hyperosmolarity without nonketotic hyperglycemic-hyperosmolar coma (NKHHC): Secondary | ICD-10-CM | POA: Diagnosis not present

## 2022-02-11 DIAGNOSIS — N184 Chronic kidney disease, stage 4 (severe): Secondary | ICD-10-CM

## 2022-02-11 LAB — CBC
HCT: 35.7 % — ABNORMAL LOW (ref 36.0–46.0)
Hemoglobin: 10.6 g/dL — ABNORMAL LOW (ref 12.0–15.0)
MCH: 29.7 pg (ref 26.0–34.0)
MCHC: 29.7 g/dL — ABNORMAL LOW (ref 30.0–36.0)
MCV: 100 fL (ref 80.0–100.0)
Platelets: 192 10*3/uL (ref 150–400)
RBC: 3.57 MIL/uL — ABNORMAL LOW (ref 3.87–5.11)
RDW: 13.5 % (ref 11.5–15.5)
WBC: 20.2 10*3/uL — ABNORMAL HIGH (ref 4.0–10.5)
nRBC: 0 % (ref 0.0–0.2)

## 2022-02-11 LAB — LACTIC ACID, PLASMA
Lactic Acid, Venous: 5.7 mmol/L (ref 0.5–1.9)
Lactic Acid, Venous: 2.8 mmol/L (ref 0.5–1.9)

## 2022-02-11 LAB — BASIC METABOLIC PANEL
Anion gap: 11 (ref 5–15)
BUN: 37 mg/dL — ABNORMAL HIGH (ref 8–23)
CO2: 23 mmol/L (ref 22–32)
Calcium: 9.1 mg/dL (ref 8.9–10.3)
Chloride: 114 mmol/L — ABNORMAL HIGH (ref 98–111)
Creatinine, Ser: 0.99 mg/dL (ref 0.44–1.00)
GFR, Estimated: 59 mL/min — ABNORMAL LOW (ref >60)
Glucose, Bld: 167 mg/dL — ABNORMAL HIGH (ref 70–99)
Potassium: 4.2 mmol/L (ref 3.5–5.1)
Sodium: 148 mmol/L — ABNORMAL HIGH (ref 135–145)

## 2022-02-11 LAB — GLUCOSE, CAPILLARY
Glucose-Capillary: 138 mg/dL — ABNORMAL HIGH (ref 70–99)
Glucose-Capillary: 97 mg/dL (ref 70–99)
Glucose-Capillary: 60 mg/dL — ABNORMAL LOW (ref 70–99)
Glucose-Capillary: 81 mg/dL (ref 70–99)
Glucose-Capillary: 51 mg/dL — ABNORMAL LOW (ref 70–99)

## 2022-02-11 LAB — HEMOGLOBIN A1C
Hgb A1c MFr Bld: 11.3 % — ABNORMAL HIGH (ref 4.8–5.6)
Mean Plasma Glucose: 278 mg/dL

## 2022-02-11 MED ORDER — GLYCOPYRROLATE 1 MG PO TABS: 1.0000 mg | ORAL_TABLET | ORAL | Status: DC | PRN

## 2022-02-11 MED ORDER — ONDANSETRON HCL 4 MG/2ML IJ SOLN: 4.0000 mg | Freq: Four times a day (QID) | INTRAMUSCULAR | Status: DC | PRN

## 2022-02-11 MED ORDER — DIPHENHYDRAMINE HCL 50 MG/ML IJ SOLN: 12.5000 mg | INTRAMUSCULAR | Status: DC | PRN

## 2022-02-11 MED ORDER — GLYCOPYRROLATE 0.2 MG/ML IJ SOLN: 0.2000 mg | INTRAMUSCULAR | Status: DC | PRN

## 2022-02-11 MED ORDER — HALOPERIDOL LACTATE 5 MG/ML IJ SOLN
2.0000 mg | Freq: Four times a day (QID) | INTRAMUSCULAR | Status: DC | PRN
  Administered 2022-02-13: 2 mg via INTRAVENOUS
  Filled 2022-02-11: qty 1

## 2022-02-11 MED ORDER — HALOPERIDOL LACTATE 2 MG/ML PO CONC: 2.0000 mg | Freq: Four times a day (QID) | ORAL | Status: DC | PRN

## 2022-02-11 MED ORDER — POLYVINYL ALCOHOL 1.4 % OP SOLN: 1.0000 [drp] | Freq: Four times a day (QID) | OPHTHALMIC | Status: DC | PRN

## 2022-02-11 MED ORDER — LORAZEPAM 1 MG PO TABS: 1.0000 mg | ORAL_TABLET | ORAL | Status: DC | PRN

## 2022-02-11 MED ORDER — SODIUM CHLORIDE 0.9 % IV BOLUS
1000.0000 mL | Freq: Once | INTRAVENOUS | Status: AC
  Administered 2022-02-11: 1000 mL via INTRAVENOUS

## 2022-02-11 MED ORDER — LACTATED RINGERS IV BOLUS
500.0000 mL | Freq: Once | INTRAVENOUS | Status: AC
  Administered 2022-02-11: 500 mL via INTRAVENOUS

## 2022-02-11 MED ORDER — ACETAMINOPHEN 650 MG RE SUPP: 650.0000 mg | Freq: Four times a day (QID) | RECTAL | Status: DC | PRN

## 2022-02-11 MED ORDER — VANCOMYCIN HCL 1250 MG/250ML IV SOLN
1250.0000 mg | INTRAVENOUS | Status: DC
Start: 2022-02-12 — End: 2022-02-11

## 2022-02-11 MED ORDER — BIOTENE DRY MOUTH MT LIQD
15.0000 mL | Freq: Two times a day (BID) | OROMUCOSAL | Status: DC
  Administered 2022-02-12 – 2022-02-13 (×4): 15 mL via TOPICAL

## 2022-02-11 MED ORDER — ACETAMINOPHEN 325 MG PO TABS: 650.0000 mg | ORAL_TABLET | Freq: Four times a day (QID) | ORAL | Status: DC | PRN

## 2022-02-11 MED ORDER — ONDANSETRON 4 MG PO TBDP: 4.0000 mg | ORAL_TABLET | Freq: Four times a day (QID) | ORAL | Status: DC | PRN

## 2022-02-11 MED ORDER — HALOPERIDOL 1 MG PO TABS: 2.0000 mg | ORAL_TABLET | Freq: Four times a day (QID) | ORAL | Status: DC | PRN

## 2022-02-11 MED ORDER — LORAZEPAM 2 MG/ML IJ SOLN
1.0000 mg | INTRAMUSCULAR | Status: DC | PRN
  Administered 2022-02-11: 1 mg via INTRAVENOUS
  Filled 2022-02-11: qty 1

## 2022-02-11 MED ORDER — MORPHINE SULFATE (PF) 2 MG/ML IV SOLN
2.0000 mg | INTRAVENOUS | Status: DC | PRN
  Administered 2022-02-11 – 2022-02-12 (×6): 2 mg via INTRAVENOUS
  Filled 2022-02-11 (×6): qty 1

## 2022-02-11 MED ORDER — INSULIN ASPART 100 UNIT/ML IJ SOLN: 0.0000 [IU] | INTRAMUSCULAR | Status: DC

## 2022-02-11 MED ORDER — GLYCOPYRROLATE 0.2 MG/ML IJ SOLN
0.2000 mg | INTRAMUSCULAR | Status: DC | PRN
  Administered 2022-02-12: 0.2 mg via INTRAVENOUS
  Filled 2022-02-11: qty 1

## 2022-02-11 MED ORDER — LORAZEPAM 2 MG/ML PO CONC: 1.0000 mg | ORAL | Status: DC | PRN

## 2022-02-11 NOTE — Progress Notes (Signed)
  Transition of Care [facility]) Screening Note   Patient Details  Name: Tamara Fry Date of Birth: Oct 28, 1945   Transition of [facility]) CM/SW Contact:    Cyndi Bender, RN Phone Number: 02/11/2022, 8:29 AM    Transition of Care Department [facility]) has reviewed patient and no TOC needs have been identified at this time. We will continue to monitor patient advancement through interdisciplinary progression rounds. If new patient transition needs arise, please place a TOC consult.

## 2022-02-11 NOTE — Progress Notes (Signed)
PHARMACY - PHYSICIAN COMMUNICATION CRITICAL VALUE ALERT - BLOOD CULTURE IDENTIFICATION (BCID)  Tamara Fry is an 76 y.o. female who presented to [facility] on 02/09/2022 with a chief complaint of AMS and HHS.  Assessment:  Blood cultures show GPC/clusters in 2/4 bottles. Previous BCID showed staph epi, MEC A positive  Name of physician (or Provider) Contacted: Dr. Florene Glen  Current antibiotics: Vancomycin, Cefepime, Flagyl for cellulitis  Changes to prescribed antibiotics recommended:  -patient moving to comfort care  Results for orders placed or performed during the hospital encounter of 02/09/22  Blood Culture ID Panel (Reflexed) (Collected: 02/09/2022 10:56 PM)  Result Value Ref Range   Enterococcus faecalis NOT DETECTED NOT DETECTED   Enterococcus Faecium NOT DETECTED NOT DETECTED   Listeria monocytogenes NOT DETECTED NOT DETECTED   Staphylococcus species DETECTED (A) NOT DETECTED   Staphylococcus aureus (BCID) NOT DETECTED NOT DETECTED   Staphylococcus epidermidis DETECTED (A) NOT DETECTED   Staphylococcus lugdunensis NOT DETECTED NOT DETECTED   Streptococcus species NOT DETECTED NOT DETECTED   Streptococcus agalactiae NOT DETECTED NOT DETECTED   Streptococcus pneumoniae NOT DETECTED NOT DETECTED   Streptococcus pyogenes NOT DETECTED NOT DETECTED   A.calcoaceticus-baumannii NOT DETECTED NOT DETECTED   Bacteroides fragilis NOT DETECTED NOT DETECTED   Enterobacterales NOT DETECTED NOT DETECTED   Enterobacter cloacae complex NOT DETECTED NOT DETECTED   Escherichia coli NOT DETECTED NOT DETECTED   Klebsiella aerogenes NOT DETECTED NOT DETECTED   Klebsiella oxytoca NOT DETECTED NOT DETECTED   Klebsiella pneumoniae NOT DETECTED NOT DETECTED   Proteus species NOT DETECTED NOT DETECTED   Salmonella species NOT DETECTED NOT DETECTED   Serratia marcescens NOT DETECTED NOT DETECTED   Haemophilus influenzae NOT DETECTED NOT DETECTED   Neisseria meningitidis NOT DETECTED NOT DETECTED    Pseudomonas aeruginosa NOT DETECTED NOT DETECTED   Stenotrophomonas maltophilia NOT DETECTED NOT DETECTED   Candida albicans NOT DETECTED NOT DETECTED   Candida auris NOT DETECTED NOT DETECTED   Candida glabrata NOT DETECTED NOT DETECTED   Candida krusei NOT DETECTED NOT DETECTED   Candida parapsilosis NOT DETECTED NOT DETECTED   Candida tropicalis NOT DETECTED NOT DETECTED   Cryptococcus neoformans/gattii NOT DETECTED NOT DETECTED   Methicillin resistance mecA/C DETECTED (A) NOT DETECTED   Hildred Laser, PharmD Clinical Pharmacist **Pharmacist phone directory can now be found on [email or web address] ([email or web address]).  Listed under [place].

## 2022-02-11 NOTE — Plan of Care (Signed)

## 2022-02-11 NOTE — Progress Notes (Signed)
PROGRESS NOTE    Tamara Fry  HBZ:[id number] DOB: 04-Jun-1946 DOA: 02/09/2022 PCP: Janie Morning, DO  Chief Complaint  Patient presents with   Altered Mental Status   Hyperglycemia    Brief Narrative:  Tamara Fry is Tamara Fry 76 y.o. female with medical history significant of advanced dementia, just started on hospice.  H/o CVA, T2DM, CKD IV, HTN, HLD, and multiple other medical issues here with hyperglycemia and worsening mental status.  See below for additional details    Assessment & Plan:   Principal Problem:   Hyperosmolar hyperglycemic state (HHS) ([place]) Active Problems:   Goals of care, counseling/discussion   Leukocytosis   Septic shock (HCC)   Lactic acidosis   Decubitus ulcer   Acute parotitis   Acute encephalopathy   AKI (acute kidney injury) ([place])   CKD (chronic kidney disease) stage 4, GFR 15-29 ml/min (HCC)   Hypernatremia   Essential hypertension   Persistent cognitive impairment   Failure to thrive in adult   Assessment and Plan: * Hyperosmolar hyperglycemic state (HHS) ([place]) Now with hypoglycemia Stop basal, q4 SSI given NPO  Goals of care, counseling/discussion Transitioned to DNR 6/28.    Will c/s palliative care.  She follows with [facility] outpatient for hospice services.  Septic shock (HCC) Leukocytosis, tachycardia, tachypnea, intermittent hypotension, lactic >9 and concern for parotitis vs infected decubitus ulcer (UTI and pneumonia other potential sources with UA and small R effusion).  Broad spectrum abx Aggressive IVF 1/2 6/27 with staph epidermidis -> suspect contaminant, follow repeat cultures UA collected after abx, concerning for UTI -> follow culture CXR without acute findings   Lactic acidosis Due to sepsis Follow with IVF Gradually downtrending  Decubitus ulcer Unstageable, suspect infected, foul smelling CT abd/pelvis without clear explanation for leukocytosis -> no well defined decubitus ulcer seen Broad spectrum abx as  above  Acute parotitis Question of acute parotitis of R parotid gland abx as noted above  Acute encephalopathy Related to hyperglycemia, hypernatremia, and infection Head CT without acute abnormality With her change in ambulatory status over 1 week ago, discussed possibility of MRI with Corene Cornea, but doubt she could be still for this procedure and not sure how much it will change overall management -> will follow CT abd/pelvis first and hold off MRI. Delirium precautions  AKI (acute kidney injury) ([place]) Creatinine improved Follow bladder scans CT without hydro   Hypernatremia Follow with IVF  CKD (chronic kidney disease) stage 4, GFR 15-29 ml/min (HCC) Creat of 1.8 actually looks to be her baseline.  Essential hypertension Hold BP meds  Persistent cognitive impairment AAOx0 at baseline it sounds like But she's able to speak (nonsensical typically), declined as noted above See acute encephalopathy above. Current state represents decline from her baseline  Failure to thrive in adult Only PO intake for several weeks water and boost SLP eval when safe, more alert NPO for now      DVT prophylaxis: lovenox Code Status: DNR Family Communication: paul Disposition:   Status is: Inpatient Remains inpatient appropriate because: need for abx, additional goc conversations   Consultants:  palliative  Procedures:  none  Antimicrobials:  Anti-infectives (From admission, onward)    Start     Dose/Rate Route Frequency Ordered Stop   02/12/22 1000  vancomycin (VANCOCIN) IVPB 1000 mg/200 mL premix  Status:  Discontinued        1,000 mg 200 mL/hr over 60 Minutes Intravenous Every 48 hours 02/10/22 0920 02/11/22 0937   02/12/22 1000  vancomycin (VANCOREADY)  IVPB 1250 mg/250 mL        1,250 mg 166.7 mL/hr over 90 Minutes Intravenous Every 48 hours 02/11/22 0937     02/10/22 1000  ceFEPIme (MAXIPIME) 2 g in sodium chloride 0.9 % 100 mL IVPB        2 g 200 mL/hr over 30 Minutes  Intravenous Every 12 hours 02/10/22 0938     02/10/22 0930  vancomycin (VANCOCIN) IVPB 1000 mg/200 mL premix        1,000 mg 200 mL/hr over 60 Minutes Intravenous  Once 02/10/22 0916 02/10/22 1255   02/10/22 0215  metroNIDAZOLE (FLAGYL) IVPB 500 mg        500 mg 100 mL/hr over 60 Minutes Intravenous Every 12 hours 02/10/22 0201     02/10/22 0115  cefTRIAXone (ROCEPHIN) 2 g in sodium chloride 0.9 % 100 mL IVPB  Status:  Discontinued        2 g 200 mL/hr over 30 Minutes Intravenous Every 24 hours 02/10/22 0105 02/10/22 0932   02/10/22 0100  ceFAZolin (ANCEF) IVPB 1 g/50 mL premix  Status:  Discontinued        1 g 100 mL/hr over 30 Minutes Intravenous  Once 02/10/22 0050 02/10/22 0105       Subjective: Moaning   Objective: Vitals:   02/11/22 0040 02/11/22 0350 02/11/22 0816 02/11/22 1205  BP: (!) 88/58 96/65 (!) 91/46 90/78  Pulse: 81 82 80 78  Resp: 17 20 20 17   Temp:  98 F (36.7 C) 97.8 F (36.6 C) 97.6 F (36.4 C)  TempSrc:  Axillary Axillary Axillary  SpO2: 96% 99% 90% 92%  Weight:        Intake/Output Summary (Last 24 hours) at 02/11/2022 1353 Last data filed at 02/11/2022 0500 Gross per 24 hour  Intake --  Output 400 ml  Net -400 ml   Filed Weights   02/10/22 1702  Weight: 51.9 kg    Examination:  General: moaning, delirious Cardiovascular: RRR Lungs: unlabored Abdomen: Soft, nontender, nondistended  Neurological: looks at me when I speak today, otherwise, nonintelligible speech, moaning, moving all extremities. Skin: DTI to L calf - decub deferred today Extremities: palpable DP pulses, no LEE   Data Reviewed: I have personally reviewed following labs and imaging studies  CBC: Recent Labs  Lab 02/09/22 2304 02/09/22 2321 02/11/22 0139  WBC 22.4*  --  20.2*  NEUTROABS 19.3*  --   --   HGB 12.6 12.6 10.6*  HCT 39.7 37.0 35.7*  MCV 95.9  --  100.0  PLT 274  --  628    Basic Metabolic Panel: Recent Labs  Lab 02/10/22 0330 02/10/22 0651  02/10/22 1550 02/10/22 1944 02/11/22 0139  NA 148* 150* 146* 144 148*  K 4.7 4.0 4.4 4.5 4.2  CL 111 113* 112* 114* 114*  CO2 24 25 25  18* 23  GLUCOSE 355* 198* 180* 183* 167*  BUN 49* 48* 41* 33* 37*  CREATININE 1.50* 1.28* 1.17* 0.76 0.99  CALCIUM 9.6 9.7 9.3 8.4* 9.1    GFR: CrCl cannot be calculated (Unknown ideal weight.).  Liver Function Tests: Recent Labs  Lab 02/09/22 2304  AST 27  ALT 18  ALKPHOS 116  BILITOT 0.9  PROT 6.2*  ALBUMIN 2.4*    CBG: Recent Labs  Lab 02/10/22 2126 02/11/22 0111 02/11/22 0531 02/11/22 0819 02/11/22 1151  GLUCAP 193* 138* 97 60* 81     Recent Results (from the past 240 hour(s))  Blood culture (routine x 2)  Status: Abnormal (Preliminary result)   Collection Time: 02/09/22 10:56 PM   Specimen: BLOOD RIGHT ARM  Result Value Ref Range Status   Specimen Description BLOOD RIGHT ARM  Final   Special Requests   Final    BOTTLES DRAWN AEROBIC AND ANAEROBIC Blood Culture results may not be optimal due to an inadequate volume of blood received in culture bottles   Culture  Setup Time   Final    GRAM POSITIVE COCCI IN CLUSTERS ANAEROBIC BOTTLE ONLY CRITICAL RESULT CALLED TO, READ BACK BY AND VERIFIED WITH: PHARMD K. AMEND [phone] @2114  FH Performed at [facility], [place] [address], [place], [place] [address]    Culture STAPHYLOCOCCUS EPIDERMIDIS (Abagale Boulos)  Final   Report Status PENDING  Incomplete  Blood Culture ID Panel (Reflexed)     Status: Abnormal   Collection Time: 02/09/22 10:56 PM  Result Value Ref Range Status   Enterococcus faecalis NOT DETECTED NOT DETECTED Final   Enterococcus Faecium NOT DETECTED NOT DETECTED Final   Listeria monocytogenes NOT DETECTED NOT DETECTED Final   Staphylococcus species DETECTED (Marcine Gadway) NOT DETECTED Final    Comment: CRITICAL RESULT CALLED TO, READ BACK BY AND VERIFIED WITH: PHARMD K. AMEND [id number] @2114  FH    Staphylococcus aureus (BCID) NOT DETECTED NOT DETECTED Final   Staphylococcus  epidermidis DETECTED (Lyberti Thrush) NOT DETECTED Final    Comment: Methicillin (oxacillin) resistant coagulase negative staphylococcus. Possible blood culture contaminant (unless isolated from more than one blood culture draw or clinical case suggests pathogenicity). No antibiotic treatment is indicated for blood  culture contaminants. CRITICAL RESULT CALLED TO, READ BACK BY AND VERIFIED WITH: PHARMD K. AMEND [phone] @2114  FH    Staphylococcus lugdunensis NOT DETECTED NOT DETECTED Final   Streptococcus species NOT DETECTED NOT DETECTED Final   Streptococcus agalactiae NOT DETECTED NOT DETECTED Final   Streptococcus pneumoniae NOT DETECTED NOT DETECTED Final   Streptococcus pyogenes NOT DETECTED NOT DETECTED Final   Aveline Daus.calcoaceticus-baumannii NOT DETECTED NOT DETECTED Final   Bacteroides fragilis NOT DETECTED NOT DETECTED Final   Enterobacterales NOT DETECTED NOT DETECTED Final   Enterobacter cloacae complex NOT DETECTED NOT DETECTED Final   Escherichia coli NOT DETECTED NOT DETECTED Final   Klebsiella aerogenes NOT DETECTED NOT DETECTED Final   Klebsiella oxytoca NOT DETECTED NOT DETECTED Final   Klebsiella pneumoniae NOT DETECTED NOT DETECTED Final   Proteus species NOT DETECTED NOT DETECTED Final   Salmonella species NOT DETECTED NOT DETECTED Final   Serratia marcescens NOT DETECTED NOT DETECTED Final   Haemophilus influenzae NOT DETECTED NOT DETECTED Final   Neisseria meningitidis NOT DETECTED NOT DETECTED Final   Pseudomonas aeruginosa NOT DETECTED NOT DETECTED Final   Stenotrophomonas maltophilia NOT DETECTED NOT DETECTED Final   Candida albicans NOT DETECTED NOT DETECTED Final   Candida auris NOT DETECTED NOT DETECTED Final   Candida glabrata NOT DETECTED NOT DETECTED Final   Candida krusei NOT DETECTED NOT DETECTED Final   Candida parapsilosis NOT DETECTED NOT DETECTED Final   Candida tropicalis NOT DETECTED NOT DETECTED Final   Cryptococcus neoformans/gattii NOT DETECTED NOT DETECTED Final    Methicillin resistance mecA/C DETECTED (Saraih Lorton) NOT DETECTED Final    Comment: CRITICAL RESULT CALLED TO, READ BACK BY AND VERIFIED WITH: PHARMD K. AMEND [phone] @2114  FH Performed at [facility], [place] [address], [place], [place] [address]   Blood culture (routine x 2)     Status: None (Preliminary result)   Collection Time: 02/09/22 11:15 PM   Specimen: BLOOD LEFT  ARM  Result Value Ref Range Status   Specimen Description BLOOD LEFT ARM  Final   Special Requests   Final    BOTTLES DRAWN AEROBIC ONLY Blood Culture adequate volume   Culture   Final    NO GROWTH 2 DAYS Performed at [facility], [address] [place], [place] [address]    Report Status PENDING  Incomplete  MRSA Next Gen by PCR, Nasal     Status: None   Collection Time: 02/10/22  3:40 AM   Specimen: Nasal Mucosa; Nasal Swab  Result Value Ref Range Status   MRSA by PCR Next Gen NOT DETECTED NOT DETECTED Final    Comment: (NOTE) The GeneXpert MRSA Assay (FDA approved for NASAL specimens only), is one component of [facility]ogg[facility] comprehensive MRSA colonization surveillance program. It is not intended to diagnose MRSA infection nor to guide or monitor treatment for MRSA infections. Test performance is not FDA approved in patients less than 61 years old. Performed at [facility], [place] [address] [place], [place] [address]   Urine Culture     Status: None (Preliminary result)   Collection Time: 02/10/22 12:30 PM   Specimen: In/Out Cath Urine  Result Value Ref Range Status   Specimen Description IN/OUT CATH URINE  Final   Special Requests NONE  Final   Culture   Final    CULTURE REINCUBATED FOR BETTER GROWTH Performed at [facility], [place] [address], [place], [place] [address]    Report Status PENDING  Incomplete         Radiology Studies: CT ABDOMEN PELVIS W CONTRAST  Result Date: 02/10/2022 CLINICAL DATA:  Hyperglycemia. Leukocytosis. Septic shock. Decubitus ulcer. EXAM: CT ABDOMEN AND PELVIS  WITH CONTRAST TECHNIQUE: Multidetector CT imaging of the abdomen and pelvis was performed using the standard protocol following bolus administration of intravenous contrast. RADIATION DOSE REDUCTION: This exam was performed according to the departmental dose-optimization program which includes automated exposure control, adjustment of the mA and/or kV according to patient size and/or use of iterative reconstruction technique. CONTRAST:  175mL OMNIPAQUE IOHEXOL 350 MG/ML SOLN COMPARISON:  02/08/2013 from [facility] FINDINGS: Lower chest: Multifactorial degradation, including mild motion and patient arm position, not raised above the head. Clear lung bases. Normal heart size with tiny right pleural effusion. Hepatobiliary: Vague hyperattenuation or hyperenhancement within the high right hepatic lobe on 15/3 is favored to represent altered perfusion. Cholecystectomy. New common duct dilatation including at 1.4 cm on 29/3. No intrahepatic duct dilatation or obstructive stone/mass. Pancreas: Normal, without mass or ductal dilatation. Spleen: Normal in size, without focal abnormality. Adrenals/Urinary Tract: Normal right adrenal gland. Mild left adrenal thickening and nodularity are new. Mild renal cortical thinning bilaterally. Interpolar right renal too small to characterize lesion. No hydronephrosis. Air within the urinary bladder. Stomach/Bowel: Normal stomach, without wall thickening. Normal colon and terminal ileum. Normal small bowel. Vascular/Lymphatic: Aortic atherosclerosis. No abdominopelvic adenopathy. Reproductive: Hysterectomy.  No adnexal mass. Other: No significant free fluid. Moderate pelvic floor laxity. No free intraperitoneal air. Musculoskeletal: No well-defined decubitus ulcer. There is mild subcutaneous edema superficial the left proximal femur on 74/3. Moderate left and mild right hip osteoarthritis. IMPRESSION: 1. Motion and patient position degradation. 2. No explanation for leukocytosis.  3. Air within the urinary bladder.  Correlate with instrumentation. 4. Small right pleural effusion. 5.  Aortic Atherosclerosis (ICD10-I70.0). 6. Cholecystectomy with new common duct dilatation. No obstructive stone or mass identified. Consider correlation with bilirubin levels. Electronically Signed  By: Abigail Miyamoto M.D.   On: 02/10/2022 10:41   CT Maxillofacial Wo Contrast  Result Date: 02/10/2022 CLINICAL DATA:  Initial evaluation for right mandibular swelling. EXAM: CT MAXILLOFACIAL WITHOUT CONTRAST TECHNIQUE: Multidetector CT imaging of the maxillofacial structures was performed. Multiplanar CT image reconstructions were also generated. RADIATION DOSE REDUCTION: This exam was performed according to the departmental dose-optimization program which includes automated exposure control, adjustment of the mA and/or kV according to patient size and/or use of iterative reconstruction technique. COMPARISON:  None Available. FINDINGS: Osseous: No acute osseous abnormality. No discrete or worrisome osseous lesions. Orbits: Globes and orbital soft tissues within normal limits. Prior bilateral ocular lens replacement. Sinuses: Paranasal sinuses are largely clear. Mastoid air cells and middle ear cavities are well pneumatized and free of fluid. Soft tissues: Asymmetric enlargement with heterogeneous appearance of the right parotid gland, suspicious for acute parotitis. No obstructive stone seen within Stensen's duct. No discrete abscess or drainable fluid collection. Associated stranding noted within the overlying right parotid space, extending to partially involve the right submandibular and parapharyngeal spaces. Right submandibular gland itself within normal limits. Mild asymmetric prominence of subcentimeter upper right cervical lymph nodes, likely reactive. Limited intracranial: Age-related cerebral atrophy with chronic small vessel ischemic disease. Otherwise unremarkable. IMPRESSION: Asymmetric enlargement with  heterogeneous appearance of the right parotid gland, suspicious for acute parotitis. No obstructive stone. No discrete abscess or drainable fluid collection. Electronically Signed   By: Jeannine Boga M.D.   On: 02/10/2022 00:46   CT HEAD WO CONTRAST (5MM)  Result Date: 02/10/2022 CLINICAL DATA:  Delirium. EXAM: CT HEAD WITHOUT CONTRAST TECHNIQUE: Contiguous axial images were obtained from the base of the skull through the vertex without intravenous contrast. RADIATION DOSE REDUCTION: This exam was performed according to the departmental dose-optimization program which includes automated exposure control, adjustment of the mA and/or kV according to patient size and/or use of iterative reconstruction technique. COMPARISON:  Head CT 06/16/2014 FINDINGS: Brain: Progressive atrophy from 2015. Progressive periventricular chronic small vessel ischemic change. No hemorrhage or evidence of acute ischemia. No subdural or extra-axial collection. No midline shift, mass lesion or mass effect. Bifrontal atrophy but no extra-axial collection. Enlarged partially empty sella. Vascular: Atherosclerosis of skullbase vasculature without hyperdense vessel or abnormal calcification. Skull: No fracture or focal lesion. Incidental frontal hyperostosis. Sinuses/Orbits: Paranasal sinuses and mastoid air cells are clear. The visualized orbits are unremarkable. Other: None. IMPRESSION: 1. No acute intracranial abnormality. 2. Progressive atrophy from 2015. Progressive chronic small vessel ischemic change. Electronically Signed   By: Keith Rake M.D.   On: 02/10/2022 00:17   DG Chest 2 View  Result Date: 02/09/2022 CLINICAL DATA:  Altered mental status. EXAM: CHEST - 2 VIEW COMPARISON:  12/10/2015 FINDINGS: Mild patient rotation with anti lordotic positioning, difficulty positioning due to medical condition.The cardiomediastinal contours are normal. Skin folds project over both hemi thoraces. Pulmonary vasculature is normal.  No consolidation, pleural effusion, or pneumothorax. No acute osseous abnormalities are seen. IMPRESSION: No acute chest findings. Electronically Signed   By: Keith Rake M.D.   On: 02/09/2022 22:52        Scheduled Meds:  enoxaparin (LOVENOX) injection  30 mg Subcutaneous Q24H   insulin aspart  0-9 Units Subcutaneous Q4H   leptospermum manuka honey  1 Application Topical Daily   Continuous Infusions:  ceFEPime (MAXIPIME) IV 2 g (02/11/22 0910)   lactated ringers 125 mL/hr at 02/11/22 0446   metronidazole 500 mg (02/11/22 0900)   [START ON 02/12/2022] vancomycin  LOS: 1 day    Time spent: over 30 min Critically ill with 40 min critical care time.  Severe lactic acidosis, septic shock.   Fayrene Helper, MD [facility]   To contact the attending provider between 7A-7P or the covering provider during after hours 7P-7A, please log into the web site [email or web address] and access using universal [place] password for that web site. If you do not have the password, please call the hospital operator.  02/11/2022, 1:53 PM

## 2022-02-11 NOTE — Consult Note (Signed)
Consultation Note Date: 02/11/2022   Patient Name: Tamara Fry  DOB: 05/03/1946  MRN: [id number]  Age / Sex: 76 y.o., female  PCP: Collins, Dana, DO Referring Physician: Powell, A Caldwell Jr., *  Reason for Consultation: Establishing goals of care, "outpatient amedysis patient, admitted with presumed sepsis (source suspected either sacral decub vs parotitis).  sounds like has declined over past several weeks, was walking up with walker until about 2 weeks ago, now she's bedbound"  HPI/Patient Profile: 76 y.o. female  with past medical history of advanced dementia, stroke, CKD presented to ED on 02/09/22 from home with hyperglycemia and AMS. Patient is enrolled with hospice services through [facility]. Patient was admitted on 02/09/2022 with hyperosmolar hyperglycemic state, septic shock, decubitus ulcer, acute encephalopathy, AKI, FTT, persistent cognitive impairment.    Clinical Assessment and Goals of Care: I have reviewed medical records including EPIC notes, labs, and imaging. Received report from primary RN - no acute concerns. Per RN, patient has unstageable wounds, is disoriented, cannot follow commands, requiring mitt due to pulling at lines. Not eating/drinking due to NPO status.  Went to visit patient at bedside - no family/visitors present. Patient was lying in bed asleep - she does wake to voice/gentle touch. She immediately has signs/non-verbal gestures of pain and discomfort to include face grimacing, crying, moaning, restlessness. No respiratory distress, increased work of breathing, or secretions noted. Her eyes remain closed through interaction and she falls asleep shortly after being woken. Mitts are in place.  Met with son/Paul via phone  to discuss diagnosis, prognosis, GOC, EOL wishes, disposition, and options.  I introduced Palliative Medicine as specialized medical care for people living with serious  illness. It focuses on providing relief from the symptoms and stress of a serious illness. The goal is to improve quality of life for both the patient and the family.  We discussed a brief life review of the patient as well as functional and nutritional status. Patient is widowed - she has 2 sons. Prior to hospitalization, she was living in a private residence where her son would often stay with her. Sons also had cameras in the home for sons to watch after her. Prior to a couple of weeks ago, patient was ambulatory with a walker and could communicate. She is now bedbound, often agitated, unable to speak coherently. Son has noticed "two large drop offs over the last several months." In January, she was able to walk and talk but still with some confusion.  Paul reports she has had a poor appetite, not eating/drinking much - Albumin noted at 2.4 on 02/09/22. Due to this drastic decline, family enrolled patient in [facility] hospice last Friday.   We discussed patient's current illness and what it means in the larger context of patient's on-going co-morbidities.  Paul has a clear understanding of patient's current acute medication situation. We reviewed specific indicators of end stage dementia, including inability to communicate, bed bound/non-ambulatory status, decreased oral intake, swallowing dysfunction, and incontinence of bowel/bladder. Patient's history is significant for gradually worsening progressive symptoms of dementia. Natural disease trajectory and expectations at EOL were discussed. I attempted to elicit values and goals of care important to the patient. The difference between aggressive medical intervention and comfort care was considered in light of the patient's goals of care. Paul states, "I know she's not going to get any better." He does not want "extreme measures" to prolong her life.   We talked about transition to comfort measures in house and what that   would entail inclusive of medications  to control pain, dyspnea, agitation, nausea, and itching. We discussed stopping all unnecessary measures such as blood draws, needle sticks, oxygen, antibiotics, CBGs/insulin, cardiac monitoring, IVF, and frequent vital signs. Paul is agreeable for transition to full comfort care today. Prognostication reviewed.  Provided education and counseling at length on the philosophy and benefits of hospice care. Discussed that it offers a holistic approach to care in the setting of end-stage illness, and is about supporting the patient where they are allowing nature to take it's course. Discussed the hospice team includes RNs, physicians, social workers, and chaplains. They can provide personal care, support for the family, and help keep patient out of the hospital as well as assist with DME needs for home hospice. Education provided on the difference between home vs residential hospice. Provided reassurance that residential hospice referral could be cancelled (would anticipate hospital death) at any time if patient's condition changed and it was felt they were too unstable for transfer. Paul would like to discuss hospice options with his brother before making final decisions.   Visit also consisted of discussions dealing with the complex and emotionally intense issues of symptom management and palliative care in the setting of serious and life-threatening illness.   Discussed with Paul the importance of continued conversation with each other and the medical providers regarding overall plan of care and treatment options, ensuring decisions are within the context of the patient's values and GOCs.    Questions and concerns were addressed. The patient/family was encouraged to call with questions and/or concerns. PMT number was provided.   Primary Decision Maker: NEXT OF KIN - son/[name]    SUMMARY OF RECOMMENDATIONS   Initiated full comfort measures Continue DNR/DNI as previously documented - durable DNR form  completed and placed in shadow chart. Copy was made and will be scanned into Vynca/ACP tab Family deciding between patient returning home with hospice (already enrolled with [facility]) vs residential hospice facility ([facility] does not offer this option, would have to switch agencies) Continue palliative wound care Remove mitts when comfortable Added orders for EOL symptom management and to reflect full comfort measures, as well as discontinued orders that were not focused on comfort Unrestricted visitation orders were placed per current [place] COVID19 EOL visitation policy  Nursing to provide frequent assessments and administer PRN medications as clinically necessary to ensure EOL comfort PMT will continue to follow and support holistically  Symptom Management Morphine PRN pain/dyspnea/increased work of breathing/RR>25/distress Tylenol PRN pain/fever Biotin twice daily Benadryl PRN itching Robinul PRN secretions Haldol PRN agitation/delirium Ativan PRN anxiety/seizure/sleep/distress Zofran PRN nausea/vomiting Liquifilm Tears PRN dry eye   Code Status/Advance Care Planning: DNR  Palliative Prophylaxis:  Aspiration, Bowel Regimen, Delirium Protocol, Frequent Pain Assessment, Oral Care, Palliative Wound Care, and Turn Reposition  Additional Recommendations (Limitations, Scope, Preferences): Full Comfort Care  Psycho-social/Spiritual:  Desire for further Chaplaincy support:no Created space and opportunity for patient and family to express thoughts and feelings regarding patient's current medical situation.  Emotional support and therapeutic listening provided.  Prognosis:  < 2 weeks  Discharge Planning: To Be Determined      Primary Diagnoses: Present on Admission:  Acute encephalopathy  Hyperosmolar hyperglycemic state (HHS) (HCC)  Hypernatremia  Persistent cognitive impairment  CKD (chronic kidney disease) stage 4, GFR 15-29 ml/min (HCC)  Essential hypertension   Leukocytosis  Acute parotitis   I have reviewed the medical record, interviewed the patient and family, and examined the patient. The following aspects are   pertinent.  Past Medical History:  Diagnosis Date   Chronic kidney disease    Stage 4   Depression    Diabetes mellitus    Type I per office notes   Hyperlipidemia    Hypertension    Memory loss    "short term"   Migraine    Stroke (HCC)    TIA - no residual   Social History   Socioeconomic History   Marital status: Married    Spouse name: Not on file   Number of children: Not on file   Years of education: Not on file   Highest education level: Not on file  Occupational History   Occupation: retired  Tobacco Use   Smoking status: Former    Packs/day: 1.00    Years: 5.00    Total pack years: 5.00    Types: Cigarettes   Smokeless tobacco: Never   Tobacco comments:    06/22/17- "quit at least 40 years ago"  Vaping Use   Vaping Use: Never used  Substance and Sexual Activity   Alcohol use: No    Alcohol/week: 0.0 standard drinks of alcohol   Drug use: No   Sexual activity: Never  Other Topics Concern   Not on file  Social History Narrative   Not on file   Social Determinants of Health   Financial Resource Strain: Not on file  Food Insecurity: Not on file  Transportation Needs: Not on file  Physical Activity: Not on file  Stress: Not on file  Social Connections: Not on file   Family History  Problem Relation Age of Onset   Hypertension Mother    Diabetes Mellitus II Father    Prostate cancer Father    Scheduled Meds:  enoxaparin (LOVENOX) injection  30 mg Subcutaneous Q24H   insulin aspart  0-9 Units Subcutaneous Q4H   leptospermum manuka honey  1 Application Topical Daily   Continuous Infusions:  ceFEPime (MAXIPIME) IV 2 g (02/11/22 0910)   lactated ringers 125 mL/hr at 02/11/22 0446   metronidazole 500 mg (02/11/22 0900)   [START ON 02/12/2022] vancomycin     PRN Meds:.dextrose, haloperidol  lactate, LORazepam, morphine injection Medications Prior to Admission:  Prior to Admission medications   Medication Sig Start Date End Date Taking? Authorizing Provider  acetaminophen (TYLENOL) 650 MG CR tablet Take 650 mg by mouth every 8 (eight) hours as needed for pain or fever.   Yes [provider]  insulin lispro (HUMALOG) 100 UNIT/ML injection Inject 4-16 Units into the skin 2 (two) times daily as needed for high blood sugar.   Yes [provider]  lactose free nutrition (BOOST) LIQD Take 237 mLs by mouth in the morning and at bedtime.   Yes [provider]  atorvastatin (LIPITOR) 20 MG tablet Take 1 tablet (20 mg total) by mouth daily. Patient not taking: Reported on 02/10/2022 03/10/18 01/10/19  Krasowski, Robert J, MD  donepezil (ARICEPT) 10 MG tablet Take 1 tablet (10 mg total) by mouth daily. Patient not taking: Reported on 02/10/2022 06/20/15   Aquino, Karen M, MD  insulin glargine (LANTUS) 100 UNIT/ML injection Inject 0.2 mLs (20 Units total) into the skin daily. Patient not taking: Reported on 02/10/2022 01/12/19   Spongberg, Christopher N, MD  nebivolol (BYSTOLIC) 10 MG tablet Take 0.5 tablets (5 mg total) by mouth daily. Patient not taking: Reported on 02/10/2022 06/17/14   Akula, Vijaya, MD  sitaGLIPtin (JANUVIA) 25 MG tablet Take 1 tablet (25 mg total) by mouth daily.   Patient not taking: Reported on 02/10/2022 01/11/19   Spongberg, Christopher N, MD   No Known Allergies Review of Systems  Unable to perform ROS: Acuity of condition    Physical Exam Vitals and nursing note reviewed.  Constitutional:      General: She is not in acute distress.    Appearance: She is cachectic. She is ill-appearing.  Pulmonary:     Effort: No respiratory distress.  Skin:    General: Skin is warm and dry.  Neurological:     Mental Status: She is lethargic, disoriented and confused.     Motor: Weakness present.  Psychiatric:        Mood and Affect: Mood is anxious.  Affect is tearful.        Speech: Speech is slurred.        Behavior: Behavior is agitated.        Cognition and Memory: Cognition is impaired. Memory is impaired.     Vital Signs: BP 90/78 (BP Location: Left Arm)   Pulse 78   Temp 97.6 F (36.4 C) (Axillary)   Resp 17   Wt 51.9 kg   SpO2 92%   BMI 18.47 kg/m  Pain Scale: 0-10   Pain Score: 0-No pain   SpO2: SpO2: 92 % O2 Device:SpO2: 92 % O2 Flow Rate: .   IO: Intake/output summary:  Intake/Output Summary (Last 24 hours) at 02/11/2022 1454 Last data filed at 02/11/2022 0500 Gross per 24 hour  Intake --  Output 400 ml  Net -400 ml    LBM:   Baseline Weight: Weight: 51.9 kg Most recent weight: Weight: 51.9 kg     Palliative Assessment/Data: PPS 10-20%     Signed by:  M , NP   Please contact Palliative Medicine Team phone at [phone] for questions and concerns.  For individual provider: See Amion  *Portions of this note are a verbal dictation therefore any spelling and/or grammatical errors are due to the "Dragon Medical One" system interpretation.  

## 2022-02-11 NOTE — Progress Notes (Signed)
Pharmacy Antibiotic Note  Tamara Fry is a 76 y.o. female admitted on 02/09/2022 with cellulitis.  Pharmacy has been consulted for vancomycin and cefepime dosing. Pt is afebrile but WBC is elevated. Lactic is elevated and Scr is trending down. Recalculating dose based on improved Scr.   Plan: Change Vancomycin 1250mg  IV Q48H starting 6/30 Continue Cefepime 2gm IV Q12H F/u renal fxn, C&S, clinical status and peak/trough at SS Weight: 51.9 kg (114 lb 6.7 oz)  Temp (24hrs), Avg:97.6 F (36.4 C), Min:96.9 F (36.1 C), Max:98 F (36.7 C)  Recent Labs  Lab 02/09/22 2304 02/10/22 0052 02/10/22 0330 02/10/22 0609 02/10/22 0651 02/10/22 1550 02/10/22 1944 02/11/22 0139  WBC 22.4*  --   --   --   --   --   --  20.2*  CREATININE 1.85*  --  1.50*  --  1.28* 1.17* 0.76 0.99  LATICACIDVEN 7.4* 7.3* 5.7* >9.0*  --  6.9*  --  5.7*     CrCl cannot be calculated (Unknown ideal weight.).    No Known Allergies  Antimicrobials this admission: Vanc 6/28>> Cefepime 6/28>> CTX x1 6/28 Flagyl 6/28>>  Dose adjustments this admission: N/A  Microbiology results: 6/27 Blood - NGTD 6/27 MRSA - NEG  Tamara Fry A. Levada Dy, PharmD, BCPS, FNKF Clinical Pharmacist [place] Please utilize Amion for appropriate phone number to reach the unit pharmacist ([place])  02/11/2022 9:38 AM

## 2022-02-11 NOTE — Inpatient Diabetes Management (Signed)
Inpatient Diabetes Program Recommendations  AACE/ADA: New Consensus Statement on Inpatient Glycemic Control (2015)  Target Ranges:  Prepandial:   less than 140 mg/dL      Peak postprandial:   less than 180 mg/dL (1-2 hours)      Critically ill patients:  140 - 180 mg/dL   Lab Results  Component Value Date   GLUCAP 60 (L) 02/11/2022   HGBA1C 11.3 (H) 02/10/2022    Review of Glycemic Control  Latest Reference Range & Units 02/10/22 09:47 02/10/22 10:51 02/10/22 12:32 02/10/22 15:46 02/10/22 21:26 02/11/22 01:11 02/11/22 05:31 02/11/22 08:19  Glucose-Capillary 70 - 99 mg/dL 136 (H) 143 (H) 161 (H) 150 (H) 193 (H) 138 (H) 97 60 (L)   Diabetes history: DM 2 Outpatient Diabetes medications: Humalog 4-16 units bid (was on lantus 20 and Januvia 25 mg Daily in the past)  Current orders for Inpatient glycemic control:  Levemir 5 units bid Novolog ICU Glycemic Control scale 1-3 units Q4 hours  Inpatient Diabetes Program Recommendations:    -  Change Novolog Correction to "very sensitive" Novolog 0-6 units Q4 hours  Thanks,  Tama Headings RN, MSN, BC-ADM Inpatient Diabetes Coordinator Team Pager [phone] (8a-5p)

## 2022-02-12 DIAGNOSIS — R52 Pain, unspecified: Secondary | ICD-10-CM

## 2022-02-12 DIAGNOSIS — Z7189 Other specified counseling: Secondary | ICD-10-CM

## 2022-02-12 DIAGNOSIS — E1100 Type 2 diabetes mellitus with hyperosmolarity without nonketotic hyperglycemic-hyperosmolar coma (NKHHC): Secondary | ICD-10-CM | POA: Diagnosis not present

## 2022-02-12 DIAGNOSIS — Z515 Encounter for palliative care: Secondary | ICD-10-CM

## 2022-02-12 DIAGNOSIS — A419 Sepsis, unspecified organism: Principal | ICD-10-CM

## 2022-02-12 DIAGNOSIS — R6521 Severe sepsis with septic shock: Secondary | ICD-10-CM

## 2022-02-12 DIAGNOSIS — E872 Acidosis, unspecified: Secondary | ICD-10-CM

## 2022-02-12 DIAGNOSIS — K1121 Acute sialoadenitis: Secondary | ICD-10-CM | POA: Diagnosis not present

## 2022-02-12 DIAGNOSIS — G9340 Encephalopathy, unspecified: Secondary | ICD-10-CM | POA: Diagnosis not present

## 2022-02-12 DIAGNOSIS — Z789 Other specified health status: Secondary | ICD-10-CM

## 2022-02-12 DIAGNOSIS — N179 Acute kidney failure, unspecified: Secondary | ICD-10-CM | POA: Diagnosis not present

## 2022-02-12 DIAGNOSIS — R627 Adult failure to thrive: Secondary | ICD-10-CM

## 2022-02-12 DIAGNOSIS — Z66 Do not resuscitate: Secondary | ICD-10-CM

## 2022-02-12 LAB — URINE CULTURE

## 2022-02-12 MED ORDER — MORPHINE SULFATE (PF) 2 MG/ML IV SOLN
2.0000 mg | INTRAVENOUS | Status: DC
  Administered 2022-02-12 – 2022-02-13 (×6): 2 mg via INTRAVENOUS
  Filled 2022-02-12 (×6): qty 1

## 2022-02-12 NOTE — Progress Notes (Signed)
PROGRESS NOTE    Tamara Fry  VVO:[id number] DOB: [birth year] DOA: 02/09/2022 PCP: Janie Morning, DO  Chief Complaint  Patient presents with   Altered Mental Status   Hyperglycemia    Brief Narrative:  Tamara Fry is Braley Luckenbaugh 76 y.o. female with medical history significant of advanced dementia, just started on hospice.  H/o CVA, T2DM, CKD IV, HTN, HLD, and multiple other medical issues here with hyperglycemia and worsening mental status.  She had continued decline.  Comfort measures at this point.  Plan for [facility].   See below for additional details    Assessment & Plan:   Principal Problem:   Comfort measures only status Active Problems:   Goals of care, counseling/discussion   Leukocytosis   Septic shock (HCC)   Lactic acidosis   Hyperosmolar hyperglycemic state (HHS) (HCC)   Decubitus ulcer   Acute parotitis   Acute encephalopathy   AKI (acute kidney injury) ([place])   CKD (chronic kidney disease) stage 4, GFR 15-29 ml/min (HCC)   Hypernatremia   Essential hypertension   Persistent cognitive impairment   Failure to thrive in adult   Assessment and Plan: * Comfort measures only status Appreciate palliative care Plan at this time is full comfort measures Plan for [facility] bed available  Goals of care, counseling/discussion Transitioned to DNR 6/28.   Comfort measures Palliative care c/s  Septic shock (HCC) Leukocytosis, tachycardia, tachypnea, intermittent hypotension, lactic >9 and concern for parotitis vs infected decubitus ulcer (UTI and pneumonia other potential sources with UA and small R effusion).   Now comfort measures  Lactic acidosis Due to sepsis Follow with IVF Gradually downtrending  Decubitus ulcer Unstageable, suspect infected, foul smelling CT abd/pelvis without clear explanation for leukocytosis -> no well defined decubitus ulcer seen  Now comfort measures  Hyperosmolar hyperglycemic state (HHS) (HCC) Comfort measures  Acute  parotitis Question of acute parotitis of R parotid gland    Acute encephalopathy Related to hyperglycemia, hypernatremia, and infection Head CT without acute abnormality  Comfort measures as above  AKI (acute kidney injury) ([place]) Creatinine improved Follow bladder scans CT without hydro   Hypernatremia Comfort measures  CKD (chronic kidney disease) stage 4, GFR 15-29 ml/min (HCC) Creat of 1.8 actually looks to be her baseline.  Essential hypertension Hold BP meds  Persistent cognitive impairment AAOx0 at baseline it sounds like But she's able to speak (nonsensical typically), declined as noted above See acute encephalopathy above. Current state represents decline from her baseline  Failure to thrive in adult       DVT prophylaxis: comfort measures Code Status: DNR Family Communication: paul Disposition:   Status is: Inpatient Remains inpatient appropriate because: need for abx, additional goc conversations   Consultants:  palliative  Procedures:  none  Antimicrobials:  Anti-infectives (From admission, onward)    Start     Dose/Rate Route Frequency Ordered Stop   02/12/22 1000  vancomycin (VANCOCIN) IVPB 1000 mg/200 mL premix  Status:  Discontinued        1,000 mg 200 mL/hr over 60 Minutes Intravenous Every 48 hours 02/10/22 0920 02/11/22 0937   02/12/22 1000  vancomycin (VANCOREADY) IVPB 1250 mg/250 mL  Status:  Discontinued        1,250 mg 166.7 mL/hr over 90 Minutes Intravenous Every 48 hours 02/11/22 0937 02/11/22 1630   02/10/22 1000  ceFEPIme (MAXIPIME) 2 g in sodium chloride 0.9 % 100 mL IVPB  Status:  Discontinued        2  g 200 mL/hr over 30 Minutes Intravenous Every 12 hours 02/10/22 0938 02/11/22 1630   02/10/22 0930  vancomycin (VANCOCIN) IVPB 1000 mg/200 mL premix        1,000 mg 200 mL/hr over 60 Minutes Intravenous  Once 02/10/22 0916 02/10/22 1255   02/10/22 0215  metroNIDAZOLE (FLAGYL) IVPB 500 mg  Status:  Discontinued        500  mg 100 mL/hr over 60 Minutes Intravenous Every 12 hours 02/10/22 0201 02/11/22 1630   02/10/22 0115  cefTRIAXone (ROCEPHIN) 2 g in sodium chloride 0.9 % 100 mL IVPB  Status:  Discontinued        2 g 200 mL/hr over 30 Minutes Intravenous Every 24 hours 02/10/22 0105 02/10/22 0932   02/10/22 0100  ceFAZolin (ANCEF) IVPB 1 g/50 mL premix  Status:  Discontinued        1 g 100 mL/hr over 30 Minutes Intravenous  Once 02/10/22 0050 02/10/22 0105       Subjective: sleeping   Objective: Vitals:   02/11/22 1205 02/11/22 1617 02/11/22 2048 02/12/22 1210  BP: 90/78 (!) 73/63 (!) 87/48   Pulse: 78 86    Resp: 17 20    Temp: 97.6 F (36.4 C) 97.6 F (36.4 C) 97.9 F (36.6 C) 97.7 F (36.5 C)  TempSrc: Axillary Axillary Axillary Axillary  SpO2: 92% 90% 92%   Weight:        Intake/Output Summary (Last 24 hours) at 02/12/2022 1550 Last data filed at 02/12/2022 0600 Gross per 24 hour  Intake 0 ml  Output 325 ml  Net -325 ml   Filed Weights   02/10/22 1702  Weight: 51.9 kg    Examination:  General: No acute distress. Appears comfortable Lungs: steady respirations Neurological: sleeping  Data Reviewed: I have personally reviewed following labs and imaging studies  CBC: Recent Labs  Lab 02/09/22 2304 02/09/22 2321 02/11/22 0139  WBC 22.4*  --  20.2*  NEUTROABS 19.3*  --   --   HGB 12.6 12.6 10.6*  HCT 39.7 37.0 35.7*  MCV 95.9  --  100.0  PLT 274  --  253    Basic Metabolic Panel: Recent Labs  Lab 02/10/22 0330 02/10/22 0651 02/10/22 1550 02/10/22 1944 02/11/22 0139  NA 148* 150* 146* 144 148*  K 4.7 4.0 4.4 4.5 4.2  CL 111 113* 112* 114* 114*  CO2 24 25 25  18* 23  GLUCOSE 355* 198* 180* 183* 167*  BUN 49* 48* 41* 33* 37*  CREATININE 1.50* 1.28* 1.17* 0.76 0.99  CALCIUM 9.6 9.7 9.3 8.4* 9.1    GFR: CrCl cannot be calculated (Unknown ideal weight.).  Liver Function Tests: Recent Labs  Lab 02/09/22 2304  AST 27  ALT 18  ALKPHOS 116  BILITOT 0.9   PROT 6.2*  ALBUMIN 2.4*    CBG: Recent Labs  Lab 02/11/22 0111 02/11/22 0531 02/11/22 0819 02/11/22 1151 02/11/22 1616  GLUCAP 138* 97 60* 81 51*     Recent Results (from the past 240 hour(s))  Blood culture (routine x 2)     Status: Abnormal (Preliminary result)   Collection Time: 02/09/22 10:56 PM   Specimen: BLOOD RIGHT ARM  Result Value Ref Range Status   Specimen Description BLOOD RIGHT ARM  Final   Special Requests   Final    BOTTLES DRAWN AEROBIC AND ANAEROBIC Blood Culture results may not be optimal due to an inadequate volume of blood received in culture bottles   Culture  Setup Time  Final    GRAM POSITIVE COCCI IN CLUSTERS ANAEROBIC BOTTLE ONLY CRITICAL RESULT CALLED TO, READ BACK BY AND VERIFIED WITH: PHARMD K. AMEND [phone] @2114  FH    Culture (Landry Kamath)  Final    STAPHYLOCOCCUS EPIDERMIDIS SUSCEPTIBILITIES TO FOLLOW Performed at [facility], [place] [address] [place], [place] [address]    Report Status PENDING  Incomplete  Blood Culture ID Panel (Reflexed)     Status: Abnormal   Collection Time: 02/09/22 10:56 PM  Result Value Ref Range Status   Enterococcus faecalis NOT DETECTED NOT DETECTED Final   Enterococcus Faecium NOT DETECTED NOT DETECTED Final   Listeria monocytogenes NOT DETECTED NOT DETECTED Final   Staphylococcus species DETECTED (Jackie Littlejohn) NOT DETECTED Final    Comment: CRITICAL RESULT CALLED TO, READ BACK BY AND VERIFIED WITH: PHARMD K. AMEND [id number] @2114  FH    Staphylococcus aureus (BCID) NOT DETECTED NOT DETECTED Final   Staphylococcus epidermidis DETECTED (Lamara Brecht) NOT DETECTED Final    Comment: Methicillin (oxacillin) resistant coagulase negative staphylococcus. Possible blood culture contaminant (unless isolated from more than one blood culture draw or clinical case suggests pathogenicity). No antibiotic treatment is indicated for blood  culture contaminants. CRITICAL RESULT CALLED TO, READ BACK BY AND VERIFIED WITH: PHARMD K. AMEND [phone] @2114   FH    Staphylococcus lugdunensis NOT DETECTED NOT DETECTED Final   Streptococcus species NOT DETECTED NOT DETECTED Final   Streptococcus agalactiae NOT DETECTED NOT DETECTED Final   Streptococcus pneumoniae NOT DETECTED NOT DETECTED Final   Streptococcus pyogenes NOT DETECTED NOT DETECTED Final   Audrinna Sherman.calcoaceticus-baumannii NOT DETECTED NOT DETECTED Final   Bacteroides fragilis NOT DETECTED NOT DETECTED Final   Enterobacterales NOT DETECTED NOT DETECTED Final   Enterobacter cloacae complex NOT DETECTED NOT DETECTED Final   Escherichia coli NOT DETECTED NOT DETECTED Final   Klebsiella aerogenes NOT DETECTED NOT DETECTED Final   Klebsiella oxytoca NOT DETECTED NOT DETECTED Final   Klebsiella pneumoniae NOT DETECTED NOT DETECTED Final   Proteus species NOT DETECTED NOT DETECTED Final   Salmonella species NOT DETECTED NOT DETECTED Final   Serratia marcescens NOT DETECTED NOT DETECTED Final   Haemophilus influenzae NOT DETECTED NOT DETECTED Final   Neisseria meningitidis NOT DETECTED NOT DETECTED Final   Pseudomonas aeruginosa NOT DETECTED NOT DETECTED Final   Stenotrophomonas maltophilia NOT DETECTED NOT DETECTED Final   Candida albicans NOT DETECTED NOT DETECTED Final   Candida auris NOT DETECTED NOT DETECTED Final   Candida glabrata NOT DETECTED NOT DETECTED Final   Candida krusei NOT DETECTED NOT DETECTED Final   Candida parapsilosis NOT DETECTED NOT DETECTED Final   Candida tropicalis NOT DETECTED NOT DETECTED Final   Cryptococcus neoformans/gattii NOT DETECTED NOT DETECTED Final   Methicillin resistance mecA/C DETECTED (Trace Cederberg) NOT DETECTED Final    Comment: CRITICAL RESULT CALLED TO, READ BACK BY AND VERIFIED WITH: PHARMD K. AMEND [phone] @2114  FH Performed at [facility], [place] [address], [place], [place] [address]   Blood culture (routine x 2)     Status: Abnormal (Preliminary result)   Collection Time: 02/09/22 11:15 PM   Specimen: BLOOD LEFT ARM  Result Value Ref Range Status    Specimen Description BLOOD LEFT ARM  Final   Special Requests   Final    BOTTLES DRAWN AEROBIC ONLY Blood Culture adequate volume   Culture  Setup Time   Final    GRAM POSITIVE COCCI IN CLUSTERS AEROBIC BOTTLE ONLY CRITICAL RESULT CALLED TO, READ BACK BY AND VERIFIED WITH: PHARMD ANDREW  MEYER ON 02/11/22 @ 1601 BY DRT Performed at [facility], [place] [address], [place], [place] [address]    Culture STAPHYLOCOCCUS EPIDERMIDIS (Sharia Averitt)  Final   Report Status PENDING  Incomplete  MRSA Next Gen by PCR, Nasal     Status: None   Collection Time: 02/10/22  3:40 AM   Specimen: Nasal Mucosa; Nasal Swab  Result Value Ref Range Status   MRSA by PCR Next Gen NOT DETECTED NOT DETECTED Final    Comment: (NOTE) The GeneXpert MRSA Assay (FDA approved for NASAL specimens only), is one component of [facility] comprehensive MRSA colonization surveillance program. It is not intended to diagnose MRSA infection nor to guide or monitor treatment for MRSA infections. Test performance is not FDA approved in patients less than 12 years old. Performed at [facility], [place] [address], [place], [place] [address]   Urine Culture     Status: Abnormal   Collection Time: 02/10/22 12:30 PM   Specimen: In/Out Cath Urine  Result Value Ref Range Status   Specimen Description IN/OUT CATH URINE  Final   Special Requests   Final    NONE Performed at [facility], [place] [address] [place], [place] [address]    Culture MULTIPLE SPECIES PRESENT, SUGGEST RECOLLECTION (Clearnce Leja)  Final   Report Status 02/12/2022 FINAL  Final  Culture, blood (Routine X 2) w Reflex to ID Panel     Status: None (Preliminary result)   Collection Time: 02/11/22  7:30 AM   Specimen: BLOOD  Result Value Ref Range Status   Specimen Description BLOOD LEFT ANTECUBITAL  Final   Special Requests   Final    BOTTLES DRAWN AEROBIC AND ANAEROBIC Blood Culture adequate volume   Culture   Final    NO GROWTH 1 DAY Performed at [facility],  [place] [address] [place], [place] [address]    Report Status PENDING  Incomplete  Culture, blood (Routine X 2) w Reflex to ID Panel     Status: None (Preliminary result)   Collection Time: 02/11/22  7:39 AM   Specimen: BLOOD LEFT FOREARM  Result Value Ref Range Status   Specimen Description BLOOD LEFT FOREARM  Final   Special Requests   Final    BOTTLES DRAWN AEROBIC ONLY Blood Culture results may not be optimal due to an inadequate volume of blood received in culture bottles   Culture   Final    NO GROWTH 1 DAY Performed at [facility], [place] [address], [place], [place] [address]    Report Status PENDING  Incomplete         Radiology Studies: No results found.      Scheduled Meds:  antiseptic oral rinse  15 mL Topical BID   leptospermum manuka honey  1 Application Topical Daily    morphine injection  2 mg Intravenous Q4H   Continuous Infusions:     LOS: 2 days    Time spent: over 30 min   Fayrene Helper, MD [facility]   To contact the attending provider between 7A-7P or the covering provider during after hours 7P-7A, please log into the web site [email or web address] and access using universal [place] password for that web site. If you do not have the password, please call the hospital operator.  02/12/2022, 3:50 PM

## 2022-02-12 NOTE — Care Management (Signed)
Spoke to patients son, he spoke to his brother and they are in agreement to move the patient to [facility]. RN and Palliative aware, will persue a bed.

## 2022-02-12 NOTE — Progress Notes (Signed)
Patient KD:XIPJ C Mealor      DOB: 01-26-1946      ASN:[id number]      Palliative Medicine Team    Subjective: Bedside symptom check completed. No family or visitors present at time of visit.    Physical exam: Patient resting in bed with eyes closed at time of visit. Breathing even and non-labored, no excessive secretions noted. Patient without physical or non-verbal signs of pain or discomfort at this time. This RN did not attempt to further stimulate patient, to ensure comfort.    Assessment and plan: Will continue to follow for any changes or advances.    Thank you for allowing the Palliative Medicine Team to assist in the care of this patient.     Damian Leavell, MSN, RN Palliative Medicine Team Team Phone: [phone]  This phone is monitored 7a-7p, please reach out to attending physician outside of these hours for urgent needs.

## 2022-02-12 NOTE — Care Management Important Message (Signed)
Important Message  Patient Details  Name: Tamara Fry MRN: [id number] Date of Birth: 05/15/1946   Medicare Important Message Given:  Yes  Patient is at the end of life out of respect for the patient and family no IM given will mail to the patient home address.    Jannifer Fischler 02/12/2022, 4:00 PM

## 2022-02-12 NOTE — Progress Notes (Addendum)
Daily Progress Note   Patient Name: Tamara Fry       Date: 02/12/2022 DOB: 1946-02-20  Age: 76 y.o. MRN#: [id number] Attending Physician: Elodia Florence., * Primary Care Physician: Janie Morning, DO Admit Date: 02/09/2022  Reason for Consultation/Follow-up: Establishing goals of care, "outpatient amedysis patient, admitted with presumed sepsis (source suspected either sacral decub vs parotitis).  sounds like has declined over past several weeks, was walking up with walker until about 2 weeks ago, now she's bedbound"  Subjective: Chart review performed - noted frequent doses of PRN morphine given. Received report from primary RN - no acute concerns. RN reports patient remains lethargic, not eating/drinking.  Went to visit patient at bedside - she is awake but with eyes closed - RN is performing wound care. Signs/non-verbal gestures of pain/ discomfort noted to include facial grimacing - not crying or moaning as noted yesterday. No respiratory distress, increased work of breathing, or secretions noted. Requested RN provide dose of morphine. Reviewed symptom management plan to include scheduling morphine.  Called son/Paul - emotional support provided. Provided updates from my and RN assessment from today. Discussed disposition options of residential hospice vs returning home with hospice (already enrolled with [facility]). Discharge time line reviewed for both options per his request. Discussed residential hospice options per his request - [facility] would be closest facility to family. He would like to discuss information given to him today with his brother before making final decisions - will call PMT with any decisions.  All questions and concerns addressed. Encouraged to call with questions  and/or concerns. PMT card previously provided.  Later received updates from [facility] that family have decided on patient's transfer to [facility]  Length of Stay: 2  Current Medications: Scheduled Meds:   antiseptic oral rinse  15 mL Topical BID   leptospermum manuka honey  1 Application Topical Daily    Continuous Infusions:   PRN Meds: acetaminophen **OR** acetaminophen, diphenhydrAMINE, glycopyrrolate **OR** glycopyrrolate **OR** glycopyrrolate, haloperidol **OR** haloperidol **OR** haloperidol lactate, LORazepam **OR** LORazepam **OR** LORazepam, morphine injection, ondansetron **OR** ondansetron (ZOFRAN) IV, polyvinyl alcohol  Physical Exam Vitals and nursing note reviewed.  Constitutional:      General: She is not in acute distress.    Appearance: She is cachectic. She is ill-appearing.  Pulmonary:     Effort:  No respiratory distress.  Skin:    General: Skin is warm and dry.  Neurological:     Mental Status: She is lethargic.     Motor: Weakness present.  Psychiatric:        Mood and Affect: Mood is anxious.        Speech: She is noncommunicative.        Behavior: Behavior is withdrawn.             Vital Signs: BP (!) 87/48 (BP Location: Left Arm)   Pulse 86   Temp 97.9 F (36.6 C) (Axillary)   Resp 20   Wt 51.9 kg   SpO2 92%   BMI 18.47 kg/m  SpO2: SpO2: 92 % O2 Device: O2 Device: Room Air O2 Flow Rate:    Intake/output summary:  Intake/Output Summary (Last 24 hours) at 02/12/2022 1142 Last data filed at 02/12/2022 0600 Gross per 24 hour  Intake 0 ml  Output 325 ml  Net -325 ml   LBM:   Baseline Weight: Weight: 51.9 kg Most recent weight: Weight: 51.9 kg       Palliative Assessment/Data: PPS 10%      Patient Active Problem List   Diagnosis Date Noted   Acute encephalopathy 02/10/2022   Hyperosmolar hyperglycemic state (HHS) ([place]) 02/10/2022   Hypernatremia 02/10/2022   Leukocytosis 02/10/2022   Decubitus ulcer 02/10/2022   Acute parotitis  02/10/2022   Septic shock ([place]) 02/10/2022   Lactic acidosis 02/10/2022   AKI (acute kidney injury) ([place]) 02/10/2022   Failure to thrive in adult 02/10/2022   Goals of care, counseling/discussion 02/10/2022   Uncontrolled diabetes mellitus 01/10/2019   CKD (chronic kidney disease) stage 4, GFR 15-29 ml/min (HCC) 01/10/2019   Essential hypertension 06/20/2015   Persistent cognitive impairment 12/18/2014   Migraine without aura and without status migrainosus, not intractable 12/18/2014   Benign paroxysmal positional vertigo 12/18/2014   Vertigo 06/16/2014   Migraine, unspecified, without mention of intractable migraine without mention of status migrainosus 04/20/2014   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 female  with past medical history of advanced dementia, stroke, CKD presented to ED on 02/09/22 from home with hyperglycemia and AMS. Patient is enrolled with hospice services through [facility]. Patient was admitted on 02/09/2022 with hyperosmolar hyperglycemic state, septic shock, decubitus ulcer, acute encephalopathy, AKI, FTT, persistent cognitive impairment.   Assessment: Principal Problem:   Hyperosmolar hyperglycemic state (HHS) ([place]) Active Problems:   Persistent cognitive impairment   Essential hypertension   CKD (chronic kidney disease) stage 4, GFR 15-29 ml/min (HCC)   Acute encephalopathy   Hypernatremia   Leukocytosis   Decubitus ulcer   Acute parotitis   Septic shock (HCC)   Lactic acidosis   AKI (acute kidney injury)  ([place])   Failure to thrive in adult   Goals of care, counseling/discussion   Terminal care  Recommendations/Plan: Continue full comfort measures Continue DNR/DNI as previously documented Family agreeable for patient transfer to [facility] - transfer when bed available Scheduled morphine q4h Continue other comfort focused medication  regimen Continue palliative wound care PMT will continue to follow peripherally. If there are any imminent needs please call the service directly  Symptom Management Morphine q4hr; continue PRN doses for breakthrough symptoms Tylenol PRN pain/fever Biotin twice daily Benadryl PRN itching Robinul PRN secretions Haldol PRN agitation/delirium Ativan PRN anxiety/seizure/sleep/distress Zofran PRN nausea/vomiting Liquifilm Tears PRN dry eye   Goals of Care and Additional Recommendations: Limitations on Scope of Treatment: Full Comfort Care  Code Status:    Code Status Orders  (From admission, onward)           Start     Ordered   Unscheduled  Do not attempt resuscitation (DNR)  Continuous       Question Answer Comment  In the event of cardiac or respiratory ARREST Do not call a "code blue"   In the event of cardiac or respiratory ARREST Do not perform Intubation, CPR, defibrillation or ACLS   In the event of cardiac or respiratory ARREST Use medication by any route, position, wound care, and other measures to relive pain and suffering. May use oxygen, suction and manual treatment of airway obstruction as needed for comfort.      02/11/22 1630           Code Status History     Date Active Date Inactive Code Status Order ID Comments User Context   02/10/2022 0945 02/11/2022 1630 DNR [id number]  Elodia Florence., MD ED   02/10/2022 0142 02/10/2022 0945 Full Code [id number]  Etta Quill, DO ED   01/10/2019 1047 01/11/2019 2155 Full Code [id number]  Karmen Bongo, MD ED   06/16/2014 0658 06/17/2014 1902 Full Code [id number]  Jani Gravel, MD  ED   07/26/2013 0101 07/27/2013 1624 Full Code [id number]  Rise Patience, MD Inpatient       Prognosis:  < 2 weeks  Discharge Planning: Hospice facility  Care plan was discussed with primary RN, patient's family, Dr. Florene Glen, [facility]  Thank you for allowing the Palliative Medicine Team to assist in the care of this patient.  Lin Landsman, NP  Please contact Palliative Medicine Team phone at [phone] for questions and concerns.   *Portions of this note are a verbal dictation therefore any spelling and/or grammatical errors are due to the "[place] [employer]" system interpretation.

## 2022-02-12 NOTE — Progress Notes (Signed)
CSW sent referral to [facility] for review.   Gilmore Laroche, MSW, [facility]

## 2022-02-12 NOTE — Progress Notes (Addendum)
Manufacturing engineer [facility]) Hospital Liaison Note  Referral received for patient/family interest in [facility]. Chart under review by [facility] physician.   Hospice eligibility confirmed.  Bed offered and accepted for transfer today to [facility]. Unit RN please call report to [phone] prior to patient leaving the unit. Please send signed DNR and paperwork with patient.    Please call with any questions or concerns. Thank you  Roselee Nova, [place] [phone]

## 2022-02-12 NOTE — Plan of Care (Signed)

## 2022-02-12 NOTE — Assessment & Plan Note (Signed)
Appreciate palliative care Plan at this time is full comfort measures Plan for [facility] when bed available

## 2022-02-13 DIAGNOSIS — Z515 Encounter for palliative care: Secondary | ICD-10-CM | POA: Diagnosis not present

## 2022-02-13 NOTE — TOC Progression Note (Signed)
Transition of Care [facility]) - Progression Note    Patient Details  Name: Tamara Fry MRN: [id number] Date of Birth: 07/07/1946  Transition of Care [facility]) CM/SW [place], [place] Phone Number: 02/13/2022, 12:41 PM  Clinical Narrative:     Pt accepted to [facility], appropriate consents complete. PTAR called. RN, MD notified. Packet at [place]  Call Report: [phone]     Barriers to Discharge: Barriers Resolved  Expected Discharge Plan and Services           Expected Discharge Date: 02/13/22                                     Social Determinants of Health (SDOH) Interventions    Readmission Risk Interventions     No data to display

## 2022-02-13 NOTE — Plan of Care (Signed)

## 2022-02-13 NOTE — Progress Notes (Signed)
[facility])   Consent forms have been completed.  PTAR notified of patient D/C and transport arranged. TOC and Attending Physician also notified of transport arrangement.    Please send signed DNR form with patient and RN call report to [phone].    Daphene Calamity, MSW [facility] Liaison [phone]

## 2022-02-13 NOTE — TOC Transition Note (Signed)
Transition of Care [facility]) - CM/SW Discharge Note   Patient Details  Name: Tamara Fry MRN: [id number] Date of Birth: [birth year]  Transition of Care [facility]) CM/SW Contact:  Ina Homes, [place] Phone Number: 02/13/2022, 12:45 PM   Clinical Narrative:     Pt d/c to [facility]  Final next level of care: [place] Barriers to Discharge: Barriers Resolved   Patient Goals and CMS Choice   Discharge Placement              Patient chooses bed at:  [facility]) Patient to be transferred to facility by: PTAR   Patient and family notified of of transfer: 02/13/22  Discharge Plan and Services  Social Determinants of Health (SDOH) Interventions     Readmission Risk Interventions     No data to display

## 2022-02-13 NOTE — Plan of Care (Signed)
Problem: Education: Goal: Ability to describe self-care measures that may prevent or decrease complications (Diabetes Survival Skills Education) will improve 02/13/2022 1216 by Vonna Kotyk, RN Outcome: Adequate for Discharge 02/13/2022 0754 by Vonna Kotyk, RN Outcome: Progressing Goal: Individualized Educational Video(s) 02/13/2022 1216 by Vonna Kotyk, RN Outcome: Adequate for Discharge 02/13/2022 0754 by Vonna Kotyk, RN Outcome: Progressing   Problem: Coping: Goal: Ability to adjust to condition or change in health will improve 02/13/2022 1216 by Vonna Kotyk, RN Outcome: Adequate for Discharge 02/13/2022 0754 by Vonna Kotyk, RN Outcome: Progressing   Problem: Fluid Volume: Goal: Ability to maintain a balanced intake and output will improve 02/13/2022 1216 by Vonna Kotyk, RN Outcome: Adequate for Discharge 02/13/2022 0754 by Vonna Kotyk, RN Outcome: Progressing   Problem: Health Behavior/Discharge Planning: Goal: Ability to identify and utilize available resources and services will improve 02/13/2022 1216 by Vonna Kotyk, RN Outcome: Adequate for Discharge 02/13/2022 0754 by Vonna Kotyk, RN Outcome: Progressing Goal: Ability to manage health-related needs will improve 02/13/2022 1216 by Vonna Kotyk, RN Outcome: Adequate for Discharge 02/13/2022 0754 by Vonna Kotyk, RN Outcome: Progressing   Problem: Metabolic: Goal: Ability to maintain appropriate glucose levels will improve 02/13/2022 1216 by Vonna Kotyk, RN Outcome: Adequate for Discharge 02/13/2022 0754 by Vonna Kotyk, RN Outcome: Progressing   Problem: Nutritional: Goal: Maintenance of adequate nutrition will improve 02/13/2022 1216 by Vonna Kotyk, RN Outcome: Adequate for Discharge 02/13/2022 0754 by Vonna Kotyk, RN Outcome: Progressing Goal: Progress toward achieving an optimal weight will improve 02/13/2022 1216 by Vonna Kotyk, RN Outcome: Adequate for Discharge 02/13/2022 0754 by Vonna Kotyk,  RN Outcome: Progressing   Problem: Skin Integrity: Goal: Risk for impaired skin integrity will decrease 02/13/2022 1216 by Vonna Kotyk, RN Outcome: Adequate for Discharge 02/13/2022 0754 by Vonna Kotyk, RN Outcome: Progressing   Problem: Tissue Perfusion: Goal: Adequacy of tissue perfusion will improve 02/13/2022 1216 by Vonna Kotyk, RN Outcome: Adequate for Discharge 02/13/2022 0754 by Vonna Kotyk, RN Outcome: Progressing   Problem: Education: Goal: Knowledge of General Education information will improve Description: Including pain rating scale, medication(s)/side effects and non-pharmacologic comfort measures 02/13/2022 1216 by Vonna Kotyk, RN Outcome: Adequate for Discharge 02/13/2022 0754 by Vonna Kotyk, RN Outcome: Progressing   Problem: Health Behavior/Discharge Planning: Goal: Ability to manage health-related needs will improve 02/13/2022 1216 by Vonna Kotyk, RN Outcome: Adequate for Discharge 02/13/2022 0754 by Vonna Kotyk, RN Outcome: Progressing   Problem: Clinical Measurements: Goal: Ability to maintain clinical measurements within normal limits will improve 02/13/2022 1216 by Vonna Kotyk, RN Outcome: Adequate for Discharge 02/13/2022 0754 by Vonna Kotyk, RN Outcome: Progressing Goal: Will remain free from infection 02/13/2022 1216 by Vonna Kotyk, RN Outcome: Adequate for Discharge 02/13/2022 0754 by Vonna Kotyk, RN Outcome: Progressing Goal: Diagnostic test results will improve 02/13/2022 1216 by Vonna Kotyk, RN Outcome: Adequate for Discharge 02/13/2022 0754 by Vonna Kotyk, RN Outcome: Progressing Goal: Respiratory complications will improve 02/13/2022 1216 by Vonna Kotyk, RN Outcome: Adequate for Discharge 02/13/2022 0754 by Vonna Kotyk, RN Outcome: Progressing Goal: Cardiovascular complication will be avoided 02/13/2022 1216 by Vonna Kotyk, RN Outcome: Adequate for Discharge 02/13/2022 0754 by Vonna Kotyk, RN Outcome: Progressing   Problem:  Activity: Goal: Risk for activity intolerance will decrease 02/13/2022 1216 by Abie Cheek A, RN Outcome: Adequate for Discharge  02/13/2022 0754 by Vonna Kotyk, RN Outcome: Progressing   Problem: Nutrition: Goal: Adequate nutrition will be maintained 02/13/2022 1216 by Vonna Kotyk, RN Outcome: Adequate for Discharge 02/13/2022 0754 by Vonna Kotyk, RN Outcome: Progressing   Problem: Coping: Goal: Level of anxiety will decrease 02/13/2022 1216 by Vonna Kotyk, RN Outcome: Adequate for Discharge 02/13/2022 0754 by Vonna Kotyk, RN Outcome: Progressing   Problem: Elimination: Goal: Will not experience complications related to bowel motility 02/13/2022 1216 by Vonna Kotyk, RN Outcome: Adequate for Discharge 02/13/2022 0754 by Vonna Kotyk, RN Outcome: Progressing Goal: Will not experience complications related to urinary retention 02/13/2022 1216 by Vonna Kotyk, RN Outcome: Adequate for Discharge 02/13/2022 0754 by Vonna Kotyk, RN Outcome: Progressing   Problem: Pain Managment: Goal: General experience of comfort will improve 02/13/2022 1216 by Vonna Kotyk, RN Outcome: Adequate for Discharge 02/13/2022 0754 by Vonna Kotyk, RN Outcome: Progressing   Problem: Safety: Goal: Ability to remain free from injury will improve 02/13/2022 1216 by Vonna Kotyk, RN Outcome: Adequate for Discharge 02/13/2022 0754 by Vonna Kotyk, RN Outcome: Progressing   Problem: Skin Integrity: Goal: Risk for impaired skin integrity will decrease 02/13/2022 1216 by Vonna Kotyk, RN Outcome: Adequate for Discharge 02/13/2022 0754 by Vonna Kotyk, RN Outcome: Progressing

## 2022-02-13 NOTE — Progress Notes (Signed)
Discharge paperwork complete printed and sent with PTAR upon arrival to unit to transfer patient to [place]. Report called to [facility] by this RN.

## 2022-02-13 NOTE — Discharge Summary (Signed)
Physician Discharge Summary  Tamara Fry:[id number] DOB: 13-Nov-1945 DOA: 02/09/2022  PCP: Janie Morning, DO  Admit date: 02/09/2022 Discharge date: 02/13/2022  Time spent: 40 minutes  Recommendations for Outpatient Follow-up:  Discharge to hospice for comfort measures   Discharge Diagnoses:  Principal Problem:   Comfort measures only status Active Problems:   Goals of care, counseling/discussion   Leukocytosis   Septic shock (HCC)   Lactic acidosis   Hyperosmolar hyperglycemic state (HHS) (HCC)   Decubitus ulcer   Acute parotitis   Acute encephalopathy   AKI (acute kidney injury) ([place])   CKD (chronic kidney disease) stage 4, GFR 15-29 ml/min (HCC)   Hypernatremia   Essential hypertension   Persistent cognitive impairment   Failure to thrive in adult   Discharge Condition: stable  Diet recommendation: as tolerated, comfort   Filed Weights   02/10/22 1702  Weight: 51.9 kg    History of present illness:  Tamara Fry is Tamara Fry 76 y.o. female with medical history significant of advanced dementia, just started on hospice.  H/o CVA, T2DM, CKD IV, HTN, HLD, and multiple other medical issues here with hyperglycemia and worsening mental status.  Comfort measures at this point with her decline.  Plan for [facility].   See below for additional details  Hospital Course:  Assessment and Plan: * Comfort measures only status Appreciate palliative care Plan at this time is full comfort measures Plan for [facility] when bed available (today).  More alert today, difficult to understand, but speaking some, moving all extremities.  Discussed with son Eddie Dibbles, our overall plan hasn't changed at this time, plan remains discharge to [facility].   Goals of care, counseling/discussion Transitioned to DNR 6/28.   Comfort measures Palliative care c/s  Septic shock (HCC) Leukocytosis, tachycardia, tachypnea, intermittent hypotension, lactic >9 and concern for parotitis vs infected  decubitus ulcer (UTI and pneumonia other potential sources with UA and small R effusion).  Notably 2/2 cultures with staph epidermidis (repeat cultures pending), but comfort measures decided after palliative discussion.  Lactic acidosis Due to sepsis Follow with IVF Gradually downtrending  Decubitus ulcer Unstageable, suspect infected, foul smelling CT abd/pelvis without clear explanation for leukocytosis -> no well defined decubitus ulcer seen  Now comfort measures  Hyperosmolar hyperglycemic state (HHS) (HCC) Comfort measures  Acute parotitis Question of acute parotitis of R parotid gland    Acute encephalopathy Related to hyperglycemia, hypernatremia, and infection Head CT without acute abnormality  Comfort measures as above  AKI (acute kidney injury) ([place]) Creatinine improved Follow bladder scans CT without hydro   Hypernatremia Comfort measures  CKD (chronic kidney disease) stage 4, GFR 15-29 ml/min (HCC) Creat of 1.8 actually looks to be her baseline.  Essential hypertension Hold BP meds  Persistent cognitive impairment AAOx0 at baseline it sounds like But she's able to speak (nonsensical typically), declined as noted above See acute encephalopathy above. Current state represents decline from her baseline  Failure to thrive in adult        Procedures: none   Consultations: Palliative care  Discharge Exam: Vitals:   02/12/22 1210 02/12/22 2143  BP:  107/60  Pulse:    Resp:    Temp: 97.7 F (36.5 C) 97.6 F (36.4 C)  SpO2:     Asks where she is, says why repeatedly.  Difficult to communicate with.  Delirious. Discussed with son Eddie Dibbles, she's more awake today.  Asked if he wanted to come see her prior to moving her to hospice (  he declined).  Based on our discussion, goals remain the same at this time.   General: No acute distress. Lungs:  unlabored Neurological: talking more than previous days, some repetitive speech, difficult to  communicate with, moving all extremities Extremities: No clubbing or cyanosis. No edema.  Discharge Instructions   Discharge Instructions     Discharge instructions   Complete by: As directed    Discharge to hospice for comfort measures.   Discharge wound care:   Complete by: As directed    Cleanse the sacral wound with NS. Pat dry then apply Heidemarie Goodnow nickel thick layer of MediHoney directly to the wound or onto Kendale Rembold dressing. Make certain that the dressing covers the entire wound base, but not in contact with the peri-wound skin, then cover with dry gauze and secure with foam dressing. Change dressing daily or PRN soiling.  Protect bony prominences with foam dressings and turning q2hr.   Increase activity slowly   Complete by: As directed       Allergies as of 02/13/2022   No Known Allergies      Medication List     STOP taking these medications    acetaminophen 650 MG CR tablet Commonly known as: TYLENOL   atorvastatin 20 MG tablet Commonly known as: LIPITOR   donepezil 10 MG tablet Commonly known as: ARICEPT   insulin glargine 100 UNIT/ML injection Commonly known as: LANTUS   insulin lispro 100 UNIT/ML injection Commonly known as: HUMALOG   lactose free nutrition Liqd   nebivolol 10 MG tablet Commonly known as: BYSTOLIC   sitaGLIPtin 25 MG tablet Commonly known as: Januvia               Discharge Care Instructions  (From admission, onward)           Start     Ordered   02/13/22 0000  Discharge wound care:       Comments: Cleanse the sacral wound with NS. Pat dry then apply Mayrene Bastarache nickel thick layer of MediHoney directly to the wound or onto Nyeema Want dressing. Make certain that the dressing covers the entire wound base, but not in contact with the peri-wound skin, then cover with dry gauze and secure with foam dressing. Change dressing daily or PRN soiling.  Protect bony prominences with foam dressings and turning q2hr.   02/13/22 1204           No Known  Allergies    The results of significant diagnostics from this hospitalization (including imaging, microbiology, ancillary and laboratory) are listed below for reference.    Significant Diagnostic Studies: CT ABDOMEN PELVIS W CONTRAST  Result Date: 02/10/2022 CLINICAL DATA:  Hyperglycemia. Leukocytosis. Septic shock. Decubitus ulcer. EXAM: CT ABDOMEN AND PELVIS WITH CONTRAST TECHNIQUE: Multidetector CT imaging of the abdomen and pelvis was performed using the standard protocol following bolus administration of intravenous contrast. RADIATION DOSE REDUCTION: This exam was performed according to the departmental dose-optimization program which includes automated exposure control, adjustment of the mA and/or kV according to patient size and/or use of iterative reconstruction technique. CONTRAST:  138mL OMNIPAQUE IOHEXOL 350 MG/ML SOLN COMPARISON:  02/08/2013 from [facility] FINDINGS: Lower chest: Multifactorial degradation, including mild motion and patient arm position, not raised above the head. Clear lung bases. Normal heart size with tiny right pleural effusion. Hepatobiliary: Vague hyperattenuation or hyperenhancement within the high right hepatic lobe on 15/3 is favored to represent altered perfusion. Cholecystectomy. New common duct dilatation including at 1.4 cm on 29/3. No intrahepatic duct dilatation  or obstructive stone/mass. Pancreas: Normal, without mass or ductal dilatation. Spleen: Normal in size, without focal abnormality. Adrenals/Urinary Tract: Normal right adrenal gland. Mild left adrenal thickening and nodularity are new. Mild renal cortical thinning bilaterally. Interpolar right renal too small to characterize lesion. No hydronephrosis. Air within the urinary bladder. Stomach/Bowel: Normal stomach, without wall thickening. Normal colon and terminal ileum. Normal small bowel. Vascular/Lymphatic: Aortic atherosclerosis. No abdominopelvic adenopathy. Reproductive: Hysterectomy.  No  adnexal mass. Other: No significant free fluid. Moderate pelvic floor laxity. No free intraperitoneal air. Musculoskeletal: No well-defined decubitus ulcer. There is mild subcutaneous edema superficial the left proximal femur on 74/3. Moderate left and mild right hip osteoarthritis. IMPRESSION: 1. Motion and patient position degradation. 2. No explanation for leukocytosis. 3. Air within the urinary bladder.  Correlate with instrumentation. 4. Small right pleural effusion. 5.  Aortic Atherosclerosis (ICD10-I70.0). 6. Cholecystectomy with new common duct dilatation. No obstructive stone or mass identified. Consider correlation with bilirubin levels. Electronically Signed   By: Abigail Miyamoto M.D.   On: 02/10/2022 10:41   CT Maxillofacial Wo Contrast  Result Date: 02/10/2022 CLINICAL DATA:  Initial evaluation for right mandibular swelling. EXAM: CT MAXILLOFACIAL WITHOUT CONTRAST TECHNIQUE: Multidetector CT imaging of the maxillofacial structures was performed. Multiplanar CT image reconstructions were also generated. RADIATION DOSE REDUCTION: This exam was performed according to the departmental dose-optimization program which includes automated exposure control, adjustment of the mA and/or kV according to patient size and/or use of iterative reconstruction technique. COMPARISON:  None Available. FINDINGS: Osseous: No acute osseous abnormality. No discrete or worrisome osseous lesions. Orbits: Globes and orbital soft tissues within normal limits. Prior bilateral ocular lens replacement. Sinuses: Paranasal sinuses are largely clear. Mastoid air cells and middle ear cavities are well pneumatized and free of fluid. Soft tissues: Asymmetric enlargement with heterogeneous appearance of the right parotid gland, suspicious for acute parotitis. No obstructive stone seen within Stensen's duct. No discrete abscess or drainable fluid collection. Associated stranding noted within the overlying right parotid space, extending to  partially involve the right submandibular and parapharyngeal spaces. Right submandibular gland itself within normal limits. Mild asymmetric prominence of subcentimeter upper right cervical lymph nodes, likely reactive. Limited intracranial: Age-related cerebral atrophy with chronic small vessel ischemic disease. Otherwise unremarkable. IMPRESSION: Asymmetric enlargement with heterogeneous appearance of the right parotid gland, suspicious for acute parotitis. No obstructive stone. No discrete abscess or drainable fluid collection. Electronically Signed   By: Jeannine Boga M.D.   On: 02/10/2022 00:46   CT HEAD WO CONTRAST (5MM)  Result Date: 02/10/2022 CLINICAL DATA:  Delirium. EXAM: CT HEAD WITHOUT CONTRAST TECHNIQUE: Contiguous axial images were obtained from the base of the skull through the vertex without intravenous contrast. RADIATION DOSE REDUCTION: This exam was performed according to the departmental dose-optimization program which includes automated exposure control, adjustment of the mA and/or kV according to patient size and/or use of iterative reconstruction technique. COMPARISON:  Head CT 06/16/2014 FINDINGS: Brain: Progressive atrophy from 2015. Progressive periventricular chronic small vessel ischemic change. No hemorrhage or evidence of acute ischemia. No subdural or extra-axial collection. No midline shift, mass lesion or mass effect. Bifrontal atrophy but no extra-axial collection. Enlarged partially empty sella. Vascular: Atherosclerosis of skullbase vasculature without hyperdense vessel or abnormal calcification. Skull: No fracture or focal lesion. Incidental frontal hyperostosis. Sinuses/Orbits: Paranasal sinuses and mastoid air cells are clear. The visualized orbits are unremarkable. Other: None. IMPRESSION: 1. No acute intracranial abnormality. 2. Progressive atrophy from 2015. Progressive chronic small vessel ischemic change. Electronically Signed  By: Keith Rake M.D.   On:  02/10/2022 00:17   DG Chest 2 View  Result Date: 02/09/2022 CLINICAL DATA:  Altered mental status. EXAM: CHEST - 2 VIEW COMPARISON:  12/10/2015 FINDINGS: Mild patient rotation with anti lordotic positioning, difficulty positioning due to medical condition.The cardiomediastinal contours are normal. Skin folds project over both hemi thoraces. Pulmonary vasculature is normal. No consolidation, pleural effusion, or pneumothorax. No acute osseous abnormalities are seen. IMPRESSION: No acute chest findings. Electronically Signed   By: Keith Rake M.D.   On: 02/09/2022 22:52    Microbiology: Recent Results (from the past 240 hour(s))  Blood culture (routine x 2)     Status: Abnormal (Preliminary result)   Collection Time: 02/09/22 10:56 PM   Specimen: BLOOD RIGHT ARM  Result Value Ref Range Status   Specimen Description BLOOD RIGHT ARM  Final   Special Requests   Final    BOTTLES DRAWN AEROBIC AND ANAEROBIC Blood Culture results may not be optimal due to an inadequate volume of blood received in culture bottles   Culture  Setup Time   Final    GRAM POSITIVE COCCI IN CLUSTERS ANAEROBIC BOTTLE ONLY CRITICAL RESULT CALLED TO, READ BACK BY AND VERIFIED WITH: PHARMD K. AMEND [phone] @2114  FH    Culture ([place])  Final    STAPHYLOCOCCUS EPIDERMIDIS REPEATING SUSCEPTIBILITY TESTING Performed at [facility], [place] [address] [place], [place] [address]    Report Status PENDING  Incomplete  Blood Culture ID Panel (Reflexed)     Status: Abnormal   Collection Time: 02/09/22 10:56 PM  Result Value Ref Range Status   Enterococcus faecalis NOT DETECTED NOT DETECTED Final   Enterococcus Faecium NOT DETECTED NOT DETECTED Final   Listeria monocytogenes NOT DETECTED NOT DETECTED Final   Staphylococcus species DETECTED (Franchot Pollitt) NOT DETECTED Final    Comment: CRITICAL RESULT CALLED TO, READ BACK BY AND VERIFIED WITH: PHARMD K. AMEND [id number] @2114  FH    Staphylococcus aureus (BCID) NOT DETECTED NOT DETECTED  Final   Staphylococcus epidermidis DETECTED (Shayana Hornstein) NOT DETECTED Final    Comment: Methicillin (oxacillin) resistant coagulase negative staphylococcus. Possible blood culture contaminant (unless isolated from more than one blood culture draw or clinical case suggests pathogenicity). No antibiotic treatment is indicated for blood  culture contaminants. CRITICAL RESULT CALLED TO, READ BACK BY AND VERIFIED WITH: PHARMD K. AMEND [phone] @2114  FH    Staphylococcus lugdunensis NOT DETECTED NOT DETECTED Final   Streptococcus species NOT DETECTED NOT DETECTED Final   Streptococcus agalactiae NOT DETECTED NOT DETECTED Final   Streptococcus pneumoniae NOT DETECTED NOT DETECTED Final   Streptococcus pyogenes NOT DETECTED NOT DETECTED Final   Idelle Reimann.calcoaceticus-baumannii NOT DETECTED NOT DETECTED Final   Bacteroides fragilis NOT DETECTED NOT DETECTED Final   Enterobacterales NOT DETECTED NOT DETECTED Final   Enterobacter cloacae complex NOT DETECTED NOT DETECTED Final   Escherichia coli NOT DETECTED NOT DETECTED Final   Klebsiella aerogenes NOT DETECTED NOT DETECTED Final   Klebsiella oxytoca NOT DETECTED NOT DETECTED Final   Klebsiella pneumoniae NOT DETECTED NOT DETECTED Final   Proteus species NOT DETECTED NOT DETECTED Final   Salmonella species NOT DETECTED NOT DETECTED Final   Serratia marcescens NOT DETECTED NOT DETECTED Final   Haemophilus influenzae NOT DETECTED NOT DETECTED Final   Neisseria meningitidis NOT DETECTED NOT DETECTED Final   Pseudomonas aeruginosa NOT DETECTED NOT DETECTED Final   Stenotrophomonas maltophilia NOT DETECTED NOT DETECTED Final   Candida albicans NOT DETECTED NOT DETECTED Final  Candida auris NOT DETECTED NOT DETECTED Final   Candida glabrata NOT DETECTED NOT DETECTED Final   Candida krusei NOT DETECTED NOT DETECTED Final   Candida parapsilosis NOT DETECTED NOT DETECTED Final   Candida tropicalis NOT DETECTED NOT DETECTED Final   Cryptococcus neoformans/gattii NOT  DETECTED NOT DETECTED Final   Methicillin resistance mecA/C DETECTED (Ayse Mccartin) NOT DETECTED Final    Comment: CRITICAL RESULT CALLED TO, READ BACK BY AND VERIFIED WITH: PHARMD K. AMEND [phone] @2114  FH Performed at [facility], [place] [address] [place], [place] [address]   Blood culture (routine x 2)     Status: Abnormal (Preliminary result)   Collection Time: 02/09/22 11:15 PM   Specimen: BLOOD LEFT ARM  Result Value Ref Range Status   Specimen Description BLOOD LEFT ARM  Final   Special Requests   Final    BOTTLES DRAWN AEROBIC ONLY Blood Culture adequate volume   Culture  Setup Time   Final    GRAM POSITIVE COCCI IN CLUSTERS AEROBIC BOTTLE ONLY CRITICAL RESULT CALLED TO, READ BACK BY AND VERIFIED WITH: PHARMD ANDREW MEYER ON 02/11/22 @ 1601 BY DRT Performed at [facility], [place] [address], [place], [place] [address]    Culture STAPHYLOCOCCUS EPIDERMIDIS (Anihya Tuma)  Final   Report Status PENDING  Incomplete  MRSA Next Gen by PCR, Nasal     Status: None   Collection Time: 02/10/22  3:40 AM   Specimen: Nasal Mucosa; Nasal Swab  Result Value Ref Range Status   MRSA by PCR Next Gen NOT DETECTED NOT DETECTED Final    Comment: (NOTE) The GeneXpert MRSA Assay (FDA approved for NASAL specimens only), is one component of [facility] comprehensive MRSA colonization surveillance program. It is not intended to diagnose MRSA infection nor to guide or monitor treatment for MRSA infections. Test performance is not FDA approved in patients less than 28 years old. Performed at [facility], [place] [address], [place], [place] [address]   Urine Culture     Status: Abnormal   Collection Time: 02/10/22 12:30 PM   Specimen: In/Out Cath Urine  Result Value Ref Range Status   Specimen Description IN/OUT CATH URINE  Final   Special Requests   Final    NONE Performed at [facility], [place] [address], [place], [place] [address]    Culture MULTIPLE SPECIES PRESENT, SUGGEST RECOLLECTION (Jaidon Ellery)  Final    Report Status 02/12/2022 FINAL  Final  Culture, blood (Routine X 2) w Reflex to ID Panel     Status: None (Preliminary result)   Collection Time: 02/11/22  7:30 AM   Specimen: BLOOD  Result Value Ref Range Status   Specimen Description BLOOD LEFT ANTECUBITAL  Final   Special Requests   Final    BOTTLES DRAWN AEROBIC AND ANAEROBIC Blood Culture adequate volume   Culture   Final    NO GROWTH 2 DAYS Performed at [facility], [place] [address] [place], [place] [address]    Report Status PENDING  Incomplete  Culture, blood (Routine X 2) w Reflex to ID Panel     Status: None (Preliminary result)   Collection Time: 02/11/22  7:39 AM   Specimen: BLOOD LEFT FOREARM  Result Value Ref Range Status   Specimen Description BLOOD LEFT FOREARM  Final   Special Requests   Final    BOTTLES DRAWN AEROBIC ONLY Blood Culture results may not be optimal due to an inadequate volume of blood received in culture  bottles   Culture   Final    NO GROWTH 2 DAYS Performed at [facility], [place] [address], [place], [place] [address]    Report Status PENDING  Incomplete     Labs: Basic Metabolic Panel: Recent Labs  Lab 02/10/22 0330 02/10/22 0651 02/10/22 1550 02/10/22 1944 02/11/22 0139  NA 148* 150* 146* 144 148*  K 4.7 4.0 4.4 4.5 4.2  CL 111 113* 112* 114* 114*  CO2 24 25 25  18* 23  GLUCOSE 355* 198* 180* 183* 167*  BUN 49* 48* 41* 33* 37*  CREATININE 1.50* 1.28* 1.17* 0.76 0.99  CALCIUM 9.6 9.7 9.3 8.4* 9.1   Liver Function Tests: Recent Labs  Lab 02/09/22 2304  AST 27  ALT 18  ALKPHOS 116  BILITOT 0.9  PROT 6.2*  ALBUMIN 2.4*   No results for input(s): "LIPASE", "AMYLASE" in the last 168 hours. No results for input(s): "AMMONIA" in the last 168 hours. CBC: Recent Labs  Lab 02/09/22 2304 02/09/22 2321 02/11/22 0139  WBC 22.4*  --  20.2*  NEUTROABS 19.3*  --   --   HGB 12.6 12.6 10.6*  HCT 39.7 37.0 35.7*  MCV 95.9  --  100.0  PLT 274  --  192   Cardiac  Enzymes: No results for input(s): "CKTOTAL", "CKMB", "CKMBINDEX", "TROPONINI" in the last 168 hours. BNP: BNP (last 3 results) No results for input(s): "BNP" in the last 8760 hours.  ProBNP (last 3 results) No results for input(s): "PROBNP" in the last 8760 hours.  CBG: Recent Labs  Lab 02/11/22 0111 02/11/22 0531 02/11/22 0819 02/11/22 1151 02/11/22 1616  GLUCAP 138* 97 60* 81 51*       Signed:  Fayrene Helper MD.  [facility] 02/13/2022, 12:09 PM

## 2022-02-14 LAB — CULTURE, BLOOD (ROUTINE X 2): Special Requests: ADEQUATE

## 2022-02-16 LAB — CULTURE, BLOOD (ROUTINE X 2)
Culture: NO GROWTH
Culture: NO GROWTH
Special Requests: ADEQUATE

## 2022-03-16 DEATH — deceased
